# Patient Record
Sex: Male | Born: 1960 | Race: Black or African American | Hispanic: No | Marital: Married | Smoking: Former smoker
Health system: Southern US, Community
[De-identification: ages and names within clinical notes are randomized; demographics above are authoritative.]

## PROBLEM LIST (undated history)

## (undated) DIAGNOSIS — I509 Heart failure, unspecified: Secondary | ICD-10-CM

## (undated) DIAGNOSIS — E785 Hyperlipidemia, unspecified: Secondary | ICD-10-CM

## (undated) DIAGNOSIS — J189 Pneumonia, unspecified organism: Secondary | ICD-10-CM

## (undated) DIAGNOSIS — I10 Essential (primary) hypertension: Secondary | ICD-10-CM

## (undated) DIAGNOSIS — E119 Type 2 diabetes mellitus without complications: Secondary | ICD-10-CM

## (undated) HISTORY — PX: NO PAST SURGERIES: SHX2092

---

## 2002-11-24 ENCOUNTER — Emergency Department (HOSPITAL_COMMUNITY): Admission: EM | Admit: 2002-11-24 | Discharge: 2002-11-24 | Payer: Self-pay | Admitting: Internal Medicine

## 2005-01-26 ENCOUNTER — Emergency Department (HOSPITAL_COMMUNITY): Admission: EM | Admit: 2005-01-26 | Discharge: 2005-01-26 | Payer: Self-pay | Admitting: Emergency Medicine

## 2005-06-11 ENCOUNTER — Emergency Department (HOSPITAL_COMMUNITY): Admission: EM | Admit: 2005-06-11 | Discharge: 2005-06-11 | Payer: Self-pay | Admitting: Emergency Medicine

## 2005-11-03 ENCOUNTER — Emergency Department (HOSPITAL_COMMUNITY): Admission: EM | Admit: 2005-11-03 | Discharge: 2005-11-03 | Payer: Self-pay | Admitting: Emergency Medicine

## 2006-04-14 ENCOUNTER — Encounter (INDEPENDENT_AMBULATORY_CARE_PROVIDER_SITE_OTHER): Payer: Self-pay | Admitting: Family Medicine

## 2006-04-14 LAB — CONVERTED CEMR LAB: Blood Glucose, Fasting: 118 mg/dL

## 2006-04-24 ENCOUNTER — Encounter (INDEPENDENT_AMBULATORY_CARE_PROVIDER_SITE_OTHER): Payer: Self-pay | Admitting: Family Medicine

## 2006-04-24 LAB — CONVERTED CEMR LAB: PSA: 1.22 ng/mL

## 2006-08-04 ENCOUNTER — Ambulatory Visit: Payer: Self-pay | Admitting: Family Medicine

## 2006-09-01 ENCOUNTER — Ambulatory Visit: Payer: Self-pay | Admitting: Family Medicine

## 2006-10-12 ENCOUNTER — Encounter: Payer: Self-pay | Admitting: Family Medicine

## 2006-10-12 DIAGNOSIS — I10 Essential (primary) hypertension: Secondary | ICD-10-CM | POA: Insufficient documentation

## 2006-10-12 DIAGNOSIS — E782 Mixed hyperlipidemia: Secondary | ICD-10-CM | POA: Insufficient documentation

## 2006-10-12 DIAGNOSIS — K449 Diaphragmatic hernia without obstruction or gangrene: Secondary | ICD-10-CM | POA: Insufficient documentation

## 2006-10-12 DIAGNOSIS — E785 Hyperlipidemia, unspecified: Secondary | ICD-10-CM | POA: Insufficient documentation

## 2006-10-12 DIAGNOSIS — K219 Gastro-esophageal reflux disease without esophagitis: Secondary | ICD-10-CM | POA: Insufficient documentation

## 2006-10-12 DIAGNOSIS — E669 Obesity, unspecified: Secondary | ICD-10-CM | POA: Insufficient documentation

## 2006-11-03 ENCOUNTER — Ambulatory Visit: Payer: Self-pay | Admitting: Family Medicine

## 2007-01-15 ENCOUNTER — Encounter (INDEPENDENT_AMBULATORY_CARE_PROVIDER_SITE_OTHER): Payer: Self-pay | Admitting: Family Medicine

## 2007-01-24 ENCOUNTER — Encounter (INDEPENDENT_AMBULATORY_CARE_PROVIDER_SITE_OTHER): Payer: Self-pay | Admitting: Family Medicine

## 2007-03-13 ENCOUNTER — Encounter (INDEPENDENT_AMBULATORY_CARE_PROVIDER_SITE_OTHER): Payer: Self-pay | Admitting: Family Medicine

## 2007-03-16 ENCOUNTER — Ambulatory Visit: Payer: Self-pay | Admitting: Family Medicine

## 2007-03-16 LAB — CONVERTED CEMR LAB
Cholesterol, target level: 200 mg/dL
HDL goal, serum: 40 mg/dL
LDL Goal: 160 mg/dL

## 2007-03-19 LAB — CONVERTED CEMR LAB
ALT: 21 units/L (ref 0–53)
AST: 20 units/L (ref 0–37)
Albumin: 4 g/dL (ref 3.5–5.2)
Alkaline Phosphatase: 70 units/L (ref 39–117)
BUN: 17 mg/dL (ref 6–23)
CO2: 20 meq/L (ref 19–32)
Calcium: 9.8 mg/dL (ref 8.4–10.5)
Chloride: 107 meq/L (ref 96–112)
Creatinine, Ser: 1.19 mg/dL (ref 0.40–1.50)
Glucose, Bld: 104 mg/dL — ABNORMAL HIGH (ref 70–99)
PSA: 1.11 ng/mL (ref 0.10–4.00)
Potassium: 3.8 meq/L (ref 3.5–5.3)
Sodium: 141 meq/L (ref 135–145)
TSH: 2.203 microintl units/mL (ref 0.350–5.50)
Total Bilirubin: 0.3 mg/dL (ref 0.3–1.2)
Total Protein: 7 g/dL (ref 6.0–8.3)

## 2007-04-20 ENCOUNTER — Ambulatory Visit: Payer: Self-pay | Admitting: Family Medicine

## 2007-06-22 ENCOUNTER — Ambulatory Visit: Payer: Self-pay | Admitting: Family Medicine

## 2007-06-28 ENCOUNTER — Encounter (INDEPENDENT_AMBULATORY_CARE_PROVIDER_SITE_OTHER): Payer: Self-pay | Admitting: Family Medicine

## 2007-06-29 ENCOUNTER — Telehealth (INDEPENDENT_AMBULATORY_CARE_PROVIDER_SITE_OTHER): Payer: Self-pay | Admitting: *Deleted

## 2007-06-29 LAB — CONVERTED CEMR LAB
ALT: 17 units/L (ref 0–53)
AST: 16 units/L (ref 0–37)
Albumin: 4 g/dL (ref 3.5–5.2)
Alkaline Phosphatase: 61 units/L (ref 39–117)
BUN: 18 mg/dL (ref 6–23)
CO2: 25 meq/L (ref 19–32)
Calcium: 9.8 mg/dL (ref 8.4–10.5)
Chloride: 105 meq/L (ref 96–112)
Cholesterol: 140 mg/dL (ref 0–200)
Creatinine, Ser: 1.13 mg/dL (ref 0.40–1.50)
Glucose, Bld: 79 mg/dL (ref 70–99)
HDL: 43 mg/dL (ref >39)
LDL Cholesterol: 75 mg/dL (ref 0–99)
Potassium: 4.2 meq/L (ref 3.5–5.3)
Sodium: 143 meq/L (ref 135–145)
Total Bilirubin: 0.5 mg/dL (ref 0.3–1.2)
Total CHOL/HDL Ratio: 3.3
Total Protein: 7 g/dL (ref 6.0–8.3)
Triglycerides: 109 mg/dL (ref <150)
VLDL: 22 mg/dL (ref 0–40)

## 2007-09-08 ENCOUNTER — Emergency Department (HOSPITAL_COMMUNITY): Admission: EM | Admit: 2007-09-08 | Discharge: 2007-09-08 | Payer: Self-pay | Admitting: Emergency Medicine

## 2007-09-21 ENCOUNTER — Ambulatory Visit: Payer: Self-pay | Admitting: Family Medicine

## 2007-09-21 DIAGNOSIS — M109 Gout, unspecified: Secondary | ICD-10-CM | POA: Insufficient documentation

## 2007-09-21 LAB — CONVERTED CEMR LAB: LDL Goal: 130 mg/dL

## 2007-09-22 ENCOUNTER — Encounter (INDEPENDENT_AMBULATORY_CARE_PROVIDER_SITE_OTHER): Payer: Self-pay | Admitting: Family Medicine

## 2007-09-24 ENCOUNTER — Telehealth (INDEPENDENT_AMBULATORY_CARE_PROVIDER_SITE_OTHER): Payer: Self-pay | Admitting: Family Medicine

## 2007-09-24 LAB — CONVERTED CEMR LAB: Uric Acid, Serum: 9.4 mg/dL — ABNORMAL HIGH (ref 2.4–7.0)

## 2007-12-21 ENCOUNTER — Ambulatory Visit: Payer: Self-pay | Admitting: Family Medicine

## 2007-12-22 ENCOUNTER — Encounter (INDEPENDENT_AMBULATORY_CARE_PROVIDER_SITE_OTHER): Payer: Self-pay | Admitting: Family Medicine

## 2007-12-25 ENCOUNTER — Telehealth (INDEPENDENT_AMBULATORY_CARE_PROVIDER_SITE_OTHER): Payer: Self-pay | Admitting: *Deleted

## 2007-12-25 LAB — CONVERTED CEMR LAB
ALT: 18 units/L (ref 0–53)
AST: 19 units/L (ref 0–37)
Albumin: 4.1 g/dL (ref 3.5–5.2)
Alkaline Phosphatase: 64 units/L (ref 39–117)
BUN: 17 mg/dL (ref 6–23)
Basophils Absolute: 0 10*3/uL (ref 0.0–0.1)
Basophils Relative: 0 % (ref 0–1)
CO2: 26 meq/L (ref 19–32)
Calcium: 10.2 mg/dL (ref 8.4–10.5)
Chloride: 103 meq/L (ref 96–112)
Cholesterol: 154 mg/dL (ref 0–200)
Creatinine, Ser: 1.17 mg/dL (ref 0.40–1.50)
Eosinophils Absolute: 0.2 10*3/uL (ref 0.0–0.7)
Eosinophils Relative: 3 % (ref 0–5)
Glucose, Bld: 90 mg/dL (ref 70–99)
HCT: 46.4 % (ref 39.0–52.0)
HDL: 41 mg/dL (ref >39)
Hemoglobin: 14.3 g/dL (ref 13.0–17.0)
LDL Cholesterol: 77 mg/dL (ref 0–99)
Lymphocytes Relative: 40 % (ref 12–46)
Lymphs Abs: 3 10*3/uL (ref 0.7–4.0)
MCHC: 30.8 g/dL (ref 30.0–36.0)
MCV: 96.7 fL (ref 78.0–100.0)
Monocytes Absolute: 0.6 10*3/uL (ref 0.1–1.0)
Monocytes Relative: 8 % (ref 3–12)
Neutro Abs: 3.7 10*3/uL (ref 1.7–7.7)
Neutrophils Relative %: 50 % (ref 43–77)
Platelets: 172 10*3/uL (ref 150–400)
Potassium: 4.1 meq/L (ref 3.5–5.3)
RBC: 4.8 M/uL (ref 4.22–5.81)
RDW: 13.9 % (ref 11.5–15.5)
Sodium: 140 meq/L (ref 135–145)
TSH: 2.134 microintl units/mL (ref 0.350–5.50)
Total Bilirubin: 0.5 mg/dL (ref 0.3–1.2)
Total CHOL/HDL Ratio: 3.8
Total Protein: 7.4 g/dL (ref 6.0–8.3)
Triglycerides: 180 mg/dL — ABNORMAL HIGH (ref <150)
VLDL: 36 mg/dL (ref 0–40)
WBC: 7.5 10*3/uL (ref 4.0–10.5)

## 2008-01-01 ENCOUNTER — Encounter (INDEPENDENT_AMBULATORY_CARE_PROVIDER_SITE_OTHER): Payer: Self-pay | Admitting: Family Medicine

## 2008-01-04 ENCOUNTER — Encounter (INDEPENDENT_AMBULATORY_CARE_PROVIDER_SITE_OTHER): Payer: Self-pay | Admitting: Family Medicine

## 2008-01-05 ENCOUNTER — Emergency Department (HOSPITAL_COMMUNITY): Admission: EM | Admit: 2008-01-05 | Discharge: 2008-01-05 | Payer: Self-pay | Admitting: Emergency Medicine

## 2008-01-26 ENCOUNTER — Ambulatory Visit: Admission: RE | Admit: 2008-01-26 | Discharge: 2008-01-26 | Payer: Self-pay | Admitting: Family Medicine

## 2008-01-31 ENCOUNTER — Telehealth (INDEPENDENT_AMBULATORY_CARE_PROVIDER_SITE_OTHER): Payer: Self-pay | Admitting: Family Medicine

## 2008-02-12 ENCOUNTER — Ambulatory Visit: Payer: Self-pay | Admitting: Pulmonary Disease

## 2008-02-26 ENCOUNTER — Telehealth (INDEPENDENT_AMBULATORY_CARE_PROVIDER_SITE_OTHER): Payer: Self-pay | Admitting: *Deleted

## 2008-03-21 ENCOUNTER — Ambulatory Visit: Payer: Self-pay | Admitting: Family Medicine

## 2008-06-27 ENCOUNTER — Ambulatory Visit: Payer: Self-pay | Admitting: Family Medicine

## 2008-06-27 DIAGNOSIS — H6120 Impacted cerumen, unspecified ear: Secondary | ICD-10-CM | POA: Insufficient documentation

## 2008-06-27 LAB — CONVERTED CEMR LAB
Bilirubin Urine: NEGATIVE
Blood in Urine, dipstick: NEGATIVE
Glucose, Urine, Semiquant: NEGATIVE
Ketones, urine, test strip: NEGATIVE
Nitrite: NEGATIVE
Protein, U semiquant: NEGATIVE
Specific Gravity, Urine: 1.02
Urobilinogen, UA: 2
WBC Urine, dipstick: NEGATIVE
pH: 5

## 2008-06-28 ENCOUNTER — Encounter (INDEPENDENT_AMBULATORY_CARE_PROVIDER_SITE_OTHER): Payer: Self-pay | Admitting: Family Medicine

## 2008-06-30 LAB — CONVERTED CEMR LAB
ALT: 21 units/L (ref 0–53)
AST: 19 units/L (ref 0–37)
Albumin: 4.1 g/dL (ref 3.5–5.2)
Alkaline Phosphatase: 61 units/L (ref 39–117)
BUN: 16 mg/dL (ref 6–23)
Basophils Absolute: 0 10*3/uL (ref 0.0–0.1)
Basophils Relative: 0 % (ref 0–1)
CO2: 26 meq/L (ref 19–32)
Calcium: 9.5 mg/dL (ref 8.4–10.5)
Chloride: 103 meq/L (ref 96–112)
Cholesterol: 161 mg/dL (ref 0–200)
Creatinine, Ser: 1.21 mg/dL (ref 0.40–1.50)
Eosinophils Absolute: 0.2 10*3/uL (ref 0.0–0.7)
Eosinophils Relative: 3 % (ref 0–5)
Glucose, Bld: 94 mg/dL (ref 70–99)
HCT: 46.3 % (ref 39.0–52.0)
HDL: 43 mg/dL (ref >39)
Hemoglobin: 14.9 g/dL (ref 13.0–17.0)
LDL Cholesterol: 92 mg/dL (ref 0–99)
Lymphocytes Relative: 38 % (ref 12–46)
Lymphs Abs: 2.9 10*3/uL (ref 0.7–4.0)
MCHC: 32.2 g/dL (ref 30.0–36.0)
MCV: 96.7 fL (ref 78.0–100.0)
Monocytes Absolute: 0.8 10*3/uL (ref 0.1–1.0)
Monocytes Relative: 11 % (ref 3–12)
Neutro Abs: 3.6 10*3/uL (ref 1.7–7.7)
Neutrophils Relative %: 48 % (ref 43–77)
PSA: 1.15 ng/mL (ref 0.10–4.00)
Platelets: 162 10*3/uL (ref 150–400)
Potassium: 4 meq/L (ref 3.5–5.3)
RBC: 4.79 M/uL (ref 4.22–5.81)
RDW: 14.3 % (ref 11.5–15.5)
Sodium: 142 meq/L (ref 135–145)
Total Bilirubin: 0.6 mg/dL (ref 0.3–1.2)
Total CHOL/HDL Ratio: 3.7
Total Protein: 7.2 g/dL (ref 6.0–8.3)
Triglycerides: 131 mg/dL (ref <150)
VLDL: 26 mg/dL (ref 0–40)
WBC: 7.5 10*3/uL (ref 4.0–10.5)

## 2008-08-29 ENCOUNTER — Ambulatory Visit: Payer: Self-pay | Admitting: Family Medicine

## 2008-08-29 DIAGNOSIS — F528 Other sexual dysfunction not due to a substance or known physiological condition: Secondary | ICD-10-CM | POA: Insufficient documentation

## 2008-11-28 ENCOUNTER — Ambulatory Visit: Payer: Self-pay | Admitting: Family Medicine

## 2008-12-02 LAB — CONVERTED CEMR LAB
ALT: 21 units/L (ref 0–53)
AST: 21 units/L (ref 0–37)
Albumin: 4.4 g/dL (ref 3.5–5.2)
Alkaline Phosphatase: 72 units/L (ref 39–117)
BUN: 17 mg/dL (ref 6–23)
Basophils Absolute: 0 10*3/uL (ref 0.0–0.1)
Basophils Relative: 0 % (ref 0–1)
CO2: 24 meq/L (ref 19–32)
Calcium: 9.9 mg/dL (ref 8.4–10.5)
Chloride: 103 meq/L (ref 96–112)
Creatinine, Ser: 1.04 mg/dL (ref 0.40–1.50)
Eosinophils Absolute: 0.3 10*3/uL (ref 0.0–0.7)
Eosinophils Relative: 3 % (ref 0–5)
Glucose, Bld: 88 mg/dL (ref 70–99)
HCT: 45.1 % (ref 39.0–52.0)
Hemoglobin: 15.2 g/dL (ref 13.0–17.0)
Lymphocytes Relative: 37 % (ref 12–46)
Lymphs Abs: 3.5 10*3/uL (ref 0.7–4.0)
MCHC: 33.7 g/dL (ref 30.0–36.0)
MCV: 94 fL (ref 78.0–100.0)
Monocytes Absolute: 0.8 10*3/uL (ref 0.1–1.0)
Monocytes Relative: 8 % (ref 3–12)
Neutro Abs: 4.8 10*3/uL (ref 1.7–7.7)
Neutrophils Relative %: 51 % (ref 43–77)
Platelets: 174 10*3/uL (ref 150–400)
Potassium: 3.9 meq/L (ref 3.5–5.3)
RBC: 4.8 M/uL (ref 4.22–5.81)
RDW: 13.5 % (ref 11.5–15.5)
Sodium: 141 meq/L (ref 135–145)
Total Bilirubin: 0.4 mg/dL (ref 0.3–1.2)
Total Protein: 7.7 g/dL (ref 6.0–8.3)
Uric Acid, Serum: 7.7 mg/dL (ref 4.0–7.8)
WBC: 9.4 10*3/uL (ref 4.0–10.5)

## 2009-01-24 ENCOUNTER — Emergency Department (HOSPITAL_COMMUNITY): Admission: EM | Admit: 2009-01-24 | Discharge: 2009-01-24 | Payer: Self-pay | Admitting: Emergency Medicine

## 2009-04-10 ENCOUNTER — Ambulatory Visit: Payer: Self-pay | Admitting: Family Medicine

## 2009-04-11 ENCOUNTER — Encounter (INDEPENDENT_AMBULATORY_CARE_PROVIDER_SITE_OTHER): Payer: Self-pay | Admitting: Family Medicine

## 2009-04-13 LAB — CONVERTED CEMR LAB
ALT: 20 units/L (ref 0–53)
AST: 19 units/L (ref 0–37)
Albumin: 4.2 g/dL (ref 3.5–5.2)
Alkaline Phosphatase: 66 units/L (ref 39–117)
BUN: 18 mg/dL (ref 6–23)
Basophils Absolute: 0 10*3/uL (ref 0.0–0.1)
Basophils Relative: 0 % (ref 0–1)
CO2: 20 meq/L (ref 19–32)
Calcium: 9.8 mg/dL (ref 8.4–10.5)
Chloride: 108 meq/L (ref 96–112)
Creatinine, Ser: 1.16 mg/dL (ref 0.40–1.50)
Eosinophils Absolute: 0.2 10*3/uL (ref 0.0–0.7)
Eosinophils Relative: 2 % (ref 0–5)
Glucose, Bld: 102 mg/dL — ABNORMAL HIGH (ref 70–99)
HCT: 43.4 % (ref 39.0–52.0)
Hemoglobin: 14.3 g/dL (ref 13.0–17.0)
Lymphocytes Relative: 35 % (ref 12–46)
Lymphs Abs: 3.4 10*3/uL (ref 0.7–4.0)
MCHC: 32.9 g/dL (ref 30.0–36.0)
MCV: 91.4 fL (ref 78.0–100.0)
Monocytes Absolute: 0.7 10*3/uL (ref 0.1–1.0)
Monocytes Relative: 7 % (ref 3–12)
Neutro Abs: 5.3 10*3/uL (ref 1.7–7.7)
Neutrophils Relative %: 55 % (ref 43–77)
Platelets: 157 10*3/uL (ref 150–400)
Potassium: 3.7 meq/L (ref 3.5–5.3)
RBC: 4.75 M/uL (ref 4.22–5.81)
RDW: 13.6 % (ref 11.5–15.5)
Sodium: 142 meq/L (ref 135–145)
Total Bilirubin: 0.3 mg/dL (ref 0.3–1.2)
Total Protein: 7.5 g/dL (ref 6.0–8.3)
Uric Acid, Serum: 9.6 mg/dL — ABNORMAL HIGH (ref 4.0–7.8)
WBC: 9.6 10*3/uL (ref 4.0–10.5)

## 2009-07-17 ENCOUNTER — Ambulatory Visit: Payer: Self-pay | Admitting: Family Medicine

## 2009-11-07 ENCOUNTER — Emergency Department (HOSPITAL_COMMUNITY): Admission: EM | Admit: 2009-11-07 | Discharge: 2009-11-07 | Payer: Self-pay | Admitting: Emergency Medicine

## 2010-12-07 NOTE — Assessment & Plan Note (Signed)
Summary: follow up 1 month/slj   Vital Signs:  Patient Profile:   50 Years Old Male Height:     68 inches (172.72 cm) Weight:      276 pounds BMI:     42.12 O2 Sat:      99 % Pulse rate:   81 / minute Resp:     12 per minute BP sitting:   131 / 81  Vitals Entered By: Sherilyn Banker (November 28, 2008 3:51 PM)                 PCP:  Franchot Heidelberg, MD  Chief Complaint:  sinus congestion, ear pain, and sore throat.  History of Present Illness: Pt in for recheck.  He notes he is doing well. He states has a cold. He states started Monday. He states he has low grade temp and has not checked it but states felt hot. He denies chills and sweats. He has dull headache and notes all over. Ears are stuffy and achy. His nose is congested with yellow mucous. He denies watery eyes and has sneezed a little bit. He has PND and states tickle in throat. He is coughing and states sma estuff as in nose comes up. Felt aweful yesterday and better today. He denies chest pain, SOB, orthoonea, PND and palpitations. Denies leg swelling. He denies wheezing. He has a good apetite. He has no nausea or vomitting. He denies diarrhea and constipation. No urinary symptoms. No ill contacts. No travel. He has self treated with Alka Selzter cold and Tylnol and states feels better tofay than he has all week.   He now presents.  Hypertension History:      See HPI.        Positive major cardiovascular risk factors include male age 64 years old or older, hyperlipidemia, and hypertension.  Negative major cardiovascular risk factors include no history of diabetes, negative family history for ischemic heart disease, and non-tobacco-user status.        Further assessment for target organ damage reveals no history of ASHD, stroke/TIA, or peripheral vascular disease.    Lipid Management History:      Positive NCEP/ATP III risk factors include male age 14 years old or older and hypertension.  Negative NCEP/ATP III risk factors  include non-diabetic, no family history for ischemic heart disease, non-tobacco-user status, no ASHD (atherosclerotic heart disease), no prior stroke/TIA, no peripheral vascular disease, and no history of aortic aneurysm.        The patient states that he knows about the "Therapeutic Lifestyle Change" diet.  His compliance with the TLC diet is fair.  The patient expresses understanding of adjunctive measures for cholesterol lowering.  Adjunctive measures started by the patient include aerobic exercise and fiber.  He expresses no side effects from his lipid-lowering medication.  The patient denies any symptoms to suggest myopathy or liver disease from his "statin" therapy.  Comments: He is exersizing and walks 30 minutes a week. .      Prior Medications Reviewed Using: Patient Recall  Updated Prior Medication List: LOVASTATIN 20 MG TABS (LOVASTATIN) once daily ZESTORETIC 20-12.5 MG  TABS (LISINOPRIL-HYDROCHLOROTHIAZIDE) One daily INDOMETHACIN 25 MG  CAPS (INDOMETHACIN) two times a day for acute gout only  Current Allergies (reviewed today): No known allergies     Risk Factors: Tobacco use:  never Drug use:  no Alcohol use:  no  Family History Risk Factors:    Family History of MI in females < 19 years old:  no  Family History of MI in males < 73 years old:  no   Review of Systems      See HPI  General      See HPI  CV      See HPI  Resp      See HPI  GI      See HPI  GU      Denies nocturia, urinary frequency, and urinary hesitancy.  MS      Gout doig well.May use Indomethacin every other month for 2 to 3 days. Avoids ETOH. he does eat some red meat. Small amounts though.   Physical Exam  General:     Well-developed,well-nourished,in no acute distress; alert,appropriate and cooperative throughout examination.Obese. Head:     Normocephalic and atraumatic without obvious abnormalities. No apparent alopecia or balding. Eyes:     No corneal or conjunctival  inflammation noted. EOMI. Perrla.  Ears:     Bilateral cerumen impaction with soft sticky brown wax. Unable to see TMs related to significant quanity. Used currette and some wax removed. However, waxdry and very hard and due to impaction had to do lavage.  He requested removal as well as he reports hearing getting worse again. This done TMs mildy injected without effusion and no signs of OM or OE. Nose:     Mod congestion. Clear. Mouth:     Oral mucosa and oropharynx without lesions or exudates.  . Neck:     No deformities, masses, or tenderness noted. Lungs:     Normal respiratory effort, chest expands symmetrically. Lungs are clear to auscultation, no crackles or wheezes. Heart:     Normal rate and regular rhythm. S1 and S2 normal without gallop, murmur, click, rub or other extra sounds. Abdomen:     Bowel sounds positive,abdomen soft and non-tender without masses, organomegaly or hernias noted. Extremities:     No clubbing, cyanosis, edema, or deformity noted with normal full range of motion of all joints.   Cervical Nodes:     No lymphadenopathy noted Psych:     Cognition and judgment appear intact. Alert and cooperative with normal attention span and concentration. No apparent delusions, illusions, hallucinations \   Impression & Recommendations:  Problem # 1:  VIRAL URI (ICD-465.9) Sx Rx only. Trial Lodrane D one daily fo 6 days. May use nasal saline flushes. Update if persist or worsen. Educated why abx not indicated and agrees. His updated medication list for this problem includes:    Indomethacin 25 Mg Caps (Indomethacin) .Marland Kitchen..Marland Kitchen Two times a day for acute gout only   Problem # 2:  CERUMEN IMPACTION, BILATERAL (ICD-380.4) See Exam. Educated on what cerumen does and need to avoid all qtips etc in ear canal. May ally mineral oil to rediuce wax and try OTC Debrox if needed. Agrees. Orders: Cerumen Impaction Removal ([id number])   Problem # 3:  HYPERTENSION (ICD-401.9) BP stable.  Rx as below. Limit salt,exersize and lose weight. Check renal fucntion and lytes. His updated medication list for this problem includes:    Zestoretic 20-12.5 Mg Tabs (Lisinopril-hydrochlorothiazide) ..... One daily  Orders: T-Comprehensive Metabolic Panel [id number]) Venipuncture ([id number])   Problem # 4:  HYPERLIPIDEMIA (ICD-272.4) Statin as is. Again advised obesity improvement with TLC. Check LFTS on high risk med. His updated medication list for this problem includes:    Lovastatin 20 Mg Tabs (Lovastatin) ..... Once daily  Orders: T-Comprehensive Metabolic Panel [id number]) Venipuncture ([id number])   Problem # 5:  GOUT (ICD-274.9) Two monthly 3 day flares. Again  councelled avoidance of precipitators. It has been a while since had a uric acid done and thus we willobtain this. Cont Indomethacin cautiously with flare and councelled GI precautions. Check renal function and HGB on this.  His updated medication list for this problem includes:    Indomethacin 25 Mg Caps (Indomethacin) .Marland Kitchen..Marland Kitchen Two times a day for acute gout only  Orders: T-Uric Acid (Blood) ([id number]) Venipuncture ([id number])   Problem # 6:  Preventive Health Care (ICD-V70.0) Advised H1N1 Vaccine. Advised risk and benefit and declines.  Aware of risk.  Complete Medication List: 1)  Lovastatin 20 Mg Tabs (Lovastatin) .... Once daily 2)  Zestoretic 20-12.5 Mg Tabs (Lisinopril-hydrochlorothiazide) .... One daily 3)  Indomethacin 25 Mg Caps (Indomethacin) .... Two times a day for acute gout only  Other Orders: T-CBC w/Diff ([id number])  Hypertension Assessment/Plan:      The patient's hypertensive risk group is category B: At least one risk factor (excluding diabetes) with no target organ damage.  His calculated 10 year risk of coronary heart disease is 4 %.  Today's blood pressure is 131/81.  His blood pressure goal is < 140/90.  Lipid Assessment/Plan:      Based on NCEP/ATP III, the patient's risk factor category is  "2 or more risk factors and a calculated 10 year CAD risk of < 20%".  From this information, the patient's calculated lipid goals are as follows: Total cholesterol goal is 200; LDL cholesterol goal is 130; HDL cholesterol goal is 40; Triglyceride goal is 150.     Patient Instructions: 1)  Please schedule a follow-up appointment in 3 months. I will call with lab results. See me sooner if any concerns.     Preventive Care Screening  Last Flu Shot:    Date:  11/28/2008    Next Due:  12/2009    Results:  Hx of flu shot

## 2010-12-07 NOTE — Letter (Signed)
Summary: LOVASTATIN REFILL  LOVASTATIN REFILL   Imported By: Magdalene River 01/24/2007 10:49:08  _____________________________________________________________________  External Attachment:    Type:   Image     Comment:   External Document

## 2010-12-07 NOTE — Progress Notes (Signed)
Summary: Sleep study referral  Phone Note Outgoing Call   Call placed by: Sonny Dandy,  January 31, 2008 3:09 PM Summary of Call: Respiratory called to check on sleep study, message left for patient to return call .................................................................Marland KitchenMarland KitchenBritt Bottom Long  January 31, 2008 3:10 PM   Bonita Quin returned call and said patient was done Monday and was sent to [facility] for reading. .................................................................Marland KitchenMarland KitchenSonny Dandy  January 31, 2008 4:51 PM  Initial call taken by: Sonny Dandy,  January 31, 2008 4:51 PM  Follow-up for Phone Call        Await result. Follow-up by: Franchot Heidelberg MD,  February 01, 2008 8:06 AM

## 2010-12-07 NOTE — Assessment & Plan Note (Signed)
Summary: follow up 1 month/slj   Vital Signs:  Patient Profile:   50 Years Old Male Height:     68 inches (172.72 cm) Weight:      277 pounds BMI:     42.27 O2 Sat:      100 % Temp:     97.8 degrees F Pulse rate:   84 / minute Resp:     14 per minute BP sitting:   137 / 81  Vitals Entered By: Sherilyn Banker (August 29, 2008 9:06 AM)                 PCP:  Franchot Heidelberg, MD  Chief Complaint:  follow up visit.  History of Present Illness: Pt in for recheck.  He notes he is doing well. He denies any concerns.   He has not had flu shot and states still contemplating this as he never gets sick.   He needs recheck on his HTN, LIpids and obesity. He had labs after last visit.This is reviewed:  1. CBC - normal 2. CMP - normal 3. Lipids - see below 4. PSA -  normal - denies dysuria and noctura. He denies fam hx of proate cancer.   He now presents.  Hypertension History:      He denies headache, chest pain, palpitations, dyspnea with exertion, orthopnea, PND, peripheral edema, visual symptoms, neurologic problems, syncope, and side effects from treatment.  He notes no problems with any antihypertensive medication side effects.  Further comments include: Taking meds daily. Watching salt. He is not exersizing - states life to busy. States has concern about sex. Has trouble getting erection in am. States wife has not complained but he feels more self conscious. Marland Kitchen        Positive major cardiovascular risk factors include male age 17 years old or older, hyperlipidemia, and hypertension.  Negative major cardiovascular risk factors include no history of diabetes, negative family history for ischemic heart disease, and non-tobacco-user status.        Further assessment for target organ damage reveals no history of ASHD, stroke/TIA, or peripheral vascular disease.    Lipid Management History:      Positive NCEP/ATP III risk factors include male age 65 years old or older and  hypertension.  Negative NCEP/ATP III risk factors include non-diabetic, no family history for ischemic heart disease, non-tobacco-user status, no ASHD (atherosclerotic heart disease), no prior stroke/TIA, no peripheral vascular disease, and no history of aortic aneurysm.        The patient states that he knows about the "Therapeutic Lifestyle Change" diet.  His compliance with the TLC diet is fair.  The patient expresses understanding of adjunctive measures for cholesterol lowering.  Adjunctive measures started by the patient include aerobic exercise, fiber, and omega-3 supplements.  He expresses no side effects from his lipid-lowering medication.  The patient denies any symptoms to suggest myopathy or liver disease from his "statin" therapy.  Comments: Taking RX as directed. Watching fat.      Prior Medications Reviewed Using: Patient Recall  Updated Prior Medication List: LOVASTATIN 20 MG TABS (LOVASTATIN) once daily ZESTORETIC 20-12.5 MG  TABS (LISINOPRIL-HYDROCHLOROTHIAZIDE) One daily INDOMETHACIN 25 MG  CAPS (INDOMETHACIN) two times a day for acute gout only  Current Allergies (reviewed today): No known allergies   Past Medical History:    Reviewed history from 03/21/2008 and no changes required:       Current Problems:        SLEEP APNEA, MILD - PSG  MARTCH 2009 (ICD-780.57)       MALAISE AND FATIGUE (ICD-780.79)       GOUT (ICD-274.9)       VARICOSE VEIN - RIGHT SHIN (ICD-456.8)       HIATAL HERNIA (ICD-553.3)       OBESITY NOS (ICD-278.00)       HYPERTENSION (ICD-401.9)       HYPERLIPIDEMIA (ICD-272.4)       GERD (ICD-530.81)         Past Surgical History:    Reviewed history from 03/16/2007 and no changes required:       Denies surgical history   Family History:    Reviewed history from 03/16/2007 and no changes required:       Ffather:Deceased; CVA 29       Mother: Deceased; DM 81       2 sisters: HTN; DM  age 83 and age 90       3 brothers: OA age 35, 84 and 36        1 child: healthy Girl - 73  Social History:    Reviewed history from 03/16/2007 and no changes required:       Married       Never Smoked       Alcohol use-no       Drug use-no       Occupation: Merchandiser, 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  Resp      Denies cough, shortness of breath, sputum productive, and wheezing.      Lots of dust at brick yard. Wears mask.  GI      Denies abdominal pain, constipation, diarrhea, nausea, and vomiting.  GU      Denies nocturia, urinary frequency, and urinary hesitancy.   Physical Exam  General:     Well-developed,well-nourished,in no acute distress; alert,appropriate and cooperative throughout examination.Obese. Lungs:     Normal respiratory effort, chest expands symmetrically. Lungs are clear to auscultation, no crackles or wheezes. Heart:     Normal rate and regular rhythm. S1 and S2 normal without gallop, murmur, click, rub or other extra sounds. Abdomen:     Bowel sounds positive,abdomen soft and non-tender without masses, organomegaly or hernias noted. Rectal:     No external abnormalities noted. Normal sphincter tone. No rectal masses or tenderness. Prostate:     Prostate gland firm and smooth, no enlargement, nodularity, tenderness, mass, asymmetry or induration. Extremities:     No clubbing, cyanosis, edema, or deformity noted with normal full range of motion of all joints.   Cervical Nodes:     No lymphadenopathy noted Psych:     Cognition and judgment appear intact. Alert and cooperative with normal attention span and concentration. No apparent delusions, illusions, hallucinations    Impression & Recommendations:  Problem # 1:  ERECTILE  DYSFUNCTION (ICD-302.72) Trial Viagra. Councelled risk and benefit. Advised adequate foreplay. Councelled safe sex. Update if good effect and qwill give script. Sample pack given today. If signs do not improve, check testosterone level.  Problem # 2:  OBESITY NOS (ICD-278.00) Councelled portion control,  exersize 3 times a week for . Recheck 3 months. Aware of morbity and mortality risk asscoiated.  Problem # 3:  HYPERTENSION (ICD-401.9) BP to goal. Limit salt. Lose weight. Recheck 3 months. His updated medication list for this problem includes:    Zestoretic 20-12.5 Mg Tabs (Lisinopril-hydrochlorothiazide) ..... One daily   Problem # 4:  HYPERLIPIDEMIA (ICD-272.4) Statin as is. TLC is a must. His updated medication list for this problem includes:    Lovastatin 20 Mg Tabs (Lovastatin) ..... Once daily   Problem # 5:  Preventive Health Care (ICD-V70.0) Advised flu and H1N1. Educated risk and beenfit. Declines. Update if changes mind. Normal PSA and rectal and reassured.  Complete Medication List: 1)  Lovastatin 20 Mg Tabs (Lovastatin) .... Once daily 2)  Zestoretic 20-12.5 Mg Tabs (Lisinopril-hydrochlorothiazide) .... One daily 3)  Indomethacin 25 Mg Caps (Indomethacin) .... Two times a day for acute gout only  Hypertension Assessment/Plan:      The patient's hypertensive risk group is category B: At least one risk factor (excluding diabetes) with no target organ damage.  His calculated 10 year risk of coronary heart disease is 4 %.  Today's blood pressure is 137/81.  His blood pressure goal is < 140/90.  Lipid Assessment/Plan:      Based on NCEP/ATP III, the patient's risk factor category is "2 or more risk factors and a calculated 10 year CAD risk of < 20%".  From this information, the patient's calculated lipid goals are as follows: Total cholesterol goal is 200; LDL cholesterol goal is 130; HDL cholesterol goal is 40; Triglyceride goal is 150.     Patient Instructions: 1)   Please schedule a follow-up appointment in 3 months.   ]

## 2010-12-07 NOTE — Progress Notes (Signed)
Summary: 09/21/2007 lab results  Phone Note Outgoing Call   Call placed by: Sonny Dandy,  September 24, 2007 3:57 PM Summary of Call: Results given to patient, voices understanding. Still c/o some discomfort and patient has no refills on med. Uses [facility] [place] .................................................................Marland KitchenMarland KitchenSonny Dandy  September 24, 2007 3:59 PM  Initial call taken by: Sonny Dandy,  September 24, 2007 3:59 PM  Follow-up for Phone Call        Call in Medrol pack as directed. Follow-up by: Franchot Heidelberg MD,  September 24, 2007 4:09 PM  Additional Follow-up for Phone Call Additional follow up Details #1::        called to [facility] and pt notified Additional Follow-up by: Sonny Dandy,  September 24, 2007 4:17 PM

## 2010-12-07 NOTE — Assessment & Plan Note (Signed)
Summary: 3 MONTH FOLLOW UP/ARC   Vital Signs:  Patient Profile:   50 Years Old Male Height:     68 inches (172.72 cm) Weight:      276 pounds BMI:     42.12 O2 Sat:      100 % Temp:     98.1 degrees F Pulse rate:   86 / minute Resp:     16 per minute BP sitting:   131 / 83  Vitals Entered By: Sherilyn Banker (June 27, 2008 3:51 PM)                 PCP:  Franchot Heidelberg, MD  Chief Complaint:  follow up visit.  History of Present Illness: Pt in for recheck.  He notes he is doing well.  He has mild sleep apnea. He has lost 5 pounds since June and has done well with walking - does daily and does . He eats more fruits and veggies and states has cut back on red meat. Alaso workingon portion size. He denies excessive malaise and fatigue and adds resting well.   His gout is doing great. He states single attack since last visit and took 4 doses of Indomethacoin and did well. States would like to follow.  Limiting red meat and avoids ETOH.  He has hd ringing in his ears and decreased hearing. Not using qtips and states hearing decreased as well. Denies any ear pain,drianage and discharge as well as runny nose and sore throat. He has not had any trouble with fever, chills or sweats. No hx of erumen impaction.  He now presents.  Hypertension History:      He denies headache, chest pain, palpitations, dyspnea with exertion, orthopnea, PND, peripheral edema, visual symptoms, neurologic problems, syncope, and side effects from treatment.  He notes no problems with any antihypertensive medication side effects.  Further comments include: Tolerating medications.        Positive major cardiovascular risk factors include male age 63 years old or older, hyperlipidemia, and hypertension.  Negative major cardiovascular risk factors include no history of diabetes, negative family history for ischemic heart disease, and non-tobacco-user status.        Further assessment for target organ  damage reveals no history of ASHD, stroke/TIA, or peripheral vascular disease.    Lipid Management History:      Positive NCEP/ATP III risk factors include male age 27 years old or older and hypertension.  Negative NCEP/ATP III risk factors include non-diabetic, no family history for ischemic heart disease, non-tobacco-user status, no ASHD (atherosclerotic heart disease), no prior stroke/TIA, no peripheral vascular disease, and no history of aortic aneurysm.        The patient states that he knows about the "Therapeutic Lifestyle Change" diet.  His compliance with the TLC diet is fair.  Adjunctive measures started by the patient include aerobic exercise, fiber, ASA, and omega-3 supplements.  He expresses no side effects from his lipid-lowering medication.  The patient denies any symptoms to suggest myopathy or liver disease from his "statin" therapy.  Comments: Agree with lipid rechek for trig.      Prior Medications Reviewed Using: Patient Recall  Updated Prior Medication List: LOVASTATIN 20 MG TABS (LOVASTATIN) once daily ZESTORETIC 20-12.5 MG  TABS (LISINOPRIL-HYDROCHLOROTHIAZIDE) One daily INDOMETHACIN 25 MG  CAPS (INDOMETHACIN) two times a day for acute gout only  Current Allergies (reviewed today): No known allergies   Past Medical History:    Reviewed history from 03/21/2008 and no changes required:  Current Problems:        SLEEP APNEA, MILD - PSG MARTCH 2009 (ICD-780.57)       MALAISE AND FATIGUE (ICD-780.79)       GOUT (ICD-274.9)       VARICOSE VEIN - RIGHT SHIN (ICD-456.8)       HIATAL HERNIA (ICD-553.3)       OBESITY NOS (ICD-278.00)       HYPERTENSION (ICD-401.9)       HYPERLIPIDEMIA (ICD-272.4)       GERD (ICD-530.81)         Past Surgical History:    Reviewed history from 03/16/2007 and no changes required:       Denies surgical history   Family History:    Reviewed history from 03/16/2007 and no changes required:       Ffather:Deceased; CVA 14        Mother: Deceased; DM 76       2 sisters: HTN; DM        3 brothers: OA       1 child: healthy Girl  Social History:    Reviewed history from 03/16/2007 and no changes required:       Married       Never Smoked       Alcohol use-no       Drug use-no       Occupation: Merchandiser, 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 Ears:     Bilateral cerumen impaction with hard brown wax. Unable to see TMs related to significant quanity. Used currette and some wax removed. However, waxdry and very hard and due to impaction had to do lavage. responded well and once wax removed tinnitus cleared. Lungs:     Normal respiratory effort, chest expands symmetrically. Lungs are clear to auscultation, no crackles or wheezes. Heart:     Normal rate and regular rhythm. S1 and S2 normal without gallop, murmur, click, rub or other extra sounds. Abdomen:     Bowel sounds positive,abdomen soft and non-tender without masses, organomegaly or hernias noted. Extremities:     No clubbing, cyanosis, edema, or deformity noted with normal full range of motion of all joints.   Cervical Nodes:     No lymphadenopathy noted Psych:     Cognition and judgment appear intact. Alert and cooperative with normal attention span and concentration. No apparent delusions, illusions, hallucinations    Impression & Recommendations:  Problem # 1:  SLEEP APNEA, MILD - PSG MARTCH 2009 (ICD-780.57) See below. Cont TLC only for now and recheck in one month to assure weight loss cont. Aim for 4 pounds in 4 weeks.  Problem # 2:  OBESITY NOS (ICD-278.00) Happy with weight  loss and encouraged to stay course.  Problem # 3:  HYPERTENSION (ICD-401.9) Stable. No med chnages. Cont TLC and Rx as is. Labs as below. His updated medication list for this problem includes:    Zestoretic 20-12.5 Mg Tabs (Lisinopril-hydrochlorothiazide) ..... One daily  Orders: Urinalysis-dipstick only (Medicare patient) [phone]) T-Comprehensive Metabolic Panel [phone])   Problem #  4:  HYPERLIPIDEMIA (ICD-272.4) repeat and optomizeTrig. His updated medication list for this problem includes:    Lovastatin 20 Mg Tabs (Lovastatin) ..... Once daily  Orders: T-Comprehensive Metabolic Panel [phone]) T-Lipid Profile ([id number])   Problem # 5:  GOUT (ICD-274.9) Stable. Rx asis. Check rnal function and CBC to assure no side-effects on longterm Rx of high risk meds. His updated medication list for this problem includes:    Indomethacin 25 Mg Caps (Indomethacin) .Marland Kitchen..Marland Kitchen Two times a day for acute gout only   Problem # 6:  CERUMEN IMPACTION, BILATERAL (ICD-380.4) See above. Councelled improtance of ear wax and avoidance of self removal. Orders: Cerumen Impaction Removal ([id number])   Problem # 7:  SPECIAL SCREENING MALIGNANT NEOPLASM OF PROSTATE (ICD-V76.44) PSA to be drawn and rectal on return.Advised risk and benefit and agreeable. Orders: T-PSA  ([id number])   Complete Medication List: 1)  Lovastatin 20 Mg Tabs (Lovastatin) .... Once daily 2)  Zestoretic 20-12.5 Mg Tabs (Lisinopril-hydrochlorothiazide) .... One daily 3)  Indomethacin 25 Mg Caps (Indomethacin) .... Two times a day for acute gout only  Other Orders: T-CBC w/Diff ([id number])  Hypertension Assessment/Plan:      The patient's hypertensive risk group is category B: At least one risk factor (excluding diabetes) with no target organ damage.  His calculated 10 year risk of coronary heart disease is 4 %.  Today's blood pressure is 131/83.  His blood pressure goal is < 140/90.  Lipid Assessment/Plan:       Based on NCEP/ATP III, the patient's risk factor category is "2 or more risk factors and a calculated 10 year CAD risk of < 20%".  From this information, the patient's calculated lipid goals are as follows: Total cholesterol goal is 200; LDL cholesterol goal is 130; HDL cholesterol goal is 40; Triglyceride goal is 150.     Patient Instructions: 1)  Please schedule a follow-up appointment in 1 month.   ]   Laboratory Results   Urine Tests    Routine Urinalysis   Color: yellow Appearance: Clear Glucose: negative   (Normal Range: Negative) Bilirubin: negative   (Normal Range: Negative) Ketone: negative   (Normal Range: Negative) Spec. Gravity: 1.020   (Normal Range: 1.003-1.035) Blood: negative   (Normal Range: Negative) pH: 5.0   (Normal Range: 5.0-8.0) Protein: negative   (Normal Range: Negative) Urobilinogen: 2.0   (Normal Range: 0-1) Nitrite: negative   (Normal Range: Negative) Leukocyte Esterace: negative   (Normal Range: Negative)

## 2010-12-07 NOTE — Letter (Signed)
Summary: Historic RMA chart  Historic RMA chart   Imported By: Curtis Sites 08/27/2009 09:59:44  _____________________________________________________________________  External Attachment:    Type:   Image     Comment:   External Document

## 2010-12-07 NOTE — Progress Notes (Signed)
Summary: 01/26/08 sleep study result  Phone Note Outgoing Call   Call placed by: Sonny Dandy,  February 26, 2008 1:43 PM Summary of Call: .results given to patient and he will use his 03/21/08 appointment for the follow up Initial call taken by: Sonny Dandy,  February 26, 2008 1:44 PM

## 2010-12-07 NOTE — Assessment & Plan Note (Signed)
Summary: FOLLOW UP 3 MONTH/SLJ   Vital Signs:  Patient profile:   50 year old male Height:      68 inches Weight:      278 pounds BMI:     42.42 O2 Sat:      98 % Temp:     98.1 degrees F Pulse rate:   78 / minute Resp:     14 per minute BP sitting:   131 / 88  Vitals Entered By: Sherilyn Banker LPN (April 10, 9810 3:53 PM)  Nutrition Counseling: Patient's BMI is greater than 25 and therefore counseled on weight management options.  Primary Provider:  Franchot Heidelberg, MD  CC:  Recheck.  History of Present Illness: Pt in for recheck.  He states he has left ear fullness and discomfort. He states hearing fiar but not as good as he would like. He has hx of cerumen imapction and avoids qtips. He denies sneezing, runny nose and sore throat. He denies fever, chills and sweats.   He states his gout is doing well. He had single gout attck a month ago and was given medicine. He cannot recall. See Debera Lat for details and worked well. He ate more port than usual and blames this as cause. Has one to two attacks a year. He states Indomethacin just did jot cut it. He had uric last January and this was 7.7. Agreeable with  lab draw.  He needs recheck on HTN and Hyperlipidemia.  He now presents.  Hypertension History:      He denies headache, chest pain, palpitations, dyspnea with exertion, orthopnea, PND, peripheral edema, visual symptoms, neurologic problems, syncope, and side effects from treatment.        Positive major cardiovascular risk factors include male age 73 years old or older, hyperlipidemia, and hypertension.  Negative major cardiovascular risk factors include no history of diabetes, negative family history for ischemic heart disease, and non-tobacco-user status.        Further assessment for target organ damage reveals no history of ASHD, stroke/TIA, or peripheral vascular disease.    Lipid Management History:      Positive NCEP/ATP III risk factors include male age 34 years old or  older and hypertension.  Negative NCEP/ATP III risk factors include non-diabetic, no family history for ischemic heart disease, non-tobacco-user status, no ASHD (atherosclerotic heart disease), no prior stroke/TIA, no peripheral vascular disease, and no history of aortic aneurysm.        The patient states that he does not know about the "Therapeutic Lifestyle Change" diet.  His compliance with the TLC diet is fair.  The patient expresses understanding of adjunctive measures for cholesterol lowering.  Comments: He tries to watch diet. He walks for exersizeand does 15 minutes two times a week. Agreeable to increase. .    Current Problems (verified): 1)  Erectile Dysfunction  (ICD-302.72) 2)  Encounter For Long-term Use of Other Medications  (ICD-V58.69) 3)  Cerumen Impaction, Bilateral  (ICD-380.4) 4)  Sleep Apnea, Mild - Psg March 2009  (ICD-780.57) 5)  Gout  (ICD-274.9) 6)  Varicose Vein - Right Shin  (ICD-456.8) 7)  Hiatal Hernia  (ICD-553.3) 8)  Obesity Nos  (ICD-278.00) 9)  Hypertension  (ICD-401.9) 10)  Hyperlipidemia  (ICD-272.4) 11)  Gerd  (ICD-530.81)  Current Medications (verified): 1)  Lovastatin 20 Mg Tabs (Lovastatin) .... Once Daily 2)  Zestoretic 20-12.5 Mg  Tabs (Lisinopril-Hydrochlorothiazide) .... One Daily 3)  Indomethacin 25 Mg  Caps (Indomethacin) .... Two Times A Day For  Acute Gout Only  Allergies (verified): 1)  ! 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) HIATAL HERNIA (ICD-553.3) OBESITY NOS (ICD-278.00) HYPERTENSION (ICD-401.9) HYPERLIPIDEMIA (ICD-272.4) GERD (ICD-530.81)  Past Surgical History: Last updated: 03/16/2007 Denies surgical history  Family History: Last updated: 05/07/09 Father:Deceased; CVA 73 Mother: Deceased; DM 28 2 sisters:  HTN; DM  age 72 and age 68 3 brothers: OA, Gout, B12 def, ETOH abuse age 30, 59  end stae renal diseaise on dialysis and 77  1 child: healthy Girl - 66  Social History: Last updated: 05-07-2009 Married Never Smoked Alcohol use-no Drug use-no Occupation: Merchandiser, 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  Review of Systems      See HPI General:  Denies chills, fever, and sweats. Resp:  Denies cough, shortness of breath, sputum productive, and wheezing. GI:  Denies abdominal pain, constipation, diarrhea, nausea, and vomiting. GU:  Denies nocturia, urinary frequency, and urinary hesitancy. Psych:  Denies anxiety and depression.  Physical Exam  General:  Well-developed,well-nourished,in no acute distress; alert,appropriate and cooperative throughout examination.Obese. Ears:  Bilateral cerumen impaction. Unable to get out with curette. Lavage done with good result.Hearng improved and TMs normal Lungs:  Normal respiratory effort, chest expands symmetrically. Lungs are clear to auscultation, no crackles or wheezes. Heart:  Normal rate and regular rhythm. S1 and S2 normal without gallop, murmur, click, rub or other extra sounds. Abdomen:  Bowel sounds positive,abdomen soft and non-tender without masses, organomegaly or hernias noted. Extremities:  No clubbing, cyanosis, edema, or deformity noted with normal full range of motion of all joints.   Psych:  Cognition and judgment appear intact. Alert and cooperative with normal attention span and  concentration. No apparent delusions, illusions, hallucinations   Impression & Recommendations:  Problem # 1:  CERUMEN IMPACTION, BILATERAL (ICD-380.4) Lavage done. Councelled avoidance of qtips and educated why wax is important. Update if concerns.  Problem # 2:  GOUT (ICD-274.9) Rx as below. May need to DC HCTZ but with single to two flares a year not sure he wants this as BP well controlled. Check renal fucntion, uric acid level and CBC on high risk Indomethacin. Consider trial allopurinol. His updated medication list for this problem includes:    Indomethacin 25 Mg Caps (Indomethacin) .Marland Kitchen..Marland Kitchen Two times a day for acute gout only  Orders: T-Uric Acid (Blood) ([id number]) Venipuncture ([id number])  Problem # 3:  HYPERTENSION (ICD-401.9) Stable. See above. Limit salt, lose weight.Advised eye and dental care. His updated medication list for this problem includes:    Zestoretic 20-12.5 Mg Tabs (Lisinopril-hydrochlorothiazide) ..... One daily  Orders: T-Comprehensive Metabolic Panel [phone]) Venipuncture ([id number])  Problem # 4:  HYPERLIPIDEMIA (ICD-272.4) Statin as below. Check LFTs and limit fatty foods. See below. His updated medication list for this problem includes:    Lovastatin 20 Mg Tabs (Lovastatin) ..... Once daily  Orders: T-Comprehensive Metabolic Panel [phone]) Venipuncture ([id number])  Problem # 5:  OBESITY NOS (ICD-278.00) Discussed. Poor exersize regimen. Urged to get walking 30 minutes 5 days a week. Councelled portion control and educated plate method. Recheck 3 months. Aware of health risk.  Problem # 6:  Preventive Health Care (ICD-V70.0) Well male exam in August with repeat lipids, PSA.  Complete Medication List: 1)  Lovastatin 20 Mg Tabs (Lovastatin) .... Once daily 2)  Zestoretic 20-12.5 Mg Tabs (Lisinopril-hydrochlorothiazide) .... One daily 3)  Indomethacin 25 Mg Caps (Indomethacin) .... Two times a day for acute gout only  Other Orders: T-CBC w/Diff  ([id number])  Hypertension Assessment/Plan:      The patient's hypertensive risk group is category B: At least one risk factor (excluding diabetes) with no target organ damage.  His calculated 10 year risk of coronary heart disease is 4 %.  Today's blood pressure is 131/88.  His blood pressure goal is < 140/90.  Lipid Assessment/Plan:      Based on NCEP/ATP III, the patient's risk factor category is "2 or more risk factors and a calculated 10 year CAD risk of < 20%".  The patient's lipid goals are as follows: Total cholesterol goal is 200; LDL cholesterol goal is 130; HDL cholesterol goal is 40; Triglyceride goal is 150.    Patient Instructions: 1)  Please schedule a follow-up appointment in 3 months.

## 2010-12-07 NOTE — Assessment & Plan Note (Signed)
Summary: reschedule from no show...cjl   Vital Signs:  Patient Profile:   50 Years Old Male Height:     68 inches (172.72 cm) Weight:      283 pounds BMI:     43.19 O2 Sat:      99 % Temp:     97.6 degrees F Pulse rate:   94 / minute Resp:     14 per minute BP sitting:   136 / 88  Vitals Entered By: Sherilyn Banker (Mar 16, 2007 3:51 PM)               PCP:  Franchot Heidelberg, MD  Chief Complaint:  follow up visit.  History of Present Illness: Pt in for recheck.  He is doing well. He is worried about his weight. He has been walking. He does this daily. He tries to limit intake but loves soda - regular. He drinks 4 to 5 a day. He would  not be opposed to see a dietician. He states he does not exersize. He now presents.  Hypertension History:      He denies headache, chest pain, palpitations, dyspnea with exertion, orthopnea, PND, peripheral edema, visual symptoms, neurologic problems, syncope, and side effects from treatment.  He notes no problems with any antihypertensive medication side effects.  Further comments include: He takes meds daily. He is not exersizing. He does not limit salt intake. He has not had a yearly eye exam.        Positive major cardiovascular risk factors include hyperlipidemia and hypertension.  Negative major cardiovascular risk factors include male age less than 66 years old, no history of diabetes, negative family history for ischemic heart disease, and non-tobacco-user status.        Further assessment for target organ damage reveals no history of ASHD, stroke/TIA, or peripheral vascular disease.    Lipid Management History:      Positive NCEP/ATP III risk factors include hypertension.  Negative NCEP/ATP III risk factors include male age less than 21 years old, non-diabetic, no family history for ischemic heart disease, non-tobacco-user status, no ASHD (atherosclerotic heart disease), no prior stroke/TIA, no peripheral vascular disease, and no history of  aortic aneurysm.        The patient states that he knows about the "Therapeutic Lifestyle Change" diet.  His compliance with the TLC diet is poor.  The patient expresses understanding of adjunctive measures for cholesterol lowering.  Adjunctive measures started by the patient include aerobic exercise, fiber, and omega-3 supplements.  He expresses no side effects from his lipid-lowering medication.  The patient denies any symptoms to suggest myopathy or liver disease from his "statin" therapy.  Comments: Lipids stable - see paper chart.      Prior Medications :  LOVASTATIN 20 MG TABS (LOVASTATIN) once daily LISINOPRIL 2.5 MG TABS (LISINOPRIL) once daily METOCLOPRAMIDE HCL 10 MG TABS (METOCLOPRAMIDE HCL) at bedtime LEVOXYL 100 MCG TABS (LEVOTHYROXINE SODIUM) One daily    Current Allergies (reviewed today): No known allergies   Past Medical History:    Reviewed history from 10/12/2006 and no changes required:       GERD       Hyperlipidemia       Hypertension  Past Surgical History:    Denies surgical history   Family History:    Reviewed history and no changes required:       Ffather:Deceased; CVA 58       Mother: Deceased; DM 84  2 sisters: HTN; DM        3 brothers: OA       1 child: healthy Girl  Social History:    Reviewed history and no changes required:       Married       Never Smoked       Alcohol use-no       Drug use-no       Occupation: Merchandiser, retail at [employer]   Risk Factors:  Tobacco use:  never Drug use:  no Alcohol use:  no  Family History Risk Factors:    Family History of MI in females < 91 years old:  no    Family History of MI in males < 31 years old:  no   Review of Systems  General      Denies fatigue and sweats.  CV      Denies chest pain or discomfort, swelling of feet, swelling of hands, and weight gain.  Resp      Denies chest pain with inspiration, shortness of breath, sputum productive, and wheezing.  GI      Denies  abdominal pain, constipation, diarrhea, nausea, and vomiting.  GU      Denies decreased libido, nocturia, urinary frequency, and urinary hesitancy.   Physical Exam  General:     Well-developed,well-nourished,in no acute distress; alert,appropriate and cooperative throughout examination. Obese. Lungs:     Normal respiratory effort, chest expands symmetrically. Lungs are clear to auscultation, no crackles or wheezes. Heart:     Normal rate and regular rhythm. S1 and S2 normal without gallop, murmur, click, rub or other extra sounds. Abdomen:     Bowel sounds positive,abdomen soft and non-tender without masses, organomegaly or hernias noted. Extremities:     No clubbing, cyanosis, edema, or deformity noted with normal full range of motion of all joints.   Cervical Nodes:     No lymphadenopathy noted Psych:     Cognition and judgment appear intact. Alert and cooperative with normal attention span and concentration. No apparent delusions, illusions, hallucinations    Impression & Recommendations:  Problem # 1:  OBESITY NOS (ICD-278.00) Discussed. Advised TLC including Dcing regaulr soda. Switch to diet and limit to less than 2 a day. Start exersize by walking 3 to 4 times weekly - 30 minutes at a time. Gave pedometer and encouraged 10000 steps daily. Refer dietary for education and meal plan. Recheck 4 weeks and optomize. Consider gastric bypass etc.  Problem # 2:  HYPOTHYROIDISM (ICD-244.9) Repeat labs to assure not secondary cause for weight gain. Med as is and optomize once result reviewed. His updated medication list for this problem includes:    Levoxyl 100 Mcg Tabs (Levothyroxine sodium) ..... One daily  Orders: T-TSH ([id number])   Problem # 3:  HYPERLIPIDEMIA (ICD-272.4) Repeat CMP to assure renal and hepatic stability. His updated medication list for this problem includes:    Lovastatin 20 Mg Tabs (Lovastatin) ..... Once daily  Orders: T-Comprehensive Metabolic Panel  ([id number]) T-PSA Total [phone])   Problem # 4:  HYPERTENSION (ICD-401.9) Diet, exersize, weight loss and low salt intake a must. Councelled need for yearly eye exam. Review paperchart to assure EKG and UA has been done. If not optomize next visit. His updated medication list for this problem includes:    Lisinopril 2.5 Mg Tabs (Lisinopril) ..... Once daily  Orders: T-Comprehensive Metabolic Panel ([id number])   Problem # 5:  Preventive Health Care (ICD-V70.0) PSA lab - rectal next visit.  Medications  Added to Medication List This Visit: 1)  Prilosec 20 Mg Cpdr (Omeprazole) .... One daily  Hypertension Assessment/Plan:      The patient's hypertensive risk group is category B: At least one risk factor (excluding diabetes) with no target organ damage.  Today's blood pressure is 136/88.  His blood pressure goal is < 140/90.  Lipid Assessment/Plan:      Based on NCEP/ATP III, the patient's risk factor category is "0-1 risk factors".  From this information, the patient's calculated lipid goals are as follows: Total cholesterol goal is 200; LDL cholesterol goal is 160; HDL cholesterol goal is 40; Triglyceride goal is 150.    PSA Screening:    PSA: 1.22  (04/24/2006)   Patient Instructions: 1)  Please schedule a follow-up appointment in 1 month.   Appended Document: reschedule from no show...cjl Referral order faxed to dietary  Appended Document: reschedule from no show...cjl Patient notified and voices understanding

## 2010-12-07 NOTE — Progress Notes (Signed)
Summary: 06/28/2007 lab results  Phone Note Outgoing Call   Call placed by: Sonny Dandy,  June 29, 2007 11:42 AM Summary of Call: Results given to patient, voices understanding  Initial call taken by: Sonny Dandy,  June 29, 2007 11:42 AM

## 2010-12-07 NOTE — Letter (Signed)
Summary: LISINOPRIL REFILL  LISINOPRIL REFILL   Imported By: Magdalene River 01/15/2007 10:19:34  _____________________________________________________________________  External Attachment:    Type:   Image     Comment:   External Document

## 2010-12-07 NOTE — Assessment & Plan Note (Signed)
Summary: 3 month follow up/arc   Vital Signs:  Patient Profile:   50 Years Old Male Height:     68 inches (172.72 cm) Weight:      281 pounds BMI:     42.88 O2 Sat:      97 % Temp:     97.8 degrees F Pulse rate:   90 / minute Resp:     16 per minute BP sitting:   137 / 83  Vitals Entered By: Sherilyn Banker (September 21, 2007 4:02 PM)                 PCP:  Franchot Heidelberg, MD  Chief Complaint:  follow up visit.  History of Present Illness: Pt in for recheck.  He had labs after last visit and this is reviewed:  1. CMP - normal 2. Lipids -  see eblow.  He states he had an ED visit a few weeks ago. He had a gout flare. He has never had this before. Saw Tammy Tripplett and was started on Indomethacin. Helped a lot. States he had pain in right big toe. Pain was sharp andf he could hardly walk. He denies red wine use and he loves red meat. His brother has same and was addicted to ETOH. He denies GI upset with the medication. Toe  now good. He has not used medciation since Sep 12, 2007.  He has not had a flu-shot yet.  Requesting this.  He has a creek in his nck. Slept and now neck hurts STates sharp. Worse with head turning. Rates as 8/10. States sx for 24 hoiurs. Woke up.  He now presents.   Hypertension History:      He denies headache, chest pain, palpitations, dyspnea with exertion, orthopnea, PND, peripheral edema, visual symptoms, neurologic problems, syncope, and side effects from treatment.  He notes no problems with any antihypertensive medication side effects.  Further comments include: Taking meds. Watching salt. He is not really exersizing.        Positive major cardiovascular risk factors include male age 49 years old or older, hyperlipidemia, and hypertension.  Negative major cardiovascular risk factors include no history of diabetes, negative family history for ischemic heart disease, and non-tobacco-user status.        Further assessment for target organ damage  reveals no history of ASHD, stroke/TIA, or peripheral vascular disease.    Lipid Management History:      Positive NCEP/ATP III risk factors include male age 49 years old or older and hypertension.  Negative NCEP/ATP III risk factors include non-diabetic, no family history for ischemic heart disease, non-tobacco-user status, no ASHD (atherosclerotic heart disease), no prior stroke/TIA, no peripheral vascular disease, and no history of aortic aneurysm.        The patient states that he knows about the "Therapeutic Lifestyle Change" diet.  His compliance with the TLC diet is fair.  The patient expresses understanding of adjunctive measures for cholesterol lowering.  Adjunctive measures started by the patient include aerobic exercise, fiber, and omega-3 supplements.  He expresses no side effects from his lipid-lowering medication.  The patient denies any symptoms to suggest myopathy or liver disease from his "statin" therapy.      Current Allergies: No known allergies       Physical Exam  General:     Well-developed,well-nourished,in no acute distress; alert,appropriate and cooperative throughout examination Neck:     No deformities, masses.Tender left lower side and sx exacerbated with neck ROM. Lungs:  Normal respiratory effort, chest expands symmetrically. Lungs are clear to auscultation, no crackles or wheezes. Heart:     Normal rate and regular rhythm. S1 and S2 normal without gallop, murmur, click, rub or other extra sounds. Abdomen:     Bowel sounds positive,abdomen soft and non-tender without masses, organomegaly or hernias noted. Extremities:     No clubbing, cyanosis, edema, or deformity noted with normal full range of motion of all joints.   Psych:     Cognition and judgment appear intact. Alert and cooperative with normal attention span and concentration. No apparent delusions, illusions, hallucinations    Impression & Recommendations:  Problem # 1:  GOUT (ICD-274.9)  Assessment: New New dx. Advised uric acid level. Councelled avoidance of precipitating foods including excess red meat, red wine etc. Handout given. Will await lab, track bouts and optomize. if any recurrence, update immediately. Agrees. Orders: T-Uric Acid ([id number]) Venipuncture ([id number])   Problem # 2:  NECK PAIN, LEFT (ICD-723.1) Suspect muscle strain with hx of waking up with it and clinical findings. Start muscle relaxer, heating pad and neck ROM eexersize. Update if worse or persist. Agrees. His updated medication list for this problem includes:    Flexeril 10 Mg Tabs (Cyclobenzaprine hcl) ..... One three times a day as needed for neck pain   Problem # 3:  HYPERLIPIDEMIA (ICD-272.4) Exellent control. Statin as is with TLC encouraged. Advised weight loss, portion control and exersize. His updated medication list for this problem includes:    Lovastatin 20 Mg Tabs (Lovastatin) ..... Once daily   Problem # 4:  HYPERTENSION (ICD-401.9) Stable. Meds as is. Advgised eye care, dental care and TLC as above. His updated medication list for this problem includes:    Zestoretic 20-12.5 Mg Tabs (Lisinopril-hydrochlorothiazide) ..... One daily   Problem # 5:  Preventive Health Care (ICD-V70.0) Flu-shot advised. Aware of risk and benefit. Agreeable. Follow-up if any concern.  Complete Medication List: 1)  Lovastatin 20 Mg Tabs (Lovastatin) .... Once daily 2)  Zestoretic 20-12.5 Mg Tabs (Lisinopril-hydrochlorothiazide) .... One daily 3)  Lamisil At Crea (Terbinafine hcl crea) .... Apply to feet twice daily for 4 weeks 4)  Flexeril 10 Mg Tabs (Cyclobenzaprine hcl) .... One three times a day as needed for neck pain  Hypertension Assessment/Plan:      The patient's hypertensive risk group is category B: At least one risk factor (excluding diabetes) with no target organ damage.  His calculated 10 year risk of coronary heart disease is 4 %.  Today's blood pressure is 137/83.  His blood  pressure goal is < 140/90.  Lipid Assessment/Plan:      Based on NCEP/ATP III, the patient's risk factor category is "2 or more risk factors and a calculated 10 year CAD risk of < 20%".  From this information, the patient's calculated lipid goals are as follows: Total cholesterol goal is 200; LDL cholesterol goal is 130; HDL cholesterol goal is 40; Triglyceride goal is 150.     Patient Instructions: 1)  Please schedule a follow-up appointment in 3 months.    Prescriptions: FLEXERIL 10 MG  TABS (CYCLOBENZAPRINE HCL) One three times a day as needed for neck pain  #15 x 0   Entered and Authorized by:   Franchot Heidelberg MD   Signed by:   Franchot Heidelberg MD on 09/21/2007   Method used:   Print then Give to Patient   RxID:   [id number]  ]  Preventive Care Screening  Last Flu Shot:  Date:  09/21/2007    Next Due:  09/2008    Results:  given  Appended Document: Orders Update    Clinical Lists Changes  Orders: Added new Service order of Influenza Vaccine NON MCR ([id number]) - Signed Observations: Added new observation of FLU VAX#1VIS: 05/06/05 version given September 21, 2007. (09/21/2007 16:23) Added new observation of FLU VAX EXP: 03/07/2008 (09/21/2007 16:23) Added new observation of FLU VAXBY: Sherilyn Banker (09/21/2007 16:23) Added new observation of FLU VAXRTE: IM (09/21/2007 16:23) Added new observation of FLU VAX DSE: 0.5 ml (09/21/2007 16:23) Added new observation of FLU VAX SITE: right deltoid (09/21/2007 16:23) Added new observation of FLU VAX: Fluvax Non-MCR (09/21/2007 16:23)        Influenza Vaccine    Vaccine Type: Fluvax Non-MCR    Site: right deltoid    Dose: 0.5 ml    Route: IM    Given by: Sherilyn Banker    Exp. Date: 03/07/2008    VIS given: 05/06/05 version given September 21, 2007.  Flu Vaccine Consent Questions    Do you have a history of severe allergic reactions to this vaccine? no    Any prior history of allergic reactions to egg and/or  gelatin? no    Do you have a sensitivity to the preservative Thimersol? no    Do you have a past history of Guillan-Barre Syndrome? no    Do you currently have an acute febrile illness? no    Have you ever had a severe reaction to latex? no    Vaccine information given and explained to patient? yes

## 2010-12-07 NOTE — Assessment & Plan Note (Signed)
Summary: 3 month follow up/arc   Vital Signs:  Patient Profile:   50 Years Old Male Height:     68 inches (172.72 cm) Weight:      285 pounds Pulse rate:   71 / minute Resp:     16 per minute BP sitting:   123 / 78  (left arm)  Pt. in pain?   no              Comments OK since last visit, c/o being tired in the mornings     PCP:  Franchot Heidelberg, MD  Chief Complaint:  3 month follow up.  History of Present Illness: Pt in for recheck.  He notes he is doing well. He notes just feeling tired. He thinks his weight is attributing. He sleeps good. He hs to force self to get up and going in the am. Not as tired as when he goes to bed at night. Not a snores. Does gasp at night. States has had few times. He wears a 3x shirt. Notes feels he can just go back to sleep during daytime. He denies family hx of sleep apnea and he has never been tested. He denies ETOH/drug use. He denies smoking and has no hx of COPD.He goes to bed at 21h30. gets up at 04h00. He sleeps 3 to 4 hours and then just wakes up. Not sure why. Occasioanlly will urinate but mostly just wakes up. States gasping sometimes does this. Agreeable with Apnea Link - not to eager to have sleep study.  Has gout. Curious if he can have something in case this flares. Has had one spell sine last visit. Notes limiting red meats and avoiud ETOH.  He now presents.  Hypertension History:      He denies headache, chest pain, palpitations, dyspnea with exertion, orthopnea, PND, peripheral edema, visual symptoms, neurologic problems, syncope, and side effects from treatment.  He notes no problems with any antihypertensive medication side effects.  Further comments include: Wathcing salt. Has seen  eye MD. Has seen dentist.        Positive major cardiovascular risk factors include male age 4 years old or older, hyperlipidemia, and hypertension.  Negative major cardiovascular risk factors include no history of diabetes, negative family  history for ischemic heart disease, and non-tobacco-user status.        Further assessment for target organ damage reveals no history of ASHD, stroke/TIA, or peripheral vascular disease.    Lipid Management History:      Positive NCEP/ATP III risk factors include male age 6 years old or older and hypertension.  Negative NCEP/ATP III risk factors include non-diabetic, no family history for ischemic heart disease, non-tobacco-user status, no ASHD (atherosclerotic heart disease), no prior stroke/TIA, no peripheral vascular disease, and no history of aortic aneurysm.        The patient states that he knows about the "Therapeutic Lifestyle Change" diet.  His compliance with the TLC diet is fair.  The patient expresses understanding of adjunctive measures for cholesterol lowering.  Adjunctive measures started by the patient include aerobic exercise, fiber, ASA, and omega-3 supplements.  He expresses no side effects from his lipid-lowering medication.  The patient denies any symptoms to suggest myopathy or liver disease from his "statin" therapy.  Comments: imited exersize. Notes needs to eat more fresh food. Marland Kitchen      Updated Prior Medication List: LOVASTATIN 20 MG TABS (LOVASTATIN) once daily ZESTORETIC 20-12.5 MG  TABS (LISINOPRIL-HYDROCHLOROTHIAZIDE) One daily INDOMETHACIN 25 MG  CAPS (INDOMETHACIN) two times a day for acute gout only  Current Allergies (reviewed today): No known allergies   Past Medical History:    Reviewed history from 10/12/2006 and no changes required:       GERD       Hyperlipidemia       Hypertension  Past Surgical History:    Reviewed history from 03/16/2007 and no changes required:       Denies surgical history   Family History:    Reviewed history from 03/16/2007 and no changes required:       Ffather:Deceased; CVA 48       Mother: Deceased; DM 13       2 sisters: HTN; DM        3 brothers: OA       1 child: healthy Girl  Social History:    Reviewed history  from 03/16/2007 and no changes required:       Married       Never Smoked       Alcohol use-no       Drug use-no       Occupation: Merchandiser, retail at [employer]   Risk Factors: Tobacco use:  never Drug use:  no Alcohol use:  no  Family History Risk Factors:    Family History of MI in females < 39 years old:  no    Family History of MI in males < 18 years old:  no   Review of Systems      See HPI  General      Denies fatigue and malaise.  Resp      Denies cough, shortness of breath, sputum productive, and wheezing.  GI      Denies abdominal pain, constipation, diarrhea, nausea, and vomiting.  GU      Denies nocturia, urinary frequency, and urinary hesitancy.   Physical Exam  General:     Well-developed,well-nourished,in no acute distress; alert,appropriate and cooperative throughout examination.Obese. Lungs:     Normal respiratory effort, chest expands symmetrically. Lungs are clear to auscultation, no crackles or wheezes. Heart:     Normal rate and regular rhythm. S1 and S2 normal without gallop, murmur, click, rub or other extra sounds. Abdomen:     Bowel sounds positive,abdomen soft and non-tender without masses, organomegaly or hernias noted. Extremities:     No clubbing, cyanosis, edema, or deformity noted with normal full range of motion of all joints.   Psych:     Cognition and judgment appear intact. Alert and cooperative with normal attention span and concentration. No apparent delusions, illusions, hallucinations    Impression & Recommendations:  Problem # 1:  MALAISE AND FATIGUE (ICD-780.79) Discussed. Suspect sx relatd to sleep apnea. Check CBC and TSH and refer for apnea link to eval for sleep apnea. Advised diet, exersize and weight loss. Orders: T-CBC w/Diff ([id number]) T-TSH [phone])   Problem # 2:  GOUT (ICD-274.9) Trial Indomethacin for acute flare only. Councelled risk and benefit inlcuding GI upset and bleed. Must take with food and  advised OTC omperazole when h does take this. We will track flares if if he has these frequently will start Allopurinol. His updated medication list for this problem includes:    Indomethacin 25 Mg Caps (Indomethacin) .Marland Kitchen..Marland Kitchen Two times a day for acute gout only   Problem # 3:  RISK OF SLEEP APNEA (ICD-V12.59) Sx suggestive sleep apnea. Refer [facility] for overnight oxymetry. Agrees. May need formal sleep study based on this.  Problem #  4:  OBESITY NOS (ICD-278.00) TLC a must. Councelled portion control, healthy diet.   Problem # 5:  HYPERLIPIDEMIA (ICD-272.4) Statin s is. Check LFTs. His updated medication list for this problem includes:    Lovastatin 20 Mg Tabs (Lovastatin) ..... Once daily  Orders: T-Comprehensive Metabolic Panel [id number] [id number]) T-Lipid Profile [phone])   Complete Medication List: 1)  Lovastatin 20 Mg Tabs (Lovastatin) .... Once daily 2)  Zestoretic 20-12.5 Mg Tabs (Lisinopril-hydrochlorothiazide) .... One daily 3)  Indomethacin 25 Mg Caps (Indomethacin) .... Two times a day for acute gout only 4)  Apnea Link  .... Sx of sleep apnea please screen  Hypertension Assessment/Plan:      The patient's hypertensive risk group is category B: At least one risk factor (excluding diabetes) with no target organ damage.  His calculated 10 year risk of coronary heart disease is 4 %.  Today's blood pressure is 123/78.  His blood pressure goal is < 140/90.  Lipid Assessment/Plan:      Based on NCEP/ATP III, the patient's risk factor category is "2 or more risk factors and a calculated 10 year CAD risk of < 20%".  From this information, the patient's calculated lipid goals are as follows: Total cholesterol goal is 200; LDL cholesterol goal is 130; HDL cholesterol goal is 40; Triglyceride goal is 150.     Patient Instructions: 1)  Please schedule a follow-up appointment in 1 month.    Prescriptions: APNEA LINK Sx of sleep apnea Please screen  #1 x 0   Entered and Authorized by:    Franchot Heidelberg MD   Signed by:   Franchot Heidelberg MD on 12/21/2007   Method used:   Print then Give to Patient   RxID:   [id number] INDOMETHACIN 25 MG  CAPS (INDOMETHACIN) two times a day for acute gout only  #60 x 0   Entered and Authorized by:   Franchot Heidelberg MD   Signed by:   Franchot Heidelberg MD on 12/21/2007   Method used:   Print then Give to Patient   RxID:   [id number]  ]

## 2010-12-07 NOTE — Letter (Signed)
Summary: External Other  External Other   Imported By: Curtis Sites 01/08/2008 13:31:50  _____________________________________________________________________  External Attachment:    Type:   Image     Comment:   External Document

## 2010-12-07 NOTE — Assessment & Plan Note (Signed)
Summary: four week f/u...cjl   Vital Signs:  Patient Profile:   50 Years Old Male Height:     68 inches (172.72 cm) Weight:      276 pounds Pulse rate:   82 / minute Resp:     16 per minute BP sitting:   124 / 76  (left arm)  Pt. in pain?   no  Vitals Entered BySonny Dandy (April 20, 2007 4:26 PM)                PCP:  Franchot Heidelberg, MD  Chief Complaint:  4 week follow up.  History of Present Illness: Pt comes in for recheck.  There seems to be confusion over his meds. I updated EMR on hypothyroidism recently and added synthroid. He states he thought he had the disease but when asked about meds again today he states he is not on Synthroid. He seems confused about meds and hence his regular pharmacy is called. Note use of meds as under medications but not on synthroid.   He denies malaise and fatigue and has no dry skin or constipation.  He is worried about his feet. They tingle all the time. He has ithcing and whitye spots between the toes and is curious if I would look at this for him.  Labs done last visit are revieed today.  1. CMP: Random glucose 104 2. PSA - normal 3. TSH - normal  Needs rectal today. Denies significant urinary sx and goes to bathroom once a night occasionally. No personal or family hx of prostate cancer.  Now presents.  Hypertension History:      He denies headache, chest pain, palpitations, dyspnea with exertion, orthopnea, PND, peripheral edema, visual symptoms, neurologic problems, syncope, and side effects from treatment.  He notes no problems with any antihypertensive medication side effects.  Further comments include: Working on diet. Down several pounds since last visit by eliminating soda.        Positive major cardiovascular risk factors include hyperlipidemia and hypertension.  Negative major cardiovascular risk factors include male age less than 25 years old, no history of diabetes, negative family history for ischemic heart disease,  and non-tobacco-user status.        Further assessment for target organ damage reveals no history of ASHD, stroke/TIA, or peripheral vascular disease.    Lipid Management History:      Positive NCEP/ATP III risk factors include hypertension.  Negative NCEP/ATP III risk factors include male age less than 56 years old, non-diabetic, no family history for ischemic heart disease, non-tobacco-user status, no ASHD (atherosclerotic heart disease), no prior stroke/TIA, no peripheral vascular disease, and no history of aortic aneurysm.        The patient states that he knows about the "Therapeutic Lifestyle Change" diet.  His compliance with the TLC diet is poor.  The patient expresses understanding of adjunctive measures for cholesterol lowering.  Adjunctive measures started by the patient include aerobic exercise, fiber, and omega-3 supplements.  He expresses no side effects from his lipid-lowering medication.  The patient denies any symptoms to suggest myopathy or liver disease from his "statin" therapy.  Comments: Due for lipids late July.  TC 182, Trig 278, HDL 32 and LDL 83 last year.    Current Allergies (reviewed today): No known allergies   Past Medical History:    Reviewed history from 10/12/2006 and no changes required:       GERD       Hyperlipidemia  Hypertension  Past Surgical History:    Reviewed history from 03/16/2007 and no changes required:       Denies surgical history   Family History:    Reviewed history from 03/16/2007 and no changes required:       Ffather:Deceased; CVA 31       Mother: Deceased; DM 35       2 sisters: HTN; DM        3 brothers: OA       1 child: healthy Girl  Social History:    Reviewed history from 03/16/2007 and no changes required:       Married       Never Smoked       Alcohol use-no       Drug use-no       Occupation: Merchandiser, 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      Off Prilosec. Stopped it and no sx of GERD  GU      Goes to bathroom once a night.   Physical Exam  General:     Well-developed,well-nourished,in no acute distress; alert,appropriate and cooperative throughout examination. Obese. Lungs:     Normal respiratory effort, chest expands symmetrically. Lungs are clear to auscultation, no crackles or wheezes. Heart:     Normal rate and regular rhythm. S1 and S2 normal without gallop, murmur, click, rub or other extra sounds. Abdomen:     Bowel sounds positive,abdomen soft and non-tender without masses, organomegaly or hernias noted. Rectal:     No external abnormalities noted. Normal sphincter tone. No rectal masses or tenderness. Prostate:     Prostate gland firm and smooth, no enlargement, nodularity, tenderness, mass, asymmetry or induration. Extremities:     No clubbing, cyanosis, edema, or deformity noted with normal full range of motion of all joints.   Skin:     Tinea pedis    Impression & Recommendations:  Problem # 1:  TINEA PEDIS (ICD-110.4) Discussed. Start Lamisil AT and recheck if not better. Wash socks daily, use selsun blue as body wash for few weeks. Treat shoes with goldbond powder. His updated medication list for this problem includes:    Lamisil At Crea (Terbinafine hcl crea) .Marland Kitchen... Apply to feet twice daily for 4 weeks   Problem # 2:  HYPERTENSION (ICD-401.9) Stable. Meds as is with diet, exersize and weight loss a must. Also advised yearly eye exam. His updated medication list for this problem includes:    Zestoretic 20-12.5 Mg Tabs (Lisinopril-hydrochlorothiazide) ..... One daily   Problem # 3:  HYPERLIPIDEMIA (ICD-272.4) Repeat lipids 6 to 8 weeks. LFTS stable. His updated medication list for this problem includes:    Lovastatin 20 Mg Tabs (Lovastatin) ..... Once daily   Problem # 4:  OBESITY NOS (ICD-278.00) See above. Cont conservative  measures. Councelled diet, exersize and congratulated on weight loss since last visit.  Problem # 5:  SCREENING FOR MALIGNANT NEOPLASM, PROSTATE (ICD-V76.44) Complete. Recheck one year.  Problem # 6:  Hypothyroidism Discusssed. Believe transitional error with EMR. Remove diagnosis and med.  Medications Added to Medication List This Visit: 1)  Zestoretic 20-12.5 Mg Tabs (Lisinopril-hydrochlorothiazide) .... One daily 2)  Lamisil At Crea (Terbinafine hcl crea) .... Apply to feet twice daily for 4 weeks  Hypertension Assessment/Plan:      The patient's hypertensive risk group  is category B: At least one risk factor (excluding diabetes) with no target organ damage.  Today's blood pressure is 124/76.  His blood pressure goal is < 140/90.  Lipid Assessment/Plan:      Based on NCEP/ATP III, the patient's risk factor category is "0-1 risk factors".  From this information, the patient's calculated lipid goals are as follows: Total cholesterol goal is 200; LDL cholesterol goal is 160; HDL cholesterol goal is 40; Triglyceride goal is 150.     Patient Instructions: 1)  Please schedule a follow-up appointment in 2 months - sooner if needed.

## 2011-03-22 NOTE — Procedures (Signed)
NAME:  Roberto Guerra, Roberto Guerra NO.:  [id number]   MEDICAL RECORD NO.:  [id number]          PATIENT TYPE:  OUT   LOCATION:  SLEEP LAB                     FACILITY:  [facility]   PHYSICIAN:  Barbaraann Share, MD,FCCPDATE OF BIRTH:  December 02[birth year] 1961   DATE OF STUDY:  01/26/2008                            NOCTURNAL POLYSOMNOGRAM   REFERRING PHYSICIAN:  Franchot Heidelberg, M.D.   LOCATION:  Sleep lab.   REFERRING PHYSICIAN:  Franchot Heidelberg, M.D.   INDICATION FOR STUDY:  Hypersomnia with sleep apnea.   EPWORTH SLEEPINESS SCORE:  7   SLEEP ARCHITECTURE:  Patient had a total sleep time of 282 minutes, with  very little slow-wave sleep and REM.  Sleep onset latency was normal,  and REM onset was slightly prolonged at 103 minutes.  Sleep efficiency  was very decreased at 61%.   RESPIRATORY DATA:  The patient was found to have one apnea and 51  obstructive hypopneas for an apnea-hypopnea index of 11 events per hour.  The events occurred primarily in the supine position, and there was very  loud snoring noted throughout.  The patient did not meet split-night  criteria, secondary to the small numbers of event in the first half of  the night.   OXYGEN DATA:  There was O2 desaturation and slow as 86% with the  patient's obstructive events.   CARDIAC DATA:  Rare PVCs noted but no clinically significant arrhythmia.   MOVEMENT-PARASOMNIA:  The patient was found to have 497 leg jerks, with  one per hour resulting in arousal or awakening.   IMPRESSIONS-RECOMMENDATIONS:  1. Mild obstructive sleep apnea/hypopnea syndrome, with an      apnea/hypopnea index of 11 events per hour and O2 desaturation as      low as 86%.  The patient did not meet split-night criteria,      secondary to the small numbers of events in the first half of the      night.  Treatment for this degree of sleep apnea can include weight      loss alone, if applicable, upper airway surgery, oral appliance,      and also  CPAP.  Clinical correlation is suggested.  2. Rare PVC, but no clinically significant arrhythmia.  3. Very large numbers of leg jerks with very little sleep disruption.      However, if the patient does not have      significant improvement in his symptoms with treatment of his sleep      apnea, then I would consider whether or not he has a primary      movement disorder of sleep.      Barbaraann Share, MD,FCCP  Diplomate, American Board of Sleep  Medicine  Electronically Signed     [facility]  D:  02/12/2008 16:35:12  T:  02/12/2008 21:07:53  Job:  [id number]

## 2011-08-21 ENCOUNTER — Encounter: Payer: Self-pay | Admitting: *Deleted

## 2011-08-21 ENCOUNTER — Emergency Department (HOSPITAL_COMMUNITY)
Admission: EM | Admit: 2011-08-21 | Discharge: 2011-08-21 | Disposition: A | Discharge status: Home / Self Care | Payer: BC Managed Care – PPO | Attending: Emergency Medicine | Admitting: Emergency Medicine

## 2011-08-21 DIAGNOSIS — M79609 Pain in unspecified limb: Secondary | ICD-10-CM | POA: Insufficient documentation

## 2011-08-21 DIAGNOSIS — M549 Dorsalgia, unspecified: Secondary | ICD-10-CM

## 2011-08-21 DIAGNOSIS — M5430 Sciatica, unspecified side: Secondary | ICD-10-CM

## 2011-08-21 DIAGNOSIS — E785 Hyperlipidemia, unspecified: Secondary | ICD-10-CM | POA: Insufficient documentation

## 2011-08-21 DIAGNOSIS — M545 Low back pain, unspecified: Secondary | ICD-10-CM | POA: Insufficient documentation

## 2011-08-21 DIAGNOSIS — I10 Essential (primary) hypertension: Secondary | ICD-10-CM | POA: Insufficient documentation

## 2011-08-21 HISTORY — DX: Hyperlipidemia, unspecified: E78.5

## 2011-08-21 HISTORY — DX: Essential (primary) hypertension: I10

## 2011-08-21 MED ORDER — HYDROCODONE-ACETAMINOPHEN 5-325 MG PO TABS
1.0000 | ORAL_TABLET | Freq: Once | ORAL | Status: AC
  Administered 2011-08-21: 1 via ORAL
  Filled 2011-08-21: qty 1

## 2011-08-21 MED ORDER — HYDROCODONE-ACETAMINOPHEN 5-325 MG PO TABS: 1.0000 | ORAL_TABLET | ORAL | Status: AC | PRN

## 2011-08-21 NOTE — ED Provider Notes (Signed)
History     CSN: [id number] Arrival date & time: 08/21/2011  2:21 AM  Chief Complaint  Patient presents with  . Back Pain    (Consider location/radiation/quality/duration/timing/severity/associated sxs/prior treatment) HPI Comments: Seen 0301.  Patient is a 50 y.o. male presenting with back pain. The history is provided by the patient.  Back Pain  This is a new (Patient  has a history of low back pain however, recently for several days he has had increased pain with pain radiatiing down both legs.) problem. The current episode started more than 2 days ago. The problem occurs constantly. The problem has been gradually worsening. The pain is associated with no known injury. The pain is present in the lumbar spine. The quality of the pain is described as stabbing and shooting. The pain radiates to the left thigh and right thigh. The pain is at a severity of 8/10. The pain is moderate. Exacerbated by: walking or standing. The pain is the same all the time. Associated symptoms include leg pain. Pertinent negatives include no dysuria, no paresthesias, no paresis, no tingling and no weakness. Treatments tried: relafen. The treatment provided no relief.    Past Medical History  Diagnosis Date  . Hypertension   . Hyperlipidemia   . Gout     History reviewed. No pertinent past surgical history.  No family history on file.  History  Substance Use Topics  . Smoking status: Never Smoker   . Smokeless tobacco: Not on file  . Alcohol Use: No      Review of Systems  Genitourinary: Negative for dysuria.  Musculoskeletal: Positive for back pain.       Leg pain bilaterally  Neurological: Negative for tingling, weakness and paresthesias.  All other systems reviewed and are negative.    Allergies  Review of patient's allergies indicates no known allergies.  Home Medications   Current Outpatient Rx  Name Route Sig Dispense Refill  . ALLOPURINOL 300 MG PO TABS Oral Take 300 mg by  mouth daily.      Marland Kitchen LISINOPRIL-HYDROCHLOROTHIAZIDE 20-12.5 MG PO TABS Oral Take 1 tablet by mouth daily.      Marland Kitchen LOVASTATIN 20 MG PO TABS Oral Take 20 mg by mouth at bedtime.      Marland Kitchen NABUMETONE 750 MG PO TABS Oral Take 750 mg by mouth 2 (two) times daily as needed.        BP 135/93  Pulse 90  Temp(Src) 98.4 F (36.9 C) (Oral)  Resp 20  Ht 5\' 7"  (1.702 m)  Wt 270 lb (122.471 kg)  BMI 42.29 kg/m2  SpO2 98%  Physical Exam  Nursing note and vitals reviewed. Constitutional: He is oriented to person, place, and time. He appears well-developed and well-nourished. He appears distressed.  HENT:  Head: Normocephalic and atraumatic.  Mouth/Throat: Oropharynx is clear and moist.  Eyes: EOM are normal.  Neck: Normal range of motion. Neck supple.  Cardiovascular: Normal rate, normal heart sounds and intact distal pulses.   Pulmonary/Chest: Effort normal and breath sounds normal.  Abdominal: Soft.  Musculoskeletal: Normal range of motion.       No spinal tenderness to percussion. Able to bend over toward the floor. With any lateral movement has shooting pains down his legs. Negative straight leg raise bilaterally.Pulses 2+  Neurological: He is alert and oriented to person, place, and time. He has normal reflexes.  Skin: Skin is warm and dry.    ED Course  Procedures (including critical care time)    MDM  Patient with low back pain chronically now with exacerbation associated with bilateral sciatic symptoms. No focal neurological findings. Analgesics resulted in improvement. Pt stable in ED with no significant deterioration in condition.Patient to follow up with his doctor on Monday. MDM Reviewed: nursing note and vitals           Nicoletta Dress. Colon Branch, MD 08/21/11 [phone]

## 2011-08-21 NOTE — ED Notes (Signed)
Pt reports lower back pain radiating down both legs for the past week, c/o increased pain tonight

## 2012-06-24 ENCOUNTER — Emergency Department (HOSPITAL_COMMUNITY)
Admission: EM | Admit: 2012-06-24 | Discharge: 2012-06-24 | Disposition: A | Discharge status: Home / Self Care | Payer: BC Managed Care – PPO | Attending: Emergency Medicine | Admitting: Emergency Medicine

## 2012-06-24 ENCOUNTER — Encounter (HOSPITAL_COMMUNITY): Payer: Self-pay | Admitting: *Deleted

## 2012-06-24 DIAGNOSIS — M549 Dorsalgia, unspecified: Secondary | ICD-10-CM

## 2012-06-24 DIAGNOSIS — M25559 Pain in unspecified hip: Secondary | ICD-10-CM | POA: Insufficient documentation

## 2012-06-24 DIAGNOSIS — M79609 Pain in unspecified limb: Secondary | ICD-10-CM | POA: Insufficient documentation

## 2012-06-24 DIAGNOSIS — M109 Gout, unspecified: Secondary | ICD-10-CM | POA: Insufficient documentation

## 2012-06-24 DIAGNOSIS — E785 Hyperlipidemia, unspecified: Secondary | ICD-10-CM | POA: Insufficient documentation

## 2012-06-24 DIAGNOSIS — I10 Essential (primary) hypertension: Secondary | ICD-10-CM | POA: Insufficient documentation

## 2012-06-24 MED ORDER — IBUPROFEN 800 MG PO TABS: 800.0000 mg | ORAL_TABLET | Freq: Three times a day (TID) | ORAL | Status: AC

## 2012-06-24 MED ORDER — HYDROCODONE-ACETAMINOPHEN 5-500 MG PO TABS: 1.0000 | ORAL_TABLET | Freq: Every evening | ORAL | Status: AC | PRN

## 2012-06-24 MED ORDER — CYCLOBENZAPRINE HCL 10 MG PO TABS: 10.0000 mg | ORAL_TABLET | Freq: Two times a day (BID) | ORAL | Status: AC | PRN

## 2012-06-24 NOTE — ED Notes (Addendum)
Pt c/o right lower side pain radiating down right leg x 1wk. Denies any uti symptoms. Pain is worse in his leg when he walks.

## 2012-06-24 NOTE — ED Provider Notes (Signed)
History     CSN: [id number]  Arrival date & time 06/24/12  0551   First MD Initiated Contact with Patient 06/24/12 [phone]      Chief Complaint  Patient presents with  . Leg Pain  . Hip Pain    (Consider location/radiation/quality/duration/timing/severity/associated sxs/prior treatment) HPI Hx per PT. R sided LBP for the last week, evaluated by PCP and prescribed steroids but is very reluctant to try them and has not filled prescription.  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  No h/o previous back pain or issues.  Pain sometimes radiates to RLE - not today. No weakness or numbness. Worse with standing and movement. No F/C, is not diabetic. No rash. No incont. No saddle paraesthesia.  Past Medical History  Diagnosis Date  . Hypertension   . Hyperlipidemia   . Gout     History reviewed. No pertinent past surgical history.  History reviewed. No pertinent family history.  History  Substance Use Topics  . Smoking status: Never Smoker   . Smokeless tobacco: Not on file  . Alcohol Use: No      Review of Systems  Constitutional: Negative for fever and chills.  HENT: Negative for neck pain and neck stiffness.   Eyes: Negative for pain.  Respiratory: Negative for shortness of breath.   Cardiovascular: Negative for chest pain.  Gastrointestinal: Negative for abdominal pain.  Genitourinary: Negative for dysuria.  Musculoskeletal: Positive for back pain.  Skin: Negative for rash.  Neurological: Negative for headaches.  All other systems reviewed and are negative.    Allergies  Review of patient's allergies indicates no known allergies.  Home Medications   Current Outpatient Rx  Name Route Sig Dispense Refill  . ALLOPURINOL 300 MG PO TABS Oral Take 300 mg by mouth daily.      Marland Kitchen LISINOPRIL-HYDROCHLOROTHIAZIDE 20-12.5 MG PO TABS Oral Take 1 tablet by mouth daily.      Marland Kitchen NAPROXEN 500 MG PO TABS Oral Take 500 mg by mouth 2 (two)  times daily with a meal.    . PRAVASTATIN SODIUM 40 MG PO TABS Oral Take 40 mg by mouth daily.    . CYCLOBENZAPRINE HCL 10 MG PO TABS Oral Take 1 tablet (10 mg total) by mouth 2 (two) times daily as needed for muscle spasms. 20 tablet 0  . HYDROCODONE-ACETAMINOPHEN 5-500 MG PO TABS Oral Take 1 tablet by mouth at bedtime as needed for pain (no driving while medicated). 10 tablet 0  . IBUPROFEN 800 MG PO TABS Oral Take 1 tablet (800 mg total) by mouth 3 (three) times daily. 21 tablet 0    BP 133/85  Pulse 62  Temp 98.6 F (37 C) (Oral)  Resp 20  Ht 5\' 9"  (1.753 m)  Wt 280 lb (127.007 kg)  BMI 41.35 kg/m2  SpO2 99%  Physical Exam  Constitutional: He is oriented to person, place, and time. He appears well-developed and well-nourished.  HENT:  Head: Normocephalic and atraumatic.  Eyes: Conjunctivae and EOM are normal. Pupils are equal, round, and reactive to light.  Neck: Full passive range of motion without pain. Neck supple. No thyromegaly present.       No cervical spine tenderness  Cardiovascular: Normal rate, regular rhythm, S1 normal, S2 normal and intact distal pulses.   Pulmonary/Chest: Effort normal and breath sounds normal.  Abdominal: Soft. Bowel sounds are normal. There is no tenderness. There is no CVA tenderness.  Musculoskeletal: Normal range of  motion.       Tender R lower paralumbar without midline tenderness or deformity. No LE deficits with equal strengths, sensorium to light touch and DTRs. Gait intact and normal  Neurological: He is alert and oriented to person, place, and time. He has normal strength and normal reflexes. No cranial nerve deficit or sensory deficit. He displays a negative Romberg sign. GCS eye subscore is 4. GCS verbal subscore is 5. GCS motor subscore is 6.       Normal Gait  Skin: Skin is warm and dry. No rash noted. No cyanosis. Nails show no clubbing.  Psychiatric: He has a normal mood and affect. His speech is normal and behavior is normal.     ED Course  Procedures (including critical care time)    1. Back pain    PT driving, requesting RX and no pain meds now - feels like is able to work with his symptoms. Rx provided with back pain precautions verbalized as understood. Written precautions provided. No red flags or indication for emergent imaging of L spine.  Reliable historian agrees to all d/c and f/u instructiosn, has close PCP follow up.    MDM   Nursing notes and VS reviewed. RX provided.  Plan PCP f/u        Sunnie Nielsen, MD 06/24/12 [phone]

## 2012-07-25 ENCOUNTER — Other Ambulatory Visit (HOSPITAL_COMMUNITY): Payer: Self-pay | Admitting: Internal Medicine

## 2012-07-25 ENCOUNTER — Ambulatory Visit (HOSPITAL_COMMUNITY)
Admission: RE | Admit: 2012-07-25 | Discharge: 2012-07-25 | Disposition: A | Discharge status: Home / Self Care | Payer: BC Managed Care – PPO | Source: Ambulatory Visit | Attending: Internal Medicine | Admitting: Internal Medicine

## 2012-07-25 DIAGNOSIS — R52 Pain, unspecified: Secondary | ICD-10-CM

## 2012-07-25 DIAGNOSIS — M25569 Pain in unspecified knee: Secondary | ICD-10-CM | POA: Insufficient documentation

## 2012-08-14 ENCOUNTER — Ambulatory Visit (INDEPENDENT_AMBULATORY_CARE_PROVIDER_SITE_OTHER): Payer: BC Managed Care – PPO | Admitting: Orthopedic Surgery

## 2012-08-14 ENCOUNTER — Encounter: Payer: Self-pay | Admitting: Orthopedic Surgery

## 2012-08-14 VITALS — BP 120/70 | Ht 69.0 in | Wt 283.0 lb

## 2012-08-14 DIAGNOSIS — G5790 Unspecified mononeuropathy of unspecified lower limb: Secondary | ICD-10-CM

## 2012-08-14 DIAGNOSIS — M792 Neuralgia and neuritis, unspecified: Secondary | ICD-10-CM

## 2012-08-14 MED ORDER — GABAPENTIN 100 MG PO CAPS: 100.0000 mg | ORAL_CAPSULE | Freq: Every day | ORAL | Status: DC

## 2012-08-14 NOTE — Patient Instructions (Addendum)
Start new med at bedtime. Med has been sent to your pharmacy.  Neuralgia   Peripheral Neuropathy Peripheral neuropathy is a common disorder of your nerves resulting from damage. CAUSES   This disorder may be caused by a disease of the nerves or illness. Many neuropathies have well known causes such as:  Diabetes. This is one of the most common causes.     Uremia.    AIDS.    Nutritional deficiencies.     Other causes include mechanical pressures. These may be from:     Compression.    Injury.    Contusions or bruises.     Fracture or dislocated bones.     Pressure involving the nerves close to the surface. Nerves such as the ulnar, or radial can be injured by prolonged use of crutches.  Other injuries may come from:  Tumor.     Hemorrhage or bleeding into a nerve.     Exposure to cold or radiation.     Certain medicines or toxic substances (rare).     Vascular or collagen disorders such as:     Atherosclerosis.    Systemic lupus erythematosus.     Scleroderma.    Sarcoidosis.    Rheumatoid arthritis.     Polyarteritis nodosa.     A large number of cases are of unknown cause.  SYMPTOMS   Common problems include:  Weakness.     Numbness.    Abnormal sensations (paresthesia) such as:     Burning.    Tickling.    Pricking.    Tingling.    Pain in the arms, hands, legs and/or feet.  TREATMENT   Therapy for this disorder differs depending on the cause. It may vary from medical treatment with medications or physical therapy among others.    For example, therapy for this disorder caused by diabetes involves control of the diabetes.     In cases where a tumor or ruptured disc is the cause, therapy may involve surgery. This would be to remove the tumor or to repair the ruptured disc.     In entrapment or compression neuropathy, treatment may consist of splinting or surgical decompression of the ulnar or median nerves. A common example of entrapment  neuropathy is carpal tunnel syndrome. This has become more common because of the increasing use of computers.     Peroneal and radial compression neuropathies may require avoidance of pressure.     Physical therapy and/or splints may be useful in preventing contractures. This is a condition in which shortened muscles around joints cause abnormal and sometimes painful positioning of the joints.  Document Released: 10/14/2002 Document Revised: 07/06/2011 Document Reviewed: 10/24/2005 [facility] Patient Information 2012 [place], [place].Peripheral Neuropathy Peripheral neuropathy is a common disorder of your nerves resulting from damage. CAUSES   This disorder may be caused by a disease of the nerves or illness. Many neuropathies have well known causes such as:  Diabetes. This is one of the most common causes.     Uremia.    AIDS.    Nutritional deficiencies.     Other causes include mechanical pressures. These may be from:     Compression.    Injury.    Contusions or bruises.     Fracture or dislocated bones.     Pressure involving the nerves close to the surface. Nerves such as the ulnar, or radial can be injured by prolonged use of crutches.  Other injuries may come from:  Tumor.  Hemorrhage or bleeding into a nerve.     Exposure to cold or radiation.     Certain medicines or toxic substances (rare).     Vascular or collagen disorders such as:     Atherosclerosis.    Systemic lupus erythematosus.     Scleroderma.    Sarcoidosis.    Rheumatoid arthritis.     Polyarteritis nodosa.     A large number of cases are of unknown cause.  SYMPTOMS   Common problems include:  Weakness.     Numbness.    Abnormal sensations (paresthesia) such as:     Burning.    Tickling.    Pricking.    Tingling.    Pain in the arms, hands, legs and/or feet.  TREATMENT   Therapy for this disorder differs depending on the cause. It may vary from medical treatment with  medications or physical therapy among others.    For example, therapy for this disorder caused by diabetes involves control of the diabetes.     In cases where a tumor or ruptured disc is the cause, therapy may involve surgery. This would be to remove the tumor or to repair the ruptured disc.     In entrapment or compression neuropathy, treatment may consist of splinting or surgical decompression of the ulnar or median nerves. A common example of entrapment neuropathy is carpal tunnel syndrome. This has become more common because of the increasing use of computers.     Peroneal and radial compression neuropathies may require avoidance of pressure.     Physical therapy and/or splints may be useful in preventing contractures. This is a condition in which shortened muscles around joints cause abnormal and sometimes painful positioning of the joints.  Document Released: 10/14/2002 Document Revised: 07/06/2011 Document Reviewed: 10/24/2005 [facility] Patient Information 2012 [place], [place].

## 2012-08-15 ENCOUNTER — Encounter: Payer: Self-pay | Admitting: Orthopedic Surgery

## 2012-08-15 DIAGNOSIS — M792 Neuralgia and neuritis, unspecified: Secondary | ICD-10-CM | POA: Insufficient documentation

## 2012-08-15 NOTE — Progress Notes (Signed)
Patient ID: Roberto Guerra, male   DOB: 1961-10-21, 51 y.o.   MRN: [id number] Chief Complaint  Patient presents with  . Knee Pain    right knee pain that radiates down leg, sudden onset August 2013    Referral by Dr. 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 There may be some tingling no locking no swelling of the knee  Review of systems recorded and reviewed see the scanned documents  Of importance urinary and GI tracts are normal. Fever night sweats fatigue denied.  Vital signs recorded. BP 120/70  Ht 5\' 9"  (1.753 m)  Wt 283 lb (128.368 kg)  BMI 41.79 kg/m2 The patient does have some obesity with a high BMI of 41.7. He denies back pain however his appearance is otherwise normal. He is oriented x3 his mood and affect are normal he walks with a normal gait  His upper extremities are normal his left lower extremity is normal  His right knee has no effusion no tenderness full range of motion knee stable strength normal skin intact  His right lower leg harvest tender in the lateral compartment starting at the fibular head and progressing down approximately 2/3 of the length of the lateral compartment sensory exam remains intact no lymph nodes are palpable no reflexes or pathologic deep tendon reflexes are normal and equal right to left and balance is normal  Straight leg raise is negative  Knee x-ray is notable for no acute disease mild degenerative changes medially but he is hurting laterally outside of the joint and below the knee joint  Impression neuritis/neuralgia most likely peroneal branch of the sciatic nerve. 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 If no improvement, recommend neurology followup and or lumbar spine films plus or minus steroid Dosepak  At this point his knee looks fine except for some mild degenerative changes and clinically this is not the area or source of his pain and I do not think surgery is needed at this time

## 2012-12-13 ENCOUNTER — Other Ambulatory Visit (HOSPITAL_COMMUNITY): Payer: Self-pay | Admitting: Internal Medicine

## 2012-12-13 DIAGNOSIS — IMO0002 Reserved for concepts with insufficient information to code with codable children: Secondary | ICD-10-CM

## 2012-12-13 DIAGNOSIS — R209 Unspecified disturbances of skin sensation: Secondary | ICD-10-CM

## 2012-12-13 DIAGNOSIS — M5430 Sciatica, unspecified side: Secondary | ICD-10-CM

## 2012-12-14 ENCOUNTER — Other Ambulatory Visit (HOSPITAL_COMMUNITY): Payer: BC Managed Care – PPO

## 2012-12-18 ENCOUNTER — Ambulatory Visit (HOSPITAL_COMMUNITY)
Admission: RE | Admit: 2012-12-18 | Discharge: 2012-12-18 | Disposition: A | Discharge status: Home / Self Care | Payer: Managed Care, Other (non HMO) | Source: Ambulatory Visit | Attending: Internal Medicine | Admitting: Internal Medicine

## 2012-12-18 DIAGNOSIS — M5137 Other intervertebral disc degeneration, lumbosacral region: Secondary | ICD-10-CM | POA: Insufficient documentation

## 2012-12-18 DIAGNOSIS — M545 Low back pain, unspecified: Secondary | ICD-10-CM | POA: Insufficient documentation

## 2012-12-18 DIAGNOSIS — M51379 Other intervertebral disc degeneration, lumbosacral region without mention of lumbar back pain or lower extremity pain: Secondary | ICD-10-CM | POA: Insufficient documentation

## 2012-12-18 DIAGNOSIS — M48061 Spinal stenosis, lumbar region without neurogenic claudication: Secondary | ICD-10-CM | POA: Insufficient documentation

## 2012-12-18 DIAGNOSIS — IMO0002 Reserved for concepts with insufficient information to code with codable children: Secondary | ICD-10-CM

## 2012-12-18 DIAGNOSIS — R209 Unspecified disturbances of skin sensation: Secondary | ICD-10-CM

## 2012-12-18 DIAGNOSIS — M5430 Sciatica, unspecified side: Secondary | ICD-10-CM

## 2013-12-19 ENCOUNTER — Encounter (INDEPENDENT_AMBULATORY_CARE_PROVIDER_SITE_OTHER): Payer: Self-pay | Admitting: *Deleted

## 2014-01-01 ENCOUNTER — Other Ambulatory Visit (INDEPENDENT_AMBULATORY_CARE_PROVIDER_SITE_OTHER): Payer: Self-pay | Admitting: *Deleted

## 2014-01-01 ENCOUNTER — Telehealth (INDEPENDENT_AMBULATORY_CARE_PROVIDER_SITE_OTHER): Payer: Self-pay | Admitting: *Deleted

## 2014-01-01 ENCOUNTER — Encounter (INDEPENDENT_AMBULATORY_CARE_PROVIDER_SITE_OTHER): Payer: Self-pay | Admitting: *Deleted

## 2014-01-01 DIAGNOSIS — Z1211 Encounter for screening for malignant neoplasm of colon: Secondary | ICD-10-CM

## 2014-01-01 MED ORDER — PEG-KCL-NACL-NASULF-NA ASC-C 100 G PO SOLR: 1.0000 | Freq: Once | ORAL | Status: DC

## 2014-01-01 NOTE — Telephone Encounter (Signed)
Patient needs movi prep 

## 2014-01-30 ENCOUNTER — Telehealth (INDEPENDENT_AMBULATORY_CARE_PROVIDER_SITE_OTHER): Payer: Self-pay | Admitting: *Deleted

## 2014-01-30 NOTE — Telephone Encounter (Signed)
  Procedure: tcs  Reason/Indication:  screening  Has patient had this procedure before?  no  If so, when, by whom and where?    Is there a family history of colon cancer?  no  Who?  What age when diagnosed?    Is patient diabetic?   no      Does patient have prosthetic heart valve?  no  Do you have a pacemaker?  no  Has patient ever had endocarditis? no  Has patient had joint replacement within last 12 months?  no  Does patient tend to be constipated or take laxatives? no  Is patient on Coumadin, Plavix and/or Aspirin? no  Medications: pravachol 40 mg daily, naproxen 500 mg bid, lisinopril/hctz 20/12.5 mg daily, hydrocodone/acetamin 5/325 mg prn for pain  Allergies: nkda  Medication Adjustment:   Procedure date & time: 02/27/14 at 830

## 2014-01-30 NOTE — Telephone Encounter (Signed)
agree

## 2014-02-12 ENCOUNTER — Encounter (HOSPITAL_COMMUNITY): Payer: Self-pay | Admitting: Pharmacy Technician

## 2014-02-14 ENCOUNTER — Encounter (HOSPITAL_COMMUNITY): Payer: Self-pay | Admitting: Pharmacy Technician

## 2014-02-27 ENCOUNTER — Encounter (HOSPITAL_COMMUNITY): Payer: Self-pay | Admitting: *Deleted

## 2014-02-27 ENCOUNTER — Encounter (HOSPITAL_COMMUNITY)
Admission: RE | Disposition: A | Discharge status: Home / Self Care | Payer: Self-pay | Source: Ambulatory Visit | Attending: Internal Medicine

## 2014-02-27 ENCOUNTER — Ambulatory Visit (HOSPITAL_COMMUNITY)
Admission: RE | Admit: 2014-02-27 | Discharge: 2014-02-27 | Disposition: A | Discharge status: Home / Self Care | Payer: Managed Care, Other (non HMO) | Source: Ambulatory Visit | Attending: Internal Medicine | Admitting: Internal Medicine

## 2014-02-27 DIAGNOSIS — Z1211 Encounter for screening for malignant neoplasm of colon: Secondary | ICD-10-CM | POA: Insufficient documentation

## 2014-02-27 DIAGNOSIS — D126 Benign neoplasm of colon, unspecified: Secondary | ICD-10-CM | POA: Insufficient documentation

## 2014-02-27 DIAGNOSIS — K644 Residual hemorrhoidal skin tags: Secondary | ICD-10-CM | POA: Insufficient documentation

## 2014-02-27 DIAGNOSIS — K5730 Diverticulosis of large intestine without perforation or abscess without bleeding: Secondary | ICD-10-CM | POA: Insufficient documentation

## 2014-02-27 DIAGNOSIS — Q2733 Arteriovenous malformation of digestive system vessel: Secondary | ICD-10-CM | POA: Insufficient documentation

## 2014-02-27 HISTORY — PX: COLONOSCOPY: SHX5424

## 2014-02-27 SURGERY — COLONOSCOPY
Anesthesia: Moderate Sedation

## 2014-02-27 MED ORDER — MEPERIDINE HCL 50 MG/ML IJ SOLN
INTRAMUSCULAR | Status: DC | PRN
  Administered 2014-02-27 (×2): 25 mg via INTRAVENOUS

## 2014-02-27 MED ORDER — MEPERIDINE HCL 50 MG/ML IJ SOLN
INTRAMUSCULAR | Status: AC
  Filled 2014-02-27: qty 1

## 2014-02-27 MED ORDER — STERILE WATER FOR IRRIGATION IR SOLN
Status: DC | PRN
  Administered 2014-02-27: 08:00:00

## 2014-02-27 MED ORDER — MIDAZOLAM HCL 5 MG/5ML IJ SOLN
INTRAMUSCULAR | Status: AC
  Filled 2014-02-27: qty 10

## 2014-02-27 MED ORDER — MIDAZOLAM HCL 5 MG/5ML IJ SOLN
INTRAMUSCULAR | Status: DC | PRN
  Administered 2014-02-27: 3 mg via INTRAVENOUS
  Administered 2014-02-27 (×3): 2 mg via INTRAVENOUS

## 2014-02-27 MED ORDER — SODIUM CHLORIDE 0.9 % IV SOLN
INTRAVENOUS | Status: DC
  Administered 2014-02-27: 08:00:00 via INTRAVENOUS

## 2014-02-27 NOTE — Op Note (Signed)
COLONOSCOPY PROCEDURE REPORT  PATIENT:  Roberto Guerra  MR#:  [id number] Birthdate:  1961/10/16, 53 y.o., male Endoscopist:  Dr. Rogene Houston, MD Referred By:  Dr. Wende Neighbors, MD Procedure Date: 02/27/2014  Procedure:   Colonoscopy  Indications:  Patient is 53 year old African male who  is undergoing average risk screening colonoscopy.  Informed Consent:  The procedure and risks were reviewed with the patient and informed consent was obtained.  Medications:  Demerol 50 mg IV Versed 9 mg IV  Description of procedure:  After a digital rectal exam was performed, that colonoscope was advanced from the anus through the rectum and colon to the area of the cecum, ileocecal valve and appendiceal orifice. The cecum was deeply intubated. These structures were well-seen and photographed for the record. From the level of the cecum and ileocecal valve, the scope was slowly and cautiously withdrawn. The mucosal surfaces were carefully surveyed utilizing scope tip to flexion to facilitate fold flattening as needed. The scope was pulled down into the rectum where a thorough exam including retroflexion was performed.  Findings:   Prep excellent. Small ulcerated polyp at cecum. It was ablated via cold biopsy. Continuous oozing from biopsy site eventually controlled with application of single resolution clip. 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 Mild sigmoid colon diverticulosis. Normal rectal mucosa. Small hemorrhoids below the dentate line.   Therapeutic/Diagnostic Maneuvers Performed:  See above  Complications:  See above  Cecal Withdrawal Time:  14  minutes  Impression:  Examination performed to cecum. Small ulcerated polyp ablated via cold biopsy from cecum. Single resolution clip applied to biopsy site to control bleeding. 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 Mild sigmoid colon diverticulosis. Small external hemorrhoids.  Recommendations:  Standard instructions  given. Patient advised not to undergo MRI until clip has passed I will contact patient with biopsy results and further recommendations.  Rogene Houston  02/27/2014 9:02 AM  CC: Dr. Delphina Cahill, MD & Dr. Rayne Du ref. provider found

## 2014-02-27 NOTE — Discharge Instructions (Signed)
Resume usual medications but to not take naproxen for 2 days. High fiber diet. No driving for 24 hours. Physician will call with biopsy results. Remember you cannot have an MRI until clip has passed     Colonoscopy, Care After  Refer to this sheet in the next few weeks. These instructions provide you with information on caring for yourself after your procedure. Your health care provider may also give you more specific instructions. Your treatment has been planned according to current medical practices, but problems sometimes occur. Call your health care provider if you have any problems or questions after your procedure. WHAT TO EXPECT AFTER THE PROCEDURE  After your procedure, it is typical to have the following:  A small amount of blood in your stool.  Moderate amounts of gas and mild abdominal cramping or bloating. HOME CARE INSTRUCTIONS  Do not drive, operate machinery, or sign important documents for 24 hours.  You may shower and resume your regular physical activities, but move at a slower pace for the first 24 hours.  Take frequent rest periods for the first 24 hours.  Drink enough fluids to keep your urine clear or pale yellow.  You may resume your normal diet as instructed by your health care provider. Avoid heavy or fried foods that are hard to digest.  Avoid drinking alcohol for 24 hours or as instructed by your health care provider.  Only take over-the-counter or prescription medicines as directed by your health care provider.  If a tissue sample (biopsy) was taken during your procedure:  Do not take aspirin or blood thinners for 7 days, or as instructed by your health care provider.  Do not drink alcohol for 7 days, or as instructed by your health care provider.  Eat soft foods for the first 24 hours. SEEK MEDICAL CARE IF  You have persistent spotting of blood in your stool 2 3 days after the procedure. SEEK IMMEDIATE MEDICAL CARE IF:   You have more than a  small spotting of blood in your stool.  You pass large blood clots in your stool.  Your abdomen is swollen (distended).  You have nausea or vomiting.  You have a fever.  You have increasing abdominal pain that is not relieved with medicine.   Colon Polyps  Polyps are lumps of extra tissue growing inside the body. Polyps can grow in the large intestine (colon). Most colon polyps are noncancerous (benign). However, some colon polyps can become cancerous over time. Polyps that are larger than a pea may be harmful. To be safe, caregivers remove and test all polyps.  CAUSES   Polyps form when mutations in the genes cause your cells to grow and divide even though no more tissue is needed.  RISK FACTORS  There are a number of risk factors that can increase your chances of getting colon polyps. They include:  Being older than 50 years.  Family history of colon polyps or colon cancer.  Long-term colon diseases, such as colitis or Crohn disease.  Being overweight.  Smoking.  Being inactive.  Drinking too much alcohol.           SYMPTOMS   Most small polyps do not cause symptoms. If symptoms are present, they may include:  Blood in the stool. The stool may look dark red or black.  Constipation or diarrhea that lasts longer than 1 week.           DIAGNOSIS People often do not know they have polyps until their  caregiver finds them during a regular checkup. Your caregiver can use 4 tests to check for polyps:  Digital rectal exam. The caregiver wears gloves and feels inside the rectum. This test would find polyps only in the rectum.  Barium enema. The caregiver puts a liquid called barium into your rectum before taking X-rays of your colon. Barium makes your colon look white. Polyps are dark, so they are easy to see in the X-ray pictures.  Sigmoidoscopy. A thin, flexible tube (sigmoidoscope) is placed into your rectum. The sigmoidoscope has a light and tiny camera in it. The  caregiver uses the sigmoidoscope to look at the last third of your colon.  Colonoscopy. This test is like sigmoidoscopy, but the caregiver looks at the entire colon. This is the most common method for finding and removing polyps.   TREATMENT   Any polyps will be removed during a sigmoidoscopy or colonoscopy. The polyps are then tested for cancer.  PREVENTION   To help lower your risk of getting more colon polyps:  Eat plenty of fruits and vegetables. Avoid eating fatty foods.  Do not smoke.  Avoid drinking alcohol.  Exercise every day.  Lose weight if recommended by your caregiver.  Eat plenty of calcium and folate. Foods that are rich in calcium include milk, cheese, and broccoli. Foods that are rich in folate include chickpeas, kidney beans, and spinach.   HOME CARE INSTRUCTIONS  Keep all follow-up appointments as directed by your caregiver. You may need periodic exams to check for polyps.  SEEK MEDICAL CARE IF:  You notice bleeding during a bowel movement.

## 2014-02-27 NOTE — H&P (Signed)
Roberto Guerra is an 53 y.o. male.   Chief Complaint: Patient is  here for colonoscopy. HPI: Patient is 53 year old African American male who is here for screening colonoscopy. He denies abdominal pain changes polyps or rectal bleeding.  Family history is negative for CRC to  Past Medical History  Diagnosis Date  . Hypertension   . Hyperlipidemia   . Gout     Past Surgical History  Procedure Laterality Date  . No past surgeries      Family History  Problem Relation Age of Onset  . Diabetes    . Colon cancer Neg Hx    Social History:  reports that he has quit smoking. His smoking use included Cigarettes. He has a .25 pack-year smoking history. He does not have any smokeless tobacco history on file. He reports that he does not drink alcohol or use illicit drugs.  Allergies: No Known Allergies  Medications Prior to Admission  Medication Sig Dispense Refill  . allopurinol (ZYLOPRIM) 300 MG tablet Take 300 mg by mouth daily.        Marland Kitchen gabapentin (NEURONTIN) 300 MG capsule Take 300 mg by mouth daily.      Marland Kitchen HYDROcodone-acetaminophen (NORCO/VICODIN) 5-325 MG per tablet Take 1 tablet by mouth every 6 (six) hours as needed for moderate pain.      Marland Kitchen lisinopril-hydrochlorothiazide (PRINZIDE,ZESTORETIC) 20-12.5 MG per tablet Take 1 tablet by mouth daily.        . naproxen (NAPROSYN) 500 MG tablet Take 500 mg by mouth 2 (two) times daily with a meal.      . peg 3350 powder (MOVIPREP) 100 G SOLR Take 1 kit (200 g total) by mouth once.  1 kit  0  . pravastatin (PRAVACHOL) 40 MG tablet Take 40 mg by mouth at bedtime.         No results found for this or any previous visit (from the past 48 hour(s)). No results found.  ROS  Blood pressure 132/85, pulse 81, temperature 98.1 F (36.7 C), temperature source Oral, resp. rate 18, height _0  (1.676 m), weight 291 lb (131.997 kg), SpO2 97.00%. Physical Exam  Constitutional: He appears well-developed and well-nourished.  HENT:  Mouth/Throat:  Oropharynx is clear and moist.  Eyes: Conjunctivae are normal. No scleral icterus.  Neck: No thyromegaly present.  Cardiovascular: Normal rate, regular rhythm and normal heart sounds.   No murmur heard. Respiratory: Effort normal and breath sounds normal.  GI: Soft. He exhibits no distension and no mass. There is no tenderness.  Musculoskeletal: He exhibits no edema.  Lymphadenopathy:    He has no cervical adenopathy.  Neurological: He is alert.  Skin: Skin is warm and dry.     Assessment/Plan Average risk screening colonoscopy.  Barbaraann Avans U Diania Co 02/27/2014, 8:22 AM

## 2014-03-03 ENCOUNTER — Encounter (HOSPITAL_COMMUNITY): Payer: Self-pay | Admitting: Internal Medicine

## 2014-03-04 ENCOUNTER — Encounter (INDEPENDENT_AMBULATORY_CARE_PROVIDER_SITE_OTHER): Payer: Self-pay | Admitting: *Deleted

## 2016-07-10 ENCOUNTER — Encounter (HOSPITAL_COMMUNITY): Payer: Self-pay | Admitting: Emergency Medicine

## 2016-07-10 ENCOUNTER — Emergency Department (HOSPITAL_COMMUNITY)
Admission: EM | Admit: 2016-07-10 | Discharge: 2016-07-10 | Disposition: A | Discharge status: Home / Self Care | Payer: 59 | Attending: Emergency Medicine | Admitting: Emergency Medicine

## 2016-07-10 DIAGNOSIS — Z79899 Other long term (current) drug therapy: Secondary | ICD-10-CM | POA: Insufficient documentation

## 2016-07-10 DIAGNOSIS — Z87891 Personal history of nicotine dependence: Secondary | ICD-10-CM | POA: Insufficient documentation

## 2016-07-10 DIAGNOSIS — M25561 Pain in right knee: Secondary | ICD-10-CM | POA: Insufficient documentation

## 2016-07-10 DIAGNOSIS — I10 Essential (primary) hypertension: Secondary | ICD-10-CM | POA: Diagnosis not present

## 2016-07-10 MED ORDER — IBUPROFEN 800 MG PO TABS: 800.0000 mg | ORAL_TABLET | Freq: Three times a day (TID) | ORAL | 0 refills | Status: DC

## 2016-07-10 MED ORDER — IBUPROFEN 800 MG PO TABS
800.0000 mg | ORAL_TABLET | Freq: Once | ORAL | Status: AC
  Administered 2016-07-10: 800 mg via ORAL
  Filled 2016-07-10: qty 1

## 2016-07-10 NOTE — ED Provider Notes (Signed)
[place] DEPT Provider Note   CSN: [id number] Arrival date & time: 07/10/16  0606     History   Chief Complaint Chief Complaint  Patient presents with  . Leg Pain    R leg giving out x 1 week    HPI Roberto Guerra is a 55 y.o. male.   Knee Pain   This is a new problem. The current episode started more than 2 days ago. The problem occurs constantly. The problem has not changed since onset.The pain is present in the right knee. The quality of the pain is described as aching and dull. The pain is at a severity of 3/10. The pain is mild. Associated symptoms include stiffness. Pertinent negatives include no numbness, full range of motion and no tingling. He has tried nothing for the symptoms.    Past Medical History:  Diagnosis Date  . Gout   . Hyperlipidemia   . Hypertension     Patient Active Problem List   Diagnosis Date Noted  . Neuralgia of lower extremity 08/15/2012  . ERECTILE DYSFUNCTION 08/29/2008  . CERUMEN IMPACTION, BILATERAL 06/27/2008  . GOUT 09/21/2007  . HYPERLIPIDEMIA 10/12/2006  . OBESITY NOS 10/12/2006  . HYPERTENSION 10/12/2006  . GERD 10/12/2006  . HIATAL HERNIA 10/12/2006    Past Surgical History:  Procedure Laterality Date  . COLONOSCOPY N/A 02/27/2014   Procedure: COLONOSCOPY;  Surgeon: Rogene Houston, MD;  Location: [facility];  Service: Endoscopy;  Laterality: N/A;  830  . NO PAST SURGERIES         Home Medications    Prior to Admission medications   Medication Sig Start Date End Date Taking? Authorizing Provider  allopurinol (ZYLOPRIM) 300 MG tablet Take 300 mg by mouth daily.      Historical Provider, MD  gabapentin (NEURONTIN) 300 MG capsule Take 300 mg by mouth daily.    Historical Provider, MD  HYDROcodone-acetaminophen (NORCO/VICODIN) 5-325 MG per tablet Take 1 tablet by mouth every 6 (six) hours as needed for moderate pain.    Historical Provider, MD  ibuprofen (ADVIL,MOTRIN) 800 MG tablet Take 1 tablet (800 mg total)  by mouth 3 (three) times daily. 07/10/16   Merrily Pew, MD  lisinopril-hydrochlorothiazide (PRINZIDE,ZESTORETIC) 20-12.5 MG per tablet Take 1 tablet by mouth daily.      Historical Provider, MD  pravastatin (PRAVACHOL) 40 MG tablet Take 40 mg by mouth at bedtime.     Historical Provider, MD    Family History Family History  Problem Relation Age of Onset  . Diabetes    . Colon cancer Neg Hx     Social History Social History  Substance Use Topics  . Smoking status: Former Smoker    Packs/day: 0.25    Years: 1.00    Types: Cigarettes  . Smokeless tobacco: Never Used  . Alcohol use No     Allergies   Review of patient's allergies indicates no known allergies.   Review of Systems Review of Systems  Constitutional: Negative for chills, fever and unexpected weight change.  Musculoskeletal: Positive for stiffness.  Neurological: Negative for tingling and numbness.  All other systems reviewed and are negative.    Physical Exam Updated Vital Signs BP 141/84 (BP Location: Right Arm)   Pulse 66   Temp 98.3 F (36.8 C) (Oral)   Ht 5\' 6"  (1.676 m)   Wt (!) 304 lb (137.9 kg)   SpO2 96%   BMI 49.07 kg/m   Physical Exam  Constitutional: He appears well-developed and  well-nourished.  HENT:  Head: Normocephalic and atraumatic.  Eyes: Conjunctivae are normal.  Neck: Neck supple.  Cardiovascular: Normal rate and regular rhythm.   No murmur heard. Pulmonary/Chest: Effort normal and breath sounds normal. No respiratory distress.  Abdominal: Soft. There is no tenderness.  Musculoskeletal: Normal range of motion. He exhibits tenderness. He exhibits no edema or deformity.  Pain on interior aspect of right knee with internal rotation of foot.  Negative anterior/posterior drawer.  No ttp with weight loading.   Neurological: He is alert.  Skin: Skin is warm and dry. No rash noted.  Psychiatric: He has a normal mood and affect.  Nursing note and vitals reviewed.    ED  Treatments / Results  Labs (all labs ordered are listed, but only abnormal results are displayed) Labs Reviewed - No data to display  EKG  EKG Interpretation None       Radiology No results found.  Procedures Procedures (including critical care time)  Medications Ordered in ED Medications  ibuprofen (ADVIL,MOTRIN) tablet 800 mg (800 mg Oral Given 07/10/16 [id number])     Initial Impression / Assessment and Plan / ED Course  I have reviewed the triage vital signs and the nursing notes.  Pertinent labs & imaging results that were available during my care of the patient were reviewed by me and considered in my medical decision making (see chart for details).  Clinical Course    Right knee pain. No s/s of septic arthritis. No s/s fracture. No s/s cellulitis. No swelling or risk factors for DVT. OA vs msk strain most likely will dc with supportive care and NSAIDs, fu pcp in one week if not improved.   Final Clinical Impressions(s) / ED Diagnoses   Final diagnoses:  Right knee pain    New Prescriptions Discharge Medication List as of 07/10/2016  6:31 AM    START taking these medications   Details  ibuprofen (ADVIL,MOTRIN) 800 MG tablet Take 1 tablet (800 mg total) by mouth 3 (three) times daily., Starting Sun 07/10/2016, Print         Merrily Pew, MD 07/10/16 [phone]

## 2016-07-10 NOTE — ED Triage Notes (Signed)
Pt of Dr Nevada Crane, 1 week hx of R leg pain with leg giving out. He walks heels to toe without stagger drift or change of rythmn of gait- His movements are easy

## 2017-02-19 ENCOUNTER — Emergency Department (HOSPITAL_COMMUNITY)
Admission: EM | Admit: 2017-02-19 | Discharge: 2017-02-19 | Disposition: A | Discharge status: Home / Self Care | Payer: 59 | Attending: Emergency Medicine | Admitting: Emergency Medicine

## 2017-02-19 ENCOUNTER — Emergency Department (HOSPITAL_COMMUNITY): Payer: 59

## 2017-02-19 ENCOUNTER — Encounter (HOSPITAL_COMMUNITY): Payer: Self-pay | Admitting: *Deleted

## 2017-02-19 DIAGNOSIS — Z79899 Other long term (current) drug therapy: Secondary | ICD-10-CM | POA: Insufficient documentation

## 2017-02-19 DIAGNOSIS — J189 Pneumonia, unspecified organism: Secondary | ICD-10-CM

## 2017-02-19 DIAGNOSIS — J181 Lobar pneumonia, unspecified organism: Secondary | ICD-10-CM | POA: Diagnosis not present

## 2017-02-19 DIAGNOSIS — Z87891 Personal history of nicotine dependence: Secondary | ICD-10-CM | POA: Insufficient documentation

## 2017-02-19 DIAGNOSIS — I10 Essential (primary) hypertension: Secondary | ICD-10-CM | POA: Insufficient documentation

## 2017-02-19 DIAGNOSIS — R0602 Shortness of breath: Secondary | ICD-10-CM | POA: Diagnosis present

## 2017-02-19 LAB — BASIC METABOLIC PANEL
Anion gap: 8 (ref 5–15)
BUN: 15 mg/dL (ref 6–20)
CO2: 27 mmol/L (ref 22–32)
Calcium: 9.8 mg/dL (ref 8.9–10.3)
Chloride: 109 mmol/L (ref 101–111)
Creatinine, Ser: 1.03 mg/dL (ref 0.61–1.24)
GFR calc Af Amer: >60 mL/min (ref >60)
GFR calc non Af Amer: >60 mL/min (ref >60)
Glucose, Bld: 137 mg/dL — ABNORMAL HIGH (ref 65–99)
Potassium: 3.6 mmol/L (ref 3.5–5.1)
Sodium: 144 mmol/L (ref 135–145)

## 2017-02-19 LAB — CBC WITH DIFFERENTIAL/PLATELET
Basophils Absolute: 0 10*3/uL (ref 0.0–0.1)
Basophils Relative: 0 %
Eosinophils Absolute: 0.4 10*3/uL (ref 0.0–0.7)
Eosinophils Relative: 4 %
HCT: 41 % (ref 39.0–52.0)
Hemoglobin: 13.3 g/dL (ref 13.0–17.0)
Lymphocytes Relative: 35 %
Lymphs Abs: 3 10*3/uL (ref 0.7–4.0)
MCH: 30 pg (ref 26.0–34.0)
MCHC: 32.4 g/dL (ref 30.0–36.0)
MCV: 92.6 fL (ref 78.0–100.0)
Monocytes Absolute: 0.7 10*3/uL (ref 0.1–1.0)
Monocytes Relative: 8 %
Neutro Abs: 4.5 10*3/uL (ref 1.7–7.7)
Neutrophils Relative %: 53 %
Platelets: 183 10*3/uL (ref 150–400)
RBC: 4.43 MIL/uL (ref 4.22–5.81)
RDW: 14 % (ref 11.5–15.5)
WBC: 8.6 10*3/uL (ref 4.0–10.5)

## 2017-02-19 MED ORDER — ALBUTEROL SULFATE HFA 108 (90 BASE) MCG/ACT IN AERS: 2.0000 | INHALATION_SPRAY | Freq: Four times a day (QID) | RESPIRATORY_TRACT | 0 refills | Status: DC | PRN

## 2017-02-19 MED ORDER — DOXYCYCLINE HYCLATE 100 MG PO TABS
100.0000 mg | ORAL_TABLET | Freq: Once | ORAL | Status: AC
  Administered 2017-02-19: 100 mg via ORAL
  Filled 2017-02-19: qty 1

## 2017-02-19 MED ORDER — DOXYCYCLINE HYCLATE 100 MG PO CAPS: 100.0000 mg | ORAL_CAPSULE | Freq: Two times a day (BID) | ORAL | 0 refills | Status: DC

## 2017-02-19 NOTE — ED Notes (Signed)
Pt ambulated in the hallway. Prior to leaving the room, the pt's o2 was 97% RA, dropped to 92% RA while in the hallway and when back in the room, the pt's o2 went back up to 97% RA.

## 2017-02-19 NOTE — ED Triage Notes (Signed)
Pt c/o cough that is productive with green sputum for the past few days along with sob,

## 2017-02-19 NOTE — ED Notes (Signed)
Pt alert and oriented x 4. Stable gait. Pt given discharge papers/prescriptions. Pt told to stop by registration to complete any additional paperwork. Pt left the department with no further questions. 

## 2017-02-19 NOTE — ED Notes (Signed)
Pt reports being a "little" short of breath.

## 2017-02-19 NOTE — Discharge Instructions (Signed)
Take the antibiotics as prescribed. Follow up with Dr. Nevada Crane. Return to the ED if you develop chest pain, shortness of breath, or any other concerns.

## 2017-02-19 NOTE — ED Provider Notes (Signed)
[place] DEPT Provider Note   CSN: [id number] Arrival date & time: 02/19/17  0230     History   Chief Complaint Chief Complaint  Patient presents with  . Shortness of Breath    HPI Roberto Guerra is a 56 y.o. male.  Patient reports cough for the past 2 weeks that has become productive over the past 3 days. Associated with shortness of breath. Cough productive of green sputum with rhinorrhea. Denies fever. Denies chest pain. Denies nausea, vomiting, diarrhea. Denies any history of asthma or COPD. Smoker in the remote past. Denies any cardiac history. Eating and drinking well. Denies sick contacts. He is not taking anything at home. He came in tonight because he felt more short of breath.   The history is provided by the patient.    Past Medical History:  Diagnosis Date  . Gout   . Hyperlipidemia   . Hypertension     Patient Active Problem List   Diagnosis Date Noted  . Neuralgia of lower extremity 08/15/2012  . ERECTILE DYSFUNCTION 08/29/2008  . CERUMEN IMPACTION, BILATERAL 06/27/2008  . GOUT 09/21/2007  . HYPERLIPIDEMIA 10/12/2006  . OBESITY NOS 10/12/2006  . HYPERTENSION 10/12/2006  . GERD 10/12/2006  . HIATAL HERNIA 10/12/2006    Past Surgical History:  Procedure Laterality Date  . COLONOSCOPY N/A 02/27/2014   Procedure: COLONOSCOPY;  Surgeon: Rogene Houston, MD;  Location: [facility];  Service: Endoscopy;  Laterality: N/A;  830  . NO PAST SURGERIES         Home Medications    Prior to Admission medications   Medication Sig Start Date End Date Taking? Authorizing Provider  allopurinol (ZYLOPRIM) 300 MG tablet Take 300 mg by mouth daily.     Yes Historical Provider, MD  gabapentin (NEURONTIN) 300 MG capsule Take 300 mg by mouth daily.   Yes Historical Provider, MD  HYDROcodone-acetaminophen (NORCO/VICODIN) 5-325 MG per tablet Take 1 tablet by mouth every 6 (six) hours as needed for moderate pain.   Yes Historical Provider, MD    lisinopril-hydrochlorothiazide (PRINZIDE,ZESTORETIC) 20-12.5 MG per tablet Take 1 tablet by mouth daily.     Yes Historical Provider, MD  pravastatin (PRAVACHOL) 40 MG tablet Take 40 mg by mouth at bedtime.    Yes Historical Provider, MD  ibuprofen (ADVIL,MOTRIN) 800 MG tablet Take 1 tablet (800 mg total) by mouth 3 (three) times daily. 07/10/16   Merrily Pew, MD    Family History Family History  Problem Relation Age of Onset  . Diabetes    . Colon cancer Neg Hx     Social History Social History  Substance Use Topics  . Smoking status: Former Smoker    Packs/day: 0.25    Years: 1.00    Types: Cigarettes  . Smokeless tobacco: Never Used  . Alcohol use No     Allergies   Patient has no known allergies.   Review of Systems Review of Systems  Constitutional: Negative for activity change, appetite change, fatigue and fever.  HENT: Positive for congestion and rhinorrhea.   Respiratory: Positive for cough and shortness of breath. Negative for chest tightness.   Cardiovascular: Negative for chest pain.  Gastrointestinal: Negative for abdominal pain, nausea and vomiting.  Genitourinary: Negative for dysuria and testicular pain.  Musculoskeletal: Positive for arthralgias and myalgias.  Neurological: Negative for dizziness, weakness and headaches.   A complete 10 system review of systems was obtained and all systems are negative except as noted in the HPI and PMH.  Physical Exam Updated Vital Signs BP (!) 165/94 (BP Location: Left Arm)   Pulse 96   Temp 98.9 F (37.2 C) (Oral)   Resp 20   Ht 5\' 5"  (1.651 m)   Wt 295 lb (133.8 kg)   SpO2 96%   BMI 49.09 kg/m   Physical Exam  Constitutional: He is oriented to person, place, and time. He appears well-developed and well-nourished. No distress.  Nontoxic, no increased work of breathing  HENT:  Head: Normocephalic and atraumatic.  Mouth/Throat: Oropharynx is clear and moist. No oropharyngeal exudate.  Eyes: Conjunctivae  and EOM are normal. Pupils are equal, round, and reactive to light.  Neck: Normal range of motion. Neck supple.  No meningismus.  Cardiovascular: Normal rate, regular rhythm, normal heart sounds and intact distal pulses.   No murmur heard. Pulmonary/Chest: Effort normal and breath sounds normal. No respiratory distress. He has no wheezes. He exhibits no tenderness.  Abdominal: Soft. There is no tenderness. There is no rebound and no guarding.  Musculoskeletal: Normal range of motion. He exhibits no edema or tenderness.  Neurological: He is alert and oriented to person, place, and time. No cranial nerve deficit. He exhibits normal muscle tone. Coordination normal.  No ataxia on finger to nose bilaterally. No pronator drift. 5/5 strength throughout. CN 2-12 intact.Equal grip strength. Sensation intact.   Skin: Skin is warm.  Psychiatric: He has a normal mood and affect. His behavior is normal.  Nursing note and vitals reviewed.    ED Treatments / Results  Labs (all labs ordered are listed, but only abnormal results are displayed) Labs Reviewed  BASIC METABOLIC PANEL - Abnormal; Notable for the following:       Result Value   Glucose, Bld 137 (*)    All other components within normal limits  CBC WITH DIFFERENTIAL/PLATELET    EKG  EKG Interpretation  Date/Time:  Sunday February 19 2017 03:34:25 EDT Ventricular Rate:  86 PR Interval:    QRS Duration: 93 QT Interval:  386 QTC Calculation: 462 R Axis:   43 Text Interpretation:  Sinus rhythm No previous ECGs available Confirmed by Wyvonnia Dusky  MD, Deidra Spease [phone]) on 02/19/2017 3:40:08 AM       Radiology Dg Chest 2 View  Result Date: 02/19/2017 CLINICAL DATA:  Acute onset of productive cough and shortness of breath. Initial encounter. EXAM: CHEST  2 VIEW COMPARISON:  None. FINDINGS: The lungs are well-aerated. Mild left basilar airspace opacity raises concern for pneumonia. There is no evidence of pleural effusion or pneumothorax. The  heart is normal in size; the mediastinal contour is within normal limits. No acute osseous abnormalities are seen. IMPRESSION: Mild left basilar airspace opacity raises concern for pneumonia. Electronically Signed   By: Garald Balding M.D.   On: 02/19/2017 02:56    Procedures Procedures (including critical care time)  Medications Ordered in ED Medications - No data to display   Initial Impression / Assessment and Plan / ED Course  I have reviewed the triage vital signs and the nursing notes.  Pertinent labs & imaging results that were available during my care of the patient were reviewed by me and considered in my medical decision making (see chart for details).     Cough productive of green mucus with some shortness of breath. No chest pain. EKG normal sinus rhythm.  No wheezing on exam.  X-ray concerning for possible left basilar pneumonia.  Labs reassuring. Patient able to ambulate without desaturation. We'll treat for 3 acquired pneumonia. Curb  65 score is negative. Patient is well and nontoxic appearing.  Follow-up with PCP. Return precautions discussed.  Final Clinical Impressions(s) / ED Diagnoses   Final diagnoses:  Community acquired pneumonia of left lower lobe of lung [facility])    New Prescriptions New Prescriptions   No medications on file     Ezequiel Essex, MD 02/19/17 [phone]

## 2017-07-01 ENCOUNTER — Encounter (HOSPITAL_COMMUNITY): Payer: Self-pay | Admitting: Emergency Medicine

## 2017-07-01 ENCOUNTER — Emergency Department (HOSPITAL_COMMUNITY)
Admission: EM | Admit: 2017-07-01 | Discharge: 2017-07-01 | Disposition: A | Discharge status: Home / Self Care | Payer: 59 | Attending: Emergency Medicine | Admitting: Emergency Medicine

## 2017-07-01 DIAGNOSIS — Z79899 Other long term (current) drug therapy: Secondary | ICD-10-CM | POA: Diagnosis not present

## 2017-07-01 DIAGNOSIS — G5622 Lesion of ulnar nerve, left upper limb: Secondary | ICD-10-CM

## 2017-07-01 DIAGNOSIS — I10 Essential (primary) hypertension: Secondary | ICD-10-CM | POA: Diagnosis not present

## 2017-07-01 DIAGNOSIS — R202 Paresthesia of skin: Secondary | ICD-10-CM | POA: Diagnosis present

## 2017-07-01 DIAGNOSIS — Z87891 Personal history of nicotine dependence: Secondary | ICD-10-CM | POA: Insufficient documentation

## 2017-07-01 DIAGNOSIS — G5621 Lesion of ulnar nerve, right upper limb: Secondary | ICD-10-CM | POA: Insufficient documentation

## 2017-07-01 LAB — CBC WITH DIFFERENTIAL/PLATELET
Basophils Absolute: 0 10*3/uL (ref 0.0–0.1)
Basophils Relative: 0 %
Eosinophils Absolute: 0.3 10*3/uL (ref 0.0–0.7)
Eosinophils Relative: 3 %
HCT: 38.7 % — ABNORMAL LOW (ref 39.0–52.0)
Hemoglobin: 12.6 g/dL — ABNORMAL LOW (ref 13.0–17.0)
Lymphocytes Relative: 31 %
Lymphs Abs: 3.1 10*3/uL (ref 0.7–4.0)
MCH: 30.7 pg (ref 26.0–34.0)
MCHC: 32.6 g/dL (ref 30.0–36.0)
MCV: 94.2 fL (ref 78.0–100.0)
Monocytes Absolute: 0.6 10*3/uL (ref 0.1–1.0)
Monocytes Relative: 6 %
Neutro Abs: 6.1 10*3/uL (ref 1.7–7.7)
Neutrophils Relative %: 60 %
Platelets: 151 10*3/uL (ref 150–400)
RBC: 4.11 MIL/uL — ABNORMAL LOW (ref 4.22–5.81)
RDW: 13.9 % (ref 11.5–15.5)
WBC: 10.1 10*3/uL (ref 4.0–10.5)

## 2017-07-01 LAB — BASIC METABOLIC PANEL
Anion gap: 7 (ref 5–15)
BUN: 15 mg/dL (ref 6–20)
CO2: 29 mmol/L (ref 22–32)
Calcium: 10 mg/dL (ref 8.9–10.3)
Chloride: 107 mmol/L (ref 101–111)
Creatinine, Ser: 0.99 mg/dL (ref 0.61–1.24)
GFR calc Af Amer: >60 mL/min (ref >60)
GFR calc non Af Amer: >60 mL/min (ref >60)
Glucose, Bld: 147 mg/dL — ABNORMAL HIGH (ref 65–99)
Potassium: 3.4 mmol/L — ABNORMAL LOW (ref 3.5–5.1)
Sodium: 143 mmol/L (ref 135–145)

## 2017-07-01 LAB — MAGNESIUM: Magnesium: 1.9 mg/dL (ref 1.7–2.4)

## 2017-07-01 MED ORDER — IBUPROFEN 600 MG PO TABS: 600.0000 mg | ORAL_TABLET | Freq: Three times a day (TID) | ORAL | 0 refills | Status: DC

## 2017-07-01 NOTE — ED Notes (Signed)
Pt works as Librarian, academic at a [employer]  He reports numbness to his R hand for one week

## 2017-07-01 NOTE — ED Triage Notes (Signed)
Pt reports "stinging" in left hand for a week with decreased grip.  States it feels like it is asleep.

## 2017-07-01 NOTE — ED Provider Notes (Signed)
[place] DEPT Provider Note   CSN: [id number] Arrival date & time: 07/01/17  1734     History   Chief Complaint Chief Complaint  Patient presents with  . Numbness    x 1 week    HPI LYNELL KUSSMAN is a 56 y.o. male.  HPI Patient reports paresthesias to his left hand radiating up into his left forearm for the past week. No known trauma. States he woke with his hand feeling this way. Denies any visual, speech changes. No facial asymmetry. Denies neck pain. No fever or chills. States he's had some decreased grip strength in the left hand but no other weakness noted.  Past Medical History:  Diagnosis Date  . Gout   . Hyperlipidemia   . Hypertension     Patient Active Problem List   Diagnosis Date Noted  . Neuralgia of lower extremity 08/15/2012  . ERECTILE DYSFUNCTION 08/29/2008  . CERUMEN IMPACTION, BILATERAL 06/27/2008  . GOUT 09/21/2007  . HYPERLIPIDEMIA 10/12/2006  . OBESITY NOS 10/12/2006  . HYPERTENSION 10/12/2006  . GERD 10/12/2006  . HIATAL HERNIA 10/12/2006    Past Surgical History:  Procedure Laterality Date  . COLONOSCOPY N/A 02/27/2014   Procedure: COLONOSCOPY;  Surgeon: Rogene Houston, MD;  Location: [facility];  Service: Endoscopy;  Laterality: N/A;  830  . NO PAST SURGERIES         Home Medications    Prior to Admission medications   Medication Sig Start Date End Date Taking? Authorizing Provider  allopurinol (ZYLOPRIM) 300 MG tablet Take 300 mg by mouth daily.     Yes [provider]  gabapentin (NEURONTIN) 300 MG capsule Take 300 mg by mouth daily.   Yes [provider]  lisinopril-hydrochlorothiazide (PRINZIDE,ZESTORETIC) 20-12.5 MG per tablet Take 1 tablet by mouth daily.     Yes [provider]  pravastatin (PRAVACHOL) 40 MG tablet Take 40 mg by mouth daily.    Yes [provider]  ibuprofen (ADVIL,MOTRIN) 600 MG tablet Take 1 tablet (600 mg total) by mouth 3 (three) times daily after meals.  07/01/17   Julianne Rice, MD    Family History Family History  Problem Relation Age of Onset  . Diabetes Unknown   . Colon cancer Neg Hx     Social History Social History  Substance Use Topics  . Smoking status: Former Smoker    Packs/day: 0.25    Years: 1.00    Types: Cigarettes  . Smokeless tobacco: Never Used  . Alcohol use No     Allergies   Patient has no known allergies.   Review of Systems Review of Systems  Constitutional: Negative for chills, fatigue and fever.  HENT: Negative for facial swelling and trouble swallowing.   Eyes: Negative for visual disturbance.  Respiratory: Negative for cough, shortness of breath and wheezing.   Cardiovascular: Negative for chest pain, palpitations and leg swelling.  Gastrointestinal: Negative for abdominal pain, diarrhea, nausea and vomiting.  Musculoskeletal: Negative for back pain, myalgias, neck pain and neck stiffness.  Skin: Negative for rash and wound.  Neurological: Positive for weakness and numbness. Negative for dizziness, syncope, speech difficulty, light-headedness and headaches.  All other systems reviewed and are negative.    Physical Exam Updated Vital Signs BP 136/89 (BP Location: Right Arm)   Pulse (!) 107   Temp 99.1 F (37.3 C) (Oral)   Resp 16   Ht 5\' 6"  (1.676 m)   Wt 135.6 kg (299 lb)   SpO2 96%  BMI 48.26 kg/m   Physical Exam  Constitutional: He is oriented to person, place, and time. He appears well-developed and well-nourished. No distress.  HENT:  Head: Normocephalic and atraumatic.  Mouth/Throat: Oropharynx is clear and moist. No oropharyngeal exudate.  Cranial nerves II through XII grossly intact.  Eyes: Pupils are equal, round, and reactive to light. EOM are normal.  Neck: Normal range of motion. Neck supple.  No posterior midline cervical tenderness to palpation.  Cardiovascular: Normal rate and regular rhythm.  Exam reveals no gallop and no friction rub.   No murmur  heard. Pulmonary/Chest: Effort normal and breath sounds normal. No respiratory distress. He has no wheezes. He has no rales. He exhibits no tenderness.  Abdominal: Soft. Bowel sounds are normal. There is no tenderness. There is no rebound and no guarding.  Musculoskeletal: Normal range of motion. He exhibits no edema or tenderness.  Positive Tinel sign with percussion over the ulnar nerve at the wrist and at the elbow. 2+ distal pulses in all extremities. Patient with some pain with range of motion of the left wrist but no erythema, warmth or effusion. No midline thoracic or lumbar tenderness.  Lymphadenopathy:    He has no cervical adenopathy.  Neurological: He is alert and oriented to person, place, and time.  Patient with pins and needle sensation to the volar surface of the left hand. Good intrinsic muscle strength.Good bilateral grip strength. Proximal muscles are 5/5.  Skin: Skin is warm and dry. Capillary refill takes less than 2 seconds. No rash noted. He is not diaphoretic. No erythema.  Psychiatric: He has a normal mood and affect. His behavior is normal.  Nursing note and vitals reviewed.    ED Treatments / Results  Labs (all labs ordered are listed, but only abnormal results are displayed) Labs Reviewed  CBC WITH DIFFERENTIAL/PLATELET - Abnormal; Notable for the following:       Result Value   RBC 4.11 (*)    Hemoglobin 12.6 (*)    HCT 38.7 (*)    All other components within normal limits  BASIC METABOLIC PANEL - Abnormal; Notable for the following:    Potassium 3.4 (*)    Glucose, Bld 147 (*)    All other components within normal limits  MAGNESIUM    EKG  EKG Interpretation None       Radiology No results found.  Procedures Procedures (including critical care time)  Medications Ordered in ED Medications - No data to display   Initial Impression / Assessment and Plan / ED Course  I have reviewed the triage vital signs and the nursing notes.  Pertinent  labs & imaging results that were available during my care of the patient were reviewed by me and considered in my medical decision making (see chart for details).     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 Will check basic electrolytes. Low suspicion for CVA. Placed wrist splint and will start on NSAIDs. Advised to follow-up with hand surgery Return precautions given. Final Clinical Impressions(s) / ED Diagnoses   Final diagnoses:  Ulnar neuropathy at wrist, left    New Prescriptions New Prescriptions   IBUPROFEN (ADVIL,MOTRIN) 600 MG TABLET    Take 1 tablet (600 mg total) by mouth 3 (three) times daily after meals.     Julianne Rice, MD 07/01/17 Despina Pole

## 2017-07-01 NOTE — ED Notes (Signed)
Dr Darreld Mclean in to assess  As well as pharm

## 2017-07-31 ENCOUNTER — Emergency Department (HOSPITAL_COMMUNITY)
Admission: EM | Admit: 2017-07-31 | Discharge: 2017-07-31 | Disposition: A | Discharge status: Home / Self Care | Payer: 59 | Attending: Emergency Medicine | Admitting: Emergency Medicine

## 2017-07-31 ENCOUNTER — Encounter (HOSPITAL_COMMUNITY): Payer: Self-pay | Admitting: Emergency Medicine

## 2017-07-31 DIAGNOSIS — R202 Paresthesia of skin: Secondary | ICD-10-CM | POA: Diagnosis not present

## 2017-07-31 DIAGNOSIS — R200 Anesthesia of skin: Secondary | ICD-10-CM | POA: Diagnosis present

## 2017-07-31 DIAGNOSIS — G5602 Carpal tunnel syndrome, left upper limb: Secondary | ICD-10-CM | POA: Insufficient documentation

## 2017-07-31 DIAGNOSIS — Z87891 Personal history of nicotine dependence: Secondary | ICD-10-CM | POA: Insufficient documentation

## 2017-07-31 DIAGNOSIS — I10 Essential (primary) hypertension: Secondary | ICD-10-CM | POA: Insufficient documentation

## 2017-07-31 MED ORDER — IBUPROFEN 800 MG PO TABS: 800.0000 mg | ORAL_TABLET | Freq: Three times a day (TID) | ORAL | 0 refills | Status: DC

## 2017-07-31 NOTE — ED Notes (Signed)
Patient reports of decreased grip, numbness and tingling in left hand. States tingling radiates into left elbow. No other deficits or pain noted.

## 2017-07-31 NOTE — ED Triage Notes (Signed)
Pt c/o left arm numbness and tingling upon waking x 1 week. Pt denies injury.

## 2017-07-31 NOTE — ED Provider Notes (Signed)
Emergency Department Provider Note   I have reviewed the triage vital signs and the nursing notes.   HISTORY  Chief Complaint Numbness   HPI Roberto Guerra is a 56 y.o. male past medical history of gout, hyperlipidemia and hypertension presents the emergency department with intermittent left hand paresthesias. 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 He states it as a sharp pins and needles tingling feeling from his thumb, pointer finger and middle finger that radiates up his arm.states it's better with flexion and worse with over extension. There is no numbness or weakness anywhere else. This is been going on intermittently for a while but over the last month has been more prevalent.he has no chest pain, short of breath, fever, recent illnesses or other associated or modifying symptoms.   Past Medical History:  Diagnosis Date  . Gout   . Hyperlipidemia   . Hypertension     Patient Active Problem List   Diagnosis Date Noted  . Neuralgia of lower extremity 08/15/2012  . ERECTILE DYSFUNCTION 08/29/2008  . CERUMEN IMPACTION, BILATERAL 06/27/2008  . GOUT 09/21/2007  . HYPERLIPIDEMIA 10/12/2006  . OBESITY NOS 10/12/2006  . HYPERTENSION 10/12/2006  . GERD 10/12/2006  . HIATAL HERNIA 10/12/2006    Past Surgical History:  Procedure Laterality Date  . COLONOSCOPY N/A 02/27/2014   Procedure: COLONOSCOPY;  Surgeon: Rogene Houston, MD;  Location: [facility];  Service: Endoscopy;  Laterality: N/A;  830  . NO PAST SURGERIES      Current Outpatient Rx  . Order #: [id number] Class: Historical Med  . Order #: [id number] Class: Historical Med  . Order #: [id number] Class: Print  . Order #: [id number] Class: Historical Med  . Order #: [id number] Class: Historical Med    Allergies Patient has no known allergies.  Family History  Problem Relation Age of Onset  . Diabetes Unknown   . Colon cancer Neg Hx      Social History Social History  Substance Use Topics  . Smoking status: Former Smoker    Packs/day: 0.25    Years: 1.00    Types: Cigarettes  . Smokeless tobacco: Never Used  . Alcohol use No    Review of Systems  All other systems negative except as documented in the HPI. All pertinent positives and negatives as reviewed in the HPI. ____________________________________________   PHYSICAL EXAM:  VITAL SIGNS: ED Triage Vitals  Enc Vitals Group     BP 07/31/17 0717 (!) 150/77     Pulse Rate 07/31/17 0717 87     Resp 07/31/17 0717 18     Temp 07/31/17 0717 98.3 F (36.8 C)     Temp Source 07/31/17 0717 Oral     SpO2 07/31/17 0717 98 %     Weight 07/31/17 0713 294 lb (133.4 kg)     Height 07/31/17 0713 5\' 6"  (1.676 m)     Head Circumference --      Peak Flow --      Pain Score --      Pain Loc --      Pain Edu? --      Excl. in [place]? --     Constitutional: Alert and oriented. Well appearing and in no acute distress. Eyes: Conjunctivae are normal. PERRL. EOMI. Head: Atraumatic. Nose: No congestion/rhinnorhea. Mouth/Throat: Mucous membranes are moist.  Oropharynx non-erythematous. Neck: No stridor.  No meningeal signs.   Cardiovascular: Normal rate, regular  rhythm. Good peripheral circulation. Grossly normal heart sounds.   Respiratory: Normal respiratory effort.  No retractions. Lungs CTAB. Gastrointestinal: Soft and nontender. No distention.  Musculoskeletal: No lower extremity tenderness nor edema. No gross deformities of extremities. Neurologic:  Normal speech and language. No gross focal neurologic deficits are appreciated. Symptoms recreated with palpation over flexor surface wrist crease on left hand. Improved without pressure. Also worse with over extension and better with flexion Skin:  Skin is warm, dry and intact. No rash noted.   ____________________________________________   PROCEDURES  Procedure(s) performed:    Procedures   ____________________________________________   INITIAL IMPRESSION / ASSESSMENT AND PLAN / ED COURSE  Pertinent labs & imaging results that were available during my care of the patient were reviewed by me and considered in my medical decision making (see chart for details).  Low suspicion for systemic etiology seems like a peripheral nerve palsy. More consistent with carpal tunnel. We'll give a splint and follow-up with neurology and hand surgery.  ____________________________________________  FINAL CLINICAL IMPRESSION(S) / ED DIAGNOSES  Final diagnoses:  Carpal tunnel syndrome of left wrist  Paresthesia     MEDICATIONS GIVEN DURING THIS VISIT:  Medications - No data to display   NEW OUTPATIENT MEDICATIONS STARTED DURING THIS VISIT:  Discharge Medication List as of 07/31/2017  8:02 AM      Note:  This document was prepared using Dragon voice recognition software and may include unintentional dictation errors.   Alexea Blase, Corene Cornea, MD 08/01/17 [phone]

## 2018-10-22 NOTE — Patient Instructions (Signed)
Your procedure is scheduled on: 10/29/2018  Report to [facility] at  645   AM.  Call this number if you have problems the morning of surgery: [phone]   Do not eat food or drink liquids :After Midnight.      Take these medicines the morning of surgery with A SIP OF WATER: allopurinol, gabapentin, lisinopril.   Do not wear jewelry, make-up or nail polish.  Do not wear lotions, powders, or perfumes. You may wear deodorant.  Do not shave 48 hours prior to surgery.  Do not bring valuables to the hospital.  Contacts, dentures or bridgework may not be worn into surgery.  Leave suitcase in the car. After surgery it may be brought to your room.  For patients admitted to the hospital, checkout time is 11:00 AM the day of discharge.   Patients discharged the day of surgery will not be allowed to drive home.  :     Please read over the following fact sheets that you were given: Coughing and Deep Breathing, Surgical Site Infection Prevention, Anesthesia Post-op Instructions and Care and Recovery After Surgery    Cataract A cataract is a clouding of the lens of the eye. When a lens becomes cloudy, vision is reduced based on the degree and nature of the clouding. Many cataracts reduce vision to some degree. Some cataracts make people more near-sighted as they develop. Other cataracts increase glare. Cataracts that are ignored and become worse can sometimes look white. The white color can be seen through the pupil. CAUSES   Aging. However, cataracts may occur at any age, even in newborns.   Certain drugs.   Trauma to the eye.   Certain diseases such as diabetes.   Specific eye diseases such as chronic inflammation inside the eye or a sudden attack of a rare form of glaucoma.   Inherited or acquired medical problems.  SYMPTOMS   Gradual, progressive drop in vision in the affected eye.   Severe, rapid visual loss. This most often happens when trauma is the cause.  DIAGNOSIS  To detect a  cataract, an eye doctor examines the lens. Cataracts are best diagnosed with an exam of the eyes with the pupils enlarged (dilated) by drops.  TREATMENT  For an early cataract, vision may improve by using different eyeglasses or stronger lighting. If that does not help your vision, surgery is the only effective treatment. A cataract needs to be surgically removed when vision loss interferes with your everyday activities, such as driving, reading, or watching TV. A cataract may also have to be removed if it prevents examination or treatment of another eye problem. Surgery removes the cloudy lens and usually replaces it with a substitute lens (intraocular lens, IOL).  At a time when both you and your doctor agree, the cataract will be surgically removed. If you have cataracts in both eyes, only one is usually removed at a time. This allows the operated eye to heal and be out of danger from any possible problems after surgery (such as infection or poor wound healing). In rare cases, a cataract may be doing damage to your eye. In these cases, your caregiver may advise surgical removal right away. The vast majority of people who have cataract surgery have better vision afterward. HOME CARE INSTRUCTIONS  If you are not planning surgery, you may be asked to do the following:  Use different eyeglasses.   Use stronger or brighter lighting.   Ask your eye doctor about reducing your  medicine dose or changing medicines if it is thought that a medicine caused your cataract. Changing medicines does not make the cataract go away on its own.   Become familiar with your surroundings. Poor vision can lead to injury. Avoid bumping into things on the affected side. You are at a higher risk for tripping or falling.   Exercise extreme care when driving or operating machinery.   Wear sunglasses if you are sensitive to bright light or experiencing problems with glare.  SEEK IMMEDIATE MEDICAL CARE IF:   You have a  worsening or sudden vision loss.   You notice redness, swelling, or increasing pain in the eye.   You have a fever.  Document Released: 10/24/2005 Document Revised: 10/13/2011 Document Reviewed: 06/17/2011 [facility] Patient Information 2012 [place].PATIENT INSTRUCTIONS POST-ANESTHESIA  IMMEDIATELY FOLLOWING SURGERY:  Do not drive or operate machinery for the first twenty four hours after surgery.  Do not make any important decisions for twenty four hours after surgery or while taking narcotic pain medications or sedatives.  If you develop intractable nausea and vomiting or a severe headache please notify your doctor immediately.  FOLLOW-UP:  Please make an appointment with your surgeon as instructed. You do not need to follow up with anesthesia unless specifically instructed to do so.  WOUND CARE INSTRUCTIONS (if applicable):  Keep a dry clean dressing on the anesthesia/puncture wound site if there is drainage.  Once the wound has quit draining you may leave it open to air.  Generally you should leave the bandage intact for twenty four hours unless there is drainage.  If the epidural site drains for more than 36-48 hours please call the anesthesia department.  QUESTIONS?:  Please feel free to call your physician or the hospital operator if you have any questions, and they will be happy to assist you.

## 2018-10-25 ENCOUNTER — Encounter (HOSPITAL_COMMUNITY)
Admission: RE | Admit: 2018-10-25 | Discharge: 2018-10-25 | Disposition: A | Discharge status: Home / Self Care | Payer: Managed Care, Other (non HMO) | Source: Ambulatory Visit | Attending: Ophthalmology | Admitting: Ophthalmology

## 2018-10-25 ENCOUNTER — Encounter (HOSPITAL_COMMUNITY): Payer: Self-pay

## 2018-10-25 ENCOUNTER — Other Ambulatory Visit: Payer: Self-pay

## 2018-10-25 DIAGNOSIS — Z01818 Encounter for other preprocedural examination: Secondary | ICD-10-CM | POA: Insufficient documentation

## 2018-10-25 LAB — BASIC METABOLIC PANEL
Anion gap: 9 (ref 5–15)
BUN: 15 mg/dL (ref 6–20)
CO2: 26 mmol/L (ref 22–32)
Calcium: 9.3 mg/dL (ref 8.9–10.3)
Chloride: 103 mmol/L (ref 98–111)
Creatinine, Ser: 1.02 mg/dL (ref 0.61–1.24)
GFR calc Af Amer: >60 mL/min (ref >60)
GFR calc non Af Amer: >60 mL/min (ref >60)
Glucose, Bld: 121 mg/dL — ABNORMAL HIGH (ref 70–99)
Potassium: 3.5 mmol/L (ref 3.5–5.1)
Sodium: 138 mmol/L (ref 135–145)

## 2018-10-25 LAB — GLUCOSE, CAPILLARY: Glucose-Capillary: 116 mg/dL — ABNORMAL HIGH (ref 70–99)

## 2018-10-25 LAB — HEMOGLOBIN A1C
Hgb A1c MFr Bld: 6.6 % — ABNORMAL HIGH (ref 4.8–5.6)
Mean Plasma Glucose: 142.72 mg/dL

## 2018-10-26 NOTE — Pre-Procedure Instructions (Signed)
HgBA1C routed to PCP.

## 2018-10-29 ENCOUNTER — Ambulatory Visit (HOSPITAL_COMMUNITY): Payer: Managed Care, Other (non HMO) | Admitting: Anesthesiology

## 2018-10-29 ENCOUNTER — Encounter (HOSPITAL_COMMUNITY): Payer: Self-pay | Admitting: *Deleted

## 2018-10-29 ENCOUNTER — Ambulatory Visit (HOSPITAL_COMMUNITY)
Admission: RE | Admit: 2018-10-29 | Discharge: 2018-10-29 | Disposition: A | Discharge status: Home / Self Care | Payer: Managed Care, Other (non HMO) | Source: Ambulatory Visit | Attending: Ophthalmology | Admitting: Ophthalmology

## 2018-10-29 ENCOUNTER — Encounter (HOSPITAL_COMMUNITY)
Admission: RE | Disposition: A | Discharge status: Home / Self Care | Payer: Self-pay | Source: Ambulatory Visit | Attending: Ophthalmology

## 2018-10-29 DIAGNOSIS — I10 Essential (primary) hypertension: Secondary | ICD-10-CM | POA: Insufficient documentation

## 2018-10-29 DIAGNOSIS — H2522 Age-related cataract, morgagnian type, left eye: Secondary | ICD-10-CM | POA: Diagnosis present

## 2018-10-29 DIAGNOSIS — Z79899 Other long term (current) drug therapy: Secondary | ICD-10-CM | POA: Diagnosis not present

## 2018-10-29 DIAGNOSIS — Z87891 Personal history of nicotine dependence: Secondary | ICD-10-CM | POA: Diagnosis not present

## 2018-10-29 HISTORY — PX: CATARACT EXTRACTION W/PHACO: SHX586

## 2018-10-29 LAB — GLUCOSE, CAPILLARY: Glucose-Capillary: 109 mg/dL — ABNORMAL HIGH (ref 70–99)

## 2018-10-29 SURGERY — PHACOEMULSIFICATION, CATARACT, WITH IOL INSERTION
Anesthesia: Monitor Anesthesia Care | Site: Eye | Laterality: Left

## 2018-10-29 MED ORDER — LIDOCAINE HCL 3.5 % OP GEL
1.0000 "application " | Freq: Once | OPHTHALMIC | Status: AC
  Administered 2018-10-29: 1 via OPHTHALMIC

## 2018-10-29 MED ORDER — MIDAZOLAM HCL 5 MG/5ML IJ SOLN
INTRAMUSCULAR | Status: DC | PRN
  Administered 2018-10-29: 2 mg via INTRAVENOUS

## 2018-10-29 MED ORDER — HYDROMORPHONE HCL 1 MG/ML IJ SOLN: 0.2500 mg | INTRAMUSCULAR | Status: DC | PRN

## 2018-10-29 MED ORDER — HYDROCODONE-ACETAMINOPHEN 7.5-325 MG PO TABS: 1.0000 | ORAL_TABLET | Freq: Once | ORAL | Status: DC | PRN

## 2018-10-29 MED ORDER — LIDOCAINE HCL (PF) 1 % IJ SOLN
INTRAOCULAR | Status: DC | PRN
  Administered 2018-10-29: 1 mL via OPHTHALMIC

## 2018-10-29 MED ORDER — PHENYLEPHRINE HCL 2.5 % OP SOLN
1.0000 [drp] | OPHTHALMIC | Status: AC
  Administered 2018-10-29 (×3): 1 [drp] via OPHTHALMIC

## 2018-10-29 MED ORDER — CYCLOPENTOLATE-PHENYLEPHRINE 0.2-1 % OP SOLN
1.0000 [drp] | OPHTHALMIC | Status: AC
  Administered 2018-10-29 (×3): 1 [drp] via OPHTHALMIC

## 2018-10-29 MED ORDER — EPINEPHRINE PF 1 MG/ML IJ SOLN
INTRAOCULAR | Status: DC | PRN
  Administered 2018-10-29: 500 mL

## 2018-10-29 MED ORDER — NA HYALUR & NA CHOND-NA HYALUR 0.55-0.5 ML IO KIT
PACK | INTRAOCULAR | Status: DC | PRN
  Administered 2018-10-29: 1 via OPHTHALMIC

## 2018-10-29 MED ORDER — MIDAZOLAM HCL 2 MG/2ML IJ SOLN
INTRAMUSCULAR | Status: AC
  Filled 2018-10-29: qty 2

## 2018-10-29 MED ORDER — TRYPAN BLUE 0.06 % OP SOLN
OPHTHALMIC | Status: DC | PRN
  Administered 2018-10-29: 0.5 mL via INTRAOCULAR

## 2018-10-29 MED ORDER — PROMETHAZINE HCL 25 MG/ML IJ SOLN: 6.2500 mg | INTRAMUSCULAR | Status: DC | PRN

## 2018-10-29 MED ORDER — TRYPAN BLUE 0.06 % OP SOLN
OPHTHALMIC | Status: AC
  Filled 2018-10-29: qty 0.5

## 2018-10-29 MED ORDER — BSS IO SOLN
INTRAOCULAR | Status: DC | PRN
  Administered 2018-10-29 (×2): 15 mL via INTRAOCULAR

## 2018-10-29 MED ORDER — LACTATED RINGERS IV SOLN
INTRAVENOUS | Status: DC | PRN
  Administered 2018-10-29: 07:00:00 via INTRAVENOUS

## 2018-10-29 MED ORDER — TETRACAINE HCL 0.5 % OP SOLN
1.0000 [drp] | OPHTHALMIC | Status: AC
  Administered 2018-10-29 (×3): 1 [drp] via OPHTHALMIC

## 2018-10-29 MED ORDER — POVIDONE-IODINE 5 % OP SOLN
OPHTHALMIC | Status: DC | PRN
  Administered 2018-10-29: 1 via OPHTHALMIC

## 2018-10-29 MED ORDER — MIDAZOLAM HCL 2 MG/2ML IJ SOLN: 0.5000 mg | Freq: Once | INTRAMUSCULAR | Status: DC | PRN

## 2018-10-29 MED ORDER — NEOMYCIN-POLYMYXIN-DEXAMETH 3.5-10000-0.1 OP SUSP
OPHTHALMIC | Status: DC | PRN
  Administered 2018-10-29: 2 [drp] via OPHTHALMIC

## 2018-10-29 SURGICAL SUPPLY — 13 items
CLOTH BEACON ORANGE TIMEOUT ST (SAFETY) ×2 IMPLANT
EYE SHIELD UNIVERSAL CLEAR (GAUZE/BANDAGES/DRESSINGS) ×2 IMPLANT
GLOVE BIOGEL PI IND STRL 7.0 (GLOVE) IMPLANT
GLOVE BIOGEL PI INDICATOR 7.0 (GLOVE) ×4
LENS ALC ACRYL/TECN (Ophthalmic Related) ×2 IMPLANT
NDL HYPO 18GX1.5 BLUNT FILL (NEEDLE) IMPLANT
NEEDLE HYPO 18GX1.5 BLUNT FILL (NEEDLE) ×3 IMPLANT
PAD ARMBOARD 7.5X6 YLW CONV (MISCELLANEOUS) ×2 IMPLANT
SYRINGE LUER LOK 1CC (MISCELLANEOUS) ×2 IMPLANT
TAPE SURG TRANSPORE 1 IN (GAUZE/BANDAGES/DRESSINGS) IMPLANT
TAPE SURGICAL TRANSPORE 1 IN (GAUZE/BANDAGES/DRESSINGS) ×2
VISCOELASTIC ADDITIONAL (OPHTHALMIC RELATED) ×2 IMPLANT
WATER STERILE IRR 250ML POUR (IV SOLUTION) ×2 IMPLANT

## 2018-10-29 NOTE — Op Note (Signed)
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  Assistants: None  Anesthesia: Topical with MAC  Indications: Painless, progressive loss of vision with compromise of daily activities.  Surgery: Cataract Extraction with Intraocular lens Implant Left Eye  Discription: The patient had dilating drops and viscous lidocaine placed into the Left eye in the pre-op holding area. After transfer to the operating room, a time out was performed. The patient was then prepped and draped. Beginning with a 61 degree blade a paracentesis port was made at the surgeon's 2 o'clock position. The anterior chamber was then filled with 1% non-preserved lidocaine. The anterior chamber was then filled with Vision Blue to stain the anterior capsule. The Vision Blue was displaced from the anterior chamber with BSS. This was followed by filling the anterior chamber with Viscoat. A 2.33m keratome blade was used to make a clear corneal incision at the temporal limbus. A bent cystatome needle was used to create a continuous tear capsulotomy. Hydrodissection was performed with balanced salt solution on a Fine canula. The lens nucleus was then removed using the phacoemulsification handpiece. The red reflex was dull likely due to some of the vision blue seeping into the vitreous. Residual cortex was removed with the I&A handpiece. The anterior chamber and capsular bag were refilled with Provisc. A posterior chamber intraocular lens was placed into the capsular bag with it's injector. The implant was positioned with the Kuglan hook. The Provisc was then removed from the anterior chamber and capsular bag with the I&A handpiece. Stromal hydration of the main incision and paracentesis port was performed with BSS on a Fine canula. The wounds were tested for leak which was negative. The patient tolerated the  procedure well. There were no operative complications. The patient was then transferred to the recovery room in stable condition.  Complications: None  Specimen: None  EBL: None  Prosthetic device: J[employer]J TechnisOne, PCB00, power 22.0D, SN [id number]

## 2018-10-29 NOTE — Anesthesia Postprocedure Evaluation (Signed)
Anesthesia Post Note  Patient: Roberto Guerra  Procedure(s) Performed: CATARACT EXTRACTION PHACO AND INTRAOCULAR LENS PLACEMENT (IOC) (Left Eye)  Patient location during evaluation: PACU Anesthesia Type: MAC Level of consciousness: awake and alert and oriented Pain management: pain level controlled Vital Signs Assessment: post-procedure vital signs reviewed and stable Respiratory status: spontaneous breathing Cardiovascular status: stable and blood pressure returned to baseline Postop Assessment: no apparent nausea or vomiting Anesthetic complications: no     Last Vitals:  Vitals:   10/29/18 0720  BP: 129/85  Pulse: 88  Resp: 18  Temp: 36.7 C  SpO2: 94%    Last Pain:  Vitals:   10/29/18 0720  TempSrc: Oral  PainSc: 3                  [name]

## 2018-10-29 NOTE — Discharge Instructions (Signed)
Monitored Anesthesia Care, Care After  These instructions provide you with information about caring for yourself after your procedure. Your health care provider may also give you more specific instructions. Your treatment has been planned according to current medical practices, but problems sometimes occur. Call your health care provider if you have any problems or questions after your procedure.  What can I expect after the procedure?  After your procedure, you may:  Feel sleepy for several hours.  Feel clumsy and have poor balance for several hours.  Feel forgetful about what happened after the procedure.  Have poor judgment for several hours.  Feel nauseous or vomit.  Have a sore throat if you had a breathing tube during the procedure.  Follow these instructions at home:  For at least 24 hours after the procedure:         Have a responsible adult stay with you. It is important to have someone help care for you until you are awake and alert.  Rest as needed.  Do not:  Participate in activities in which you could fall or become injured.  Drive.  Use heavy machinery.  Drink alcohol.  Take sleeping pills or medicines that cause drowsiness.  Make important decisions or sign legal documents.  Take care of children on your own.  Eating and drinking  Follow the diet that is recommended by your health care provider.  If you vomit, drink water, juice, or soup when you can drink without vomiting.  Make sure you have little or no nausea before eating solid foods.  General instructions  Take over-the-counter and prescription medicines only as told by your health care provider.  If you have sleep apnea, surgery and certain medicines can increase your risk for breathing problems. Follow instructions from your health care provider about wearing your sleep device:  Anytime you are sleeping, including during daytime naps.  While taking prescription pain medicines, sleeping medicines, or medicines that make you drowsy.  If you  smoke, do not smoke without supervision.  Keep all follow-up visits as told by your health care provider. This is important.  Contact a health care provider if:  You keep feeling nauseous or you keep vomiting.  You feel light-headed.  You develop a rash.  You have a fever.  Get help right away if:  You have trouble breathing.  Summary  For several hours after your procedure, you may feel sleepy and have poor judgment.  Have a responsible adult stay with you for at least 24 hours or until you are awake and alert.  This information is not intended to replace advice given to you by your health care provider. Make sure you discuss any questions you have with your health care provider.  Document Released: 02/14/2016 Document Revised: 06/09/2017 Document Reviewed: 02/14/2016  Elsevier Interactive Patient Education  2019 [employer]

## 2018-10-29 NOTE — Anesthesia Preprocedure Evaluation (Signed)
Anesthesia Evaluation  Patient identified by MRN, date of birth, ID band Patient awake    Reviewed: Allergy & Precautions, NPO status , Patient's Chart, lab work & pertinent test results  Airway Mallampati: II  TM Distance: >3 FB Neck ROM: Full    Dental no notable dental hx. (+) Teeth Intact   Pulmonary neg pulmonary ROS, former smoker,    Pulmonary exam normal breath sounds clear to auscultation       Cardiovascular Exercise Tolerance: Good hypertension, negative cardio ROS Normal cardiovascular examI Rhythm:Regular Rate:Normal     Neuro/Psych  Neuromuscular disease negative psych ROS   GI/Hepatic Neg liver ROS, neg GERD  ,Denied GERD when questioned    Endo/Other  Morbid obesity  Renal/GU negative Renal ROS  negative genitourinary   Musculoskeletal negative musculoskeletal ROS (+)   Abdominal   Peds negative pediatric ROS (+)  Hematology negative hematology ROS (+)   Anesthesia Other Findings   Reproductive/Obstetrics negative OB ROS                             Anesthesia Physical Anesthesia Plan  ASA: III  Anesthesia Plan: MAC   Post-op Pain Management:    Induction: Intravenous  PONV Risk Score and Plan:   Airway Management Planned: Nasal Cannula and Simple Face Mask  Additional Equipment:   Intra-op Plan:   Post-operative Plan:   Informed Consent: I have reviewed the patients History and Physical, chart, labs and discussed the procedure including the risks, benefits and alternatives for the proposed anesthesia with the patient or authorized representative who has indicated his/her understanding and acceptance.   Dental advisory given  Plan Discussed with: CRNA  Anesthesia Plan Comments:         Anesthesia Quick Evaluation

## 2018-10-29 NOTE — Transfer of Care (Signed)
Immediate Anesthesia Transfer of Care Note  Patient: Roberto Guerra  Procedure(s) Performed: CATARACT EXTRACTION PHACO AND INTRAOCULAR LENS PLACEMENT (IOC) (Left Eye)  Patient Location: PACU  Anesthesia Type:MAC  Level of Consciousness: awake  Airway & Oxygen Therapy: Patient Spontanous Breathing  Post-op Assessment: Report given to RN  Post vital signs: Reviewed and stable  Last Vitals:  Vitals Value Taken Time  BP    Temp    Pulse    Resp    SpO2      Last Pain:  Vitals:   10/29/18 0720  TempSrc: Oral  PainSc: 3       Patients Stated Pain Goal: 4 (01/56/15 3794)  Complications: No apparent anesthesia complications

## 2018-10-29 NOTE — H&P (Signed)
I have reviewed the H&P, the patient was re-examined, and I have identified no interval changes in medical condition and plan of care since the history and physical of record  

## 2018-10-30 ENCOUNTER — Encounter (HOSPITAL_COMMUNITY): Payer: Self-pay | Admitting: Ophthalmology

## 2018-11-18 ENCOUNTER — Encounter (HOSPITAL_COMMUNITY): Payer: Self-pay | Admitting: *Deleted

## 2018-11-18 ENCOUNTER — Other Ambulatory Visit: Payer: Self-pay

## 2018-11-18 ENCOUNTER — Emergency Department (HOSPITAL_COMMUNITY)
Admission: EM | Admit: 2018-11-18 | Discharge: 2018-11-18 | Disposition: A | Discharge status: Home / Self Care | Payer: Managed Care, Other (non HMO) | Attending: Emergency Medicine | Admitting: Emergency Medicine

## 2018-11-18 DIAGNOSIS — M545 Low back pain: Secondary | ICD-10-CM | POA: Diagnosis present

## 2018-11-18 DIAGNOSIS — M5417 Radiculopathy, lumbosacral region: Secondary | ICD-10-CM | POA: Diagnosis not present

## 2018-11-18 DIAGNOSIS — Z87891 Personal history of nicotine dependence: Secondary | ICD-10-CM | POA: Insufficient documentation

## 2018-11-18 DIAGNOSIS — Z7902 Long term (current) use of antithrombotics/antiplatelets: Secondary | ICD-10-CM | POA: Diagnosis not present

## 2018-11-18 DIAGNOSIS — E119 Type 2 diabetes mellitus without complications: Secondary | ICD-10-CM | POA: Diagnosis not present

## 2018-11-18 DIAGNOSIS — Z79899 Other long term (current) drug therapy: Secondary | ICD-10-CM | POA: Diagnosis not present

## 2018-11-18 DIAGNOSIS — Z7984 Long term (current) use of oral hypoglycemic drugs: Secondary | ICD-10-CM | POA: Diagnosis not present

## 2018-11-18 DIAGNOSIS — I10 Essential (primary) hypertension: Secondary | ICD-10-CM | POA: Insufficient documentation

## 2018-11-18 HISTORY — DX: Type 2 diabetes mellitus without complications: E11.9

## 2018-11-18 MED ORDER — KETOROLAC TROMETHAMINE 60 MG/2ML IM SOLN
60.0000 mg | Freq: Once | INTRAMUSCULAR | Status: AC
  Administered 2018-11-18: 60 mg via INTRAMUSCULAR
  Filled 2018-11-18: qty 2

## 2018-11-18 MED ORDER — PREDNISONE 50 MG PO TABS: ORAL_TABLET | ORAL | 0 refills | Status: DC

## 2018-11-18 NOTE — ED Triage Notes (Signed)
Pt c/o right side flank/back pain that radiates down right leg to knee area, pain is worse with movement, denies any injury, state that he was seen by pcp three weeks ago for same, was given a shot but has not gotten any better

## 2018-11-18 NOTE — ED Provider Notes (Signed)
[facility] EMERGENCY DEPARTMENT Provider Note   CSN: [id number] Arrival date & time: 11/18/18  0503     History   Chief Complaint Chief Complaint  Patient presents with  . Back Pain    HPI Roberto Guerra is a 58 y.o. male.  The history is provided by the patient.  Back Pain  Quality:  Aching Radiates to:  R posterior upper leg Pain severity:  Severe Onset quality:  Gradual Timing:  Constant Progression:  Worsening Chronicity:  New Relieved by:  Nothing Worsened by:  Movement Associated symptoms: no abdominal pain, no bladder incontinence, no bowel incontinence, no fever, no numbness and no weakness   Patient reports he has been having right lower back pain that rates to his thigh for over a week.  No trauma.  No weakness.  No incontinence.  He is able to walk.  Seen by his PCP and given a "shot "but is unclear what medications this was. No urinary symptoms.   Past Medical History:  Diagnosis Date  . Diabetes mellitus without complication ([place])   . Gout   . Hyperlipidemia   . Hypertension     Patient Active Problem List   Diagnosis Date Noted  . Neuralgia of lower extremity 08/15/2012  . ERECTILE DYSFUNCTION 08/29/2008  . CERUMEN IMPACTION, BILATERAL 06/27/2008  . GOUT 09/21/2007  . HYPERLIPIDEMIA 10/12/2006  . OBESITY NOS 10/12/2006  . HYPERTENSION 10/12/2006  . GERD 10/12/2006  . HIATAL HERNIA 10/12/2006    Past Surgical History:  Procedure Laterality Date  . CATARACT EXTRACTION W/PHACO Left 10/29/2018   Procedure: CATARACT EXTRACTION PHACO AND INTRAOCULAR LENS PLACEMENT (IOC);  Surgeon: Tonny Branch, MD;  Location: [facility] ORS;  Service: Ophthalmology;  Laterality: Left;  CDE: 43.37  . COLONOSCOPY N/A 02/27/2014   Procedure: COLONOSCOPY;  Surgeon: Rogene Houston, MD;  Location: [facility] ENDO SUITE;  Service: Endoscopy;  Laterality: N/A;  830  . NO PAST SURGERIES          Home Medications    Prior to Admission medications   Medication Sig Start Date End  Date Taking? Authorizing Provider  allopurinol (ZYLOPRIM) 300 MG tablet Take 300 mg by mouth daily.     Yes [provider]  gabapentin (NEURONTIN) 300 MG capsule Take 300 mg by mouth daily.   Yes [provider]  lisinopril-hydrochlorothiazide (PRINZIDE,ZESTORETIC) 20-12.5 MG per tablet Take 1 tablet by mouth daily.     Yes [provider]  meloxicam (MOBIC) 15 MG tablet Take 15 mg by mouth daily.   Yes [provider]  metFORMIN (GLUCOPHAGE) 1000 MG tablet Take 1,000 mg by mouth 2 (two) times daily with a meal.   Yes [provider]  pravastatin (PRAVACHOL) 40 MG tablet Take 40 mg by mouth daily.    Yes [provider]  HYDROcodone-acetaminophen (NORCO/VICODIN) 5-325 MG tablet Take 1 tablet by mouth every 6 (six) hours as needed for severe pain.    [provider]  predniSONE (DELTASONE) 50 MG tablet One tablet po daily for 5 days 11/18/18   Ripley Fraise, MD    Family History Family History  Problem Relation Age of Onset  . Diabetes Other   . Colon cancer Neg Hx     Social History Social History   Tobacco Use  . Smoking status: Former Smoker    Packs/day: 0.25    Years: 1.00    Pack years: 0.25    Types: Cigarettes  . Smokeless tobacco: Never Used  Substance Use Topics  .  Alcohol use: No  . Drug use: No     Allergies   Patient has no known allergies.   Review of Systems Review of Systems  Constitutional: Negative for fever.  Gastrointestinal: Negative for abdominal pain and bowel incontinence.  Genitourinary: Negative for bladder incontinence.  Musculoskeletal: Positive for back pain.  Neurological: Negative for weakness and numbness.  All other systems reviewed and are negative.    Physical Exam Updated Vital Signs BP (!) 143/67   Pulse 86   Temp 98.3 F (36.8 C) (Oral)   Resp 18   SpO2 94%   Physical Exam  CONSTITUTIONAL: Well developed/well nourished HEAD:  Normocephalic/atraumatic EYES: EOMI/PERRL ENMT: Mucous membranes moist NECK: supple no meningeal signs SPINE/BACK:entire spine nontender , lumbar paraspinal tenderness CV: S1/S2 noted, no murmurs/rubs/gallops noted LUNGS: Lungs are clear to auscultation bilaterally, no apparent distress ABDOMEN: soft, nontender, no rebound or guarding GU:no cva tenderness NEURO: Awake/alert, equal motor 5/5 strength noted with the following: hip flexion/knee flexion/extension, foot dorsi/plantar flexion, great toe extension intact bilaterally  no sensory deficit in any dermatome.   Pt is able to ambulate unassisted. EXTREMITIES: pulses normal, full ROM, no LE edema SKIN: warm, color normal PSYCH: no abnormalities of mood noted, alert and oriented to situation   ED Treatments / Results  Labs (all labs ordered are listed, but only abnormal results are displayed) Labs Reviewed - No data to display  EKG None  Radiology No results found.  Procedures Procedures (including critical care time)  Medications Ordered in ED Medications  ketorolac (TORADOL) injection 60 mg (60 mg Intramuscular Given 11/18/18 0536)     Initial Impression / Assessment and Plan / ED Course  I have reviewed the triage vital signs and the nursing notes.   Patient presents for low back pain that radiates to his right thigh.  This has been ongoing for at least a week.  He has been seen by his PCP with no relief. MRI from 2014 revealed spinal stenosis. Strong suspicion this represents radiculopathy.  He already has Vicodin at home.  He drove himself to the hospital so we will be unable to give him any sedating meds.  Toradol ordered.  I did offer adding a short course of prednisone for this condition, and he will start this.  He reports his blood glucoses is within normal limits most of the time, advised to monitor this Final Clinical Impressions(s) / ED Diagnoses   Final diagnoses:  Lumbosacral radiculopathy    ED  Discharge Orders         Ordered    predniSONE (DELTASONE) 50 MG tablet     11/18/18 0526           Ripley Fraise, MD 11/18/18 [phone]

## 2018-11-18 NOTE — Discharge Instructions (Addendum)

## 2019-03-23 ENCOUNTER — Emergency Department (HOSPITAL_COMMUNITY)
Admission: EM | Admit: 2019-03-23 | Discharge: 2019-03-23 | Disposition: A | Discharge status: Home / Self Care | Payer: Managed Care, Other (non HMO) | Attending: Emergency Medicine | Admitting: Emergency Medicine

## 2019-03-23 ENCOUNTER — Emergency Department (HOSPITAL_COMMUNITY): Payer: Managed Care, Other (non HMO)

## 2019-03-23 ENCOUNTER — Other Ambulatory Visit: Payer: Self-pay

## 2019-03-23 ENCOUNTER — Encounter (HOSPITAL_COMMUNITY): Payer: Self-pay | Admitting: Emergency Medicine

## 2019-03-23 DIAGNOSIS — Z87891 Personal history of nicotine dependence: Secondary | ICD-10-CM | POA: Diagnosis not present

## 2019-03-23 DIAGNOSIS — I10 Essential (primary) hypertension: Secondary | ICD-10-CM | POA: Diagnosis not present

## 2019-03-23 DIAGNOSIS — E119 Type 2 diabetes mellitus without complications: Secondary | ICD-10-CM | POA: Insufficient documentation

## 2019-03-23 DIAGNOSIS — J189 Pneumonia, unspecified organism: Secondary | ICD-10-CM | POA: Diagnosis not present

## 2019-03-23 DIAGNOSIS — E785 Hyperlipidemia, unspecified: Secondary | ICD-10-CM | POA: Insufficient documentation

## 2019-03-23 DIAGNOSIS — R2241 Localized swelling, mass and lump, right lower limb: Secondary | ICD-10-CM | POA: Insufficient documentation

## 2019-03-23 DIAGNOSIS — Z20828 Contact with and (suspected) exposure to other viral communicable diseases: Secondary | ICD-10-CM | POA: Diagnosis not present

## 2019-03-23 DIAGNOSIS — Z79899 Other long term (current) drug therapy: Secondary | ICD-10-CM | POA: Insufficient documentation

## 2019-03-23 DIAGNOSIS — R0602 Shortness of breath: Secondary | ICD-10-CM | POA: Diagnosis present

## 2019-03-23 LAB — CBC WITH DIFFERENTIAL/PLATELET
Abs Immature Granulocytes: 0.01 10*3/uL (ref 0.00–0.07)
Basophils Absolute: 0 10*3/uL (ref 0.0–0.1)
Basophils Relative: 0 %
Eosinophils Absolute: 0.2 10*3/uL (ref 0.0–0.5)
Eosinophils Relative: 3 %
HCT: 47.9 % (ref 39.0–52.0)
Hemoglobin: 14.9 g/dL (ref 13.0–17.0)
Immature Granulocytes: 0 %
Lymphocytes Relative: 37 %
Lymphs Abs: 2.8 10*3/uL (ref 0.7–4.0)
MCH: 28.7 pg (ref 26.0–34.0)
MCHC: 31.1 g/dL (ref 30.0–36.0)
MCV: 92.3 fL (ref 80.0–100.0)
Monocytes Absolute: 0.6 10*3/uL (ref 0.1–1.0)
Monocytes Relative: 8 %
Neutro Abs: 4 10*3/uL (ref 1.7–7.7)
Neutrophils Relative %: 52 %
Platelets: 170 10*3/uL (ref 150–400)
RBC: 5.19 MIL/uL (ref 4.22–5.81)
RDW: 14.3 % (ref 11.5–15.5)
WBC: 7.7 10*3/uL (ref 4.0–10.5)
nRBC: 0 % (ref 0.0–0.2)

## 2019-03-23 LAB — COMPREHENSIVE METABOLIC PANEL
ALT: 61 U/L — ABNORMAL HIGH (ref 0–44)
AST: 39 U/L (ref 15–41)
Albumin: 3.9 g/dL (ref 3.5–5.0)
Alkaline Phosphatase: 82 U/L (ref 38–126)
Anion gap: 12 (ref 5–15)
BUN: 12 mg/dL (ref 6–20)
CO2: 25 mmol/L (ref 22–32)
Calcium: 9.3 mg/dL (ref 8.9–10.3)
Chloride: 105 mmol/L (ref 98–111)
Creatinine, Ser: 1.02 mg/dL (ref 0.61–1.24)
GFR calc Af Amer: >60 mL/min (ref >60)
GFR calc non Af Amer: >60 mL/min (ref >60)
Glucose, Bld: 138 mg/dL — ABNORMAL HIGH (ref 70–99)
Potassium: 3.5 mmol/L (ref 3.5–5.1)
Sodium: 142 mmol/L (ref 135–145)
Total Bilirubin: 0.7 mg/dL (ref 0.3–1.2)
Total Protein: 7.9 g/dL (ref 6.5–8.1)

## 2019-03-23 LAB — SARS CORONAVIRUS 2 BY RT PCR (HOSPITAL ORDER, PERFORMED IN ~~LOC~~ HOSPITAL LAB): SARS Coronavirus 2: NEGATIVE

## 2019-03-23 LAB — TROPONIN I: Troponin I: <0.03 ng/mL (ref <0.03)

## 2019-03-23 LAB — D-DIMER, QUANTITATIVE: D-Dimer, Quant: 0.32 ug/mL-FEU (ref 0.00–0.50)

## 2019-03-23 MED ORDER — FUROSEMIDE 10 MG/ML IJ SOLN
40.0000 mg | Freq: Once | INTRAMUSCULAR | Status: AC
  Administered 2019-03-23: 40 mg via INTRAVENOUS
  Filled 2019-03-23: qty 4

## 2019-03-23 MED ORDER — SODIUM CHLORIDE 0.9 % IV SOLN
500.0000 mg | INTRAVENOUS | Status: DC
  Administered 2019-03-23: 500 mg via INTRAVENOUS
  Filled 2019-03-23: qty 500

## 2019-03-23 MED ORDER — AZITHROMYCIN 250 MG PO TABS: ORAL_TABLET | ORAL | 0 refills | Status: DC

## 2019-03-23 MED ORDER — IOHEXOL 350 MG/ML SOLN
100.0000 mL | Freq: Once | INTRAVENOUS | Status: AC | PRN
  Administered 2019-03-23: 100 mL via INTRAVENOUS

## 2019-03-23 MED ORDER — SODIUM CHLORIDE 0.9 % IV SOLN
1.0000 g | Freq: Once | INTRAVENOUS | Status: AC
  Administered 2019-03-23: 1 g via INTRAVENOUS
  Filled 2019-03-23: qty 10

## 2019-03-23 MED ORDER — LIDOCAINE-EPINEPHRINE (PF) 2 %-1:200000 IJ SOLN
INTRAMUSCULAR | Status: AC
  Filled 2019-03-23: qty 20

## 2019-03-23 MED ORDER — AMOXICILLIN 500 MG PO CAPS: 1000.0000 mg | ORAL_CAPSULE | Freq: Three times a day (TID) | ORAL | 0 refills | Status: DC

## 2019-03-23 NOTE — ED Provider Notes (Signed)
[facility] EMERGENCY DEPARTMENT Provider Note   CSN: [id number] Arrival date & time: 03/23/19  0754    History   Chief Complaint Chief Complaint  Patient presents with   Chest Pain    HPI DOCTOR SHEAHAN is a 58 y.o. male.     Shortness of breath for 3 days with associated cough and minimal leg swelling.  Some chest pain this morning.  Fever, sweats, chills, prolonged immobilization or travel.  No exposure to COVID.  Past medical history includes obesity, diabetes, hyperlipidemia, hypertension, gout.  Severity of symptoms is moderate.  Exertion makes symptoms worse.     Past Medical History:  Diagnosis Date   Diabetes mellitus without complication ([place])    Gout    Hyperlipidemia    Hypertension     Patient Active Problem List   Diagnosis Date Noted   Neuralgia of lower extremity 08/15/2012   ERECTILE DYSFUNCTION 08/29/2008   CERUMEN IMPACTION, BILATERAL 06/27/2008   GOUT 09/21/2007   HYPERLIPIDEMIA 10/12/2006   OBESITY NOS 10/12/2006   HYPERTENSION 10/12/2006   GERD 10/12/2006   HIATAL HERNIA 10/12/2006    Past Surgical History:  Procedure Laterality Date   CATARACT EXTRACTION W/PHACO Left 10/29/2018   Procedure: CATARACT EXTRACTION PHACO AND INTRAOCULAR LENS PLACEMENT ([place]);  Surgeon: Tonny Branch, MD;  Location: [facility] OR[facility];  Service: Ophthalmology;  Laterality: Left;  CDE: 43.37   COLONOSCOPY N/A 02/27/2014   Procedure: COLONOSCOPY;  Surgeon: Rogene Houston, MD;  Location: [facility] ENDO SUITE;  Service: Endoscopy;  Laterality: N/A;  830   NO PAST SURGERIES          Home Medications    Prior to Admission medications   Medication Sig Start Date End Date Taking? Authorizing Provider  allopurinol (ZYLOPRIM) 300 MG tablet Take 300 mg by mouth daily.     Yes [provider]  gabapentin (NEURONTIN) 300 MG capsule Take 300 mg by mouth daily.   Yes [provider]  HYDROcodone-acetaminophen (NORCO/VICODIN) 5-325 MG tablet Take 1 tablet  by mouth every 6 (six) hours as needed for severe pain.   Yes [provider]  lisinopril-hydrochlorothiazide (PRINZIDE,ZESTORETIC) 20-12.5 MG per tablet Take 1 tablet by mouth daily.     Yes [provider]  meloxicam (MOBIC) 15 MG tablet Take 15 mg by mouth daily.   Yes [provider]  metFORMIN (GLUCOPHAGE) 1000 MG tablet Take 1,000 mg by mouth 2 (two) times daily with a meal.   Yes [provider]  pravastatin (PRAVACHOL) 40 MG tablet Take 40 mg by mouth daily.    Yes [provider]  amoxicillin (AMOXIL) 500 MG capsule Take 2 capsules (1,000 mg total) by mouth 3 (three) times daily. 03/23/19   Nat Christen, MD  azithromycin [facility]) 250 MG tablet 1 tab daily for 4 days starting Sunday afternoon 03/23/19   Nat Christen, MD  predniSONE (DELTASONE) 50 MG tablet One tablet po daily for 5 days Patient not taking: Reported on 03/23/2019 11/18/18   Ripley Fraise, MD    Family History Family History  Problem Relation Age of Onset   Diabetes Other    Colon cancer Neg Hx     Social History Social History   Tobacco Use   Smoking status: Former Smoker    Packs/day: 0.25    Years: 1.00    Pack years: 0.25    Types: Cigarettes   Smokeless tobacco: Never Used  Substance Use Topics   Alcohol use: No   Drug use: No  Allergies   Patient has no known allergies.   Review of Systems Review of Systems  All other systems reviewed and are negative.    Physical Exam Updated Vital Signs BP (!) 159/120 (BP Location: Left Arm)    Pulse (!) 52    Temp 98.3 F (36.8 C) (Oral)    Resp 11    Ht 5\' 6"  (1.676 m)    Wt 136.1 kg    SpO2 98%    BMI 48.42 kg/m   Physical Exam Vitals signs and nursing note reviewed.  Constitutional:      Appearance: He is well-developed.     Comments: Elevated bmi  HENT:     Head: Normocephalic and atraumatic.  Eyes:     Conjunctiva/sclera: Conjunctivae normal.  Neck:     Musculoskeletal: Neck supple.    Cardiovascular:     Rate and Rhythm: Regular rhythm. Tachycardia present.  Pulmonary:     Effort: Pulmonary effort is normal.     Breath sounds: Normal breath sounds.  Abdominal:     General: Bowel sounds are normal.     Palpations: Abdomen is soft.  Musculoskeletal: Normal range of motion.  Skin:    General: Skin is warm and dry.     Comments: Minimal leg edema right greater than left.  Neurological:     Mental Status: He is alert and oriented to person, place, and time.  Psychiatric:        Behavior: Behavior normal.      ED Treatments / Results  Labs (all labs ordered are listed, but only abnormal results are displayed) Labs Reviewed  COMPREHENSIVE METABOLIC PANEL - Abnormal; Notable for the following components:      Result Value   Glucose, Bld 138 (*)    ALT 61 (*)    All other components within normal limits  SARS CORONAVIRUS 2 (HOSPITAL ORDER, [place] [facility])  CBC WITH DIFFERENTIAL/PLATELET  TROPONIN I  D-DIMER, QUANTITATIVE (NOT AT [facility])    EKG EKG Interpretation  Date/Time:  Saturday Mar 23 2019 08:05:40 EDT Ventricular Rate:  116 PR Interval:    QRS Duration: 94 QT Interval:  343 QTC Calculation: 477 R Axis:   48 Text Interpretation:  Sinus tachycardia Consider right atrial enlargement Nonspecific T abnormalities, diffuse leads Borderline prolonged QT interval Confirmed by Nat Christen [phone]) on 03/23/2019 9:21:28 AM   Radiology Ct Angio Chest Pe W And/or Wo Contrast  Result Date: 03/23/2019 CLINICAL DATA:  Dry cough since Wednesday. Swelling in legs. Chest pain today after waking. PE suspected, high pretest probability. EXAM: CT ANGIOGRAPHY CHEST WITH CONTRAST TECHNIQUE: Multidetector CT imaging of the chest was performed using the standard protocol during bolus administration of intravenous contrast. Multiplanar CT image reconstructions and MIPs were obtained to evaluate the vascular anatomy. CONTRAST:  118mL OMNIPAQUE IOHEXOL 350  MG/ML SOLN COMPARISON:  One-view chest x-ray 03/23/2019 FINDINGS: Cardiovascular: Heart size is normal. Pulmonary artery opacification is excellent. There are no focal filling defects to suggest pulmonary embolus. Aortic arch is within normal limits. Mediastinum/Nodes: Prominent lymphoid tissue is present at the hila bilaterally, right greater than left. Right paratracheal lymph node measures 1.7 cm. No significant hilar adenopathy is present. Again lead is within normal limits. Esophagus is unremarkable. Lungs/Pleura: Patchy ground-glass attenuation is present throughout both lungs. Airspace disease most prevalent at the lung bases, right greater than left. Scattered airspace disease is present in the upper and lower lobes. Bilateral pleural effusions are noted. Upper Abdomen: There is diffuse  fatty infiltration of the liver. Focal calcification likely represents prior granulomatous disease. Limited imaging of the upper abdomen is otherwise unremarkable. Musculoskeletal: Vertebral body heights alignment are maintained. No focal lytic or blastic lesions are present. Sternum is intact. Ribs are within normal limits. Review of the MIP images confirms the above findings. IMPRESSION: 1. Diffuse bilateral patchy airspace disease bilaterally. This likely represents combination of edema and airspace disease, right greater than left. There is some basilar prominence. Findings are most consistent with multi lobar nonspecific pneumonia. Edema is considered less likely. 2. Bilateral hilar adenopathy also supports infectious or inflammatory process. 3. Bilateral pleural effusions. Electronically Signed   By: San Morelle M.D.   On: 03/23/2019 13:31   US Venous Img Lower Unilateral Right  Result Date: 03/23/2019 CLINICAL DATA:  Right leg edema. EXAM: Right LOWER EXTREMITY VENOUS DOPPLER ULTRASOUND TECHNIQUE: Gray-scale sonography with graded compression, as well as color Doppler and duplex ultrasound were performed  to evaluate the lower extremity deep venous systems from the level of the common femoral vein and including the common femoral, femoral, profunda femoral, popliteal and calf veins including the posterior tibial, peroneal and gastrocnemius veins when visible. The superficial great saphenous vein was also interrogated. Spectral Doppler was utilized to evaluate flow at rest and with distal augmentation maneuvers in the common femoral, femoral and popliteal veins. COMPARISON:  None. FINDINGS: Contralateral Common Femoral Vein: Respiratory phasicity is normal and symmetric with the symptomatic side. No evidence of thrombus. Normal compressibility. Common Femoral Vein: No evidence of thrombus. Normal compressibility, respiratory phasicity and response to augmentation. Saphenofemoral Junction: No evidence of thrombus. Normal compressibility and flow on color Doppler imaging. Profunda Femoral Vein: No evidence of thrombus. Normal compressibility and flow on color Doppler imaging. Femoral Vein: No evidence of thrombus. Normal compressibility, respiratory phasicity and response to augmentation. Popliteal Vein: No evidence of thrombus. Normal compressibility, respiratory phasicity and response to augmentation. Calf Veins: No evidence of thrombus. Normal compressibility and flow on color Doppler imaging. Superficial Great Saphenous Vein: No evidence of thrombus. Normal compressibility. Venous Reflux:  None. Other Findings:  None. IMPRESSION: No evidence of deep venous thrombosis. Electronically Signed   By: Kerby Moors M.D.   On: 03/23/2019 11:39   Dg Chest Port 1 View  Result Date: 03/23/2019 CLINICAL DATA:  Dry cough.  Leg swelling. EXAM: PORTABLE CHEST 1 VIEW COMPARISON:  February 19, 2017 FINDINGS: Heart size not well evaluated due to the portable technique. However, heart size appears borderline. No pneumothorax. No nodules or masses. Increased interstitial markings may represent mild edema versus an atypical  infection. No focal infiltrate. IMPRESSION: 1. Increased interstitial opacities in the lungs suggesting pulmonary venous congestion/mild edema versus atypical infection. A PA and lateral chest x-ray may better evaluate. Electronically Signed   By: Dorise Bullion III M.D   On: 03/23/2019 09:54    Procedures Procedures (including critical care time)  Medications Ordered in ED Medications  azithromycin (ZITHROMAX) 500 mg in sodium chloride 0.9 % 250 mL IVPB (has no administration in time range)  lidocaine-EPINEPHrine (XYLOCAINE W/EPI) 2 %-1:200000 (PF) injection (has no administration in time range)  furosemide (LASIX) injection 40 mg (40 mg Intravenous Given 03/23/19 1058)  iohexol (OMNIPAQUE) 350 MG/ML injection 100 mL (100 mLs Intravenous Contrast Given 03/23/19 1202)  cefTRIAXone (ROCEPHIN) 1 g in sodium chloride 0.9 % 100 mL IVPB (1 g Intravenous New Bag/Given 03/23/19 1359)     Initial Impression / Assessment and Plan / ED Course  I have reviewed the triage  vital signs and the nursing notes.  Pertinent labs & imaging results that were available during my care of the patient were reviewed by me and considered in my medical decision making (see chart for details).        Patient presents with dyspnea and cough.  I am also concerned about a pulmonary embolism versus ACS versus pneumonia.  Vital signs reveal tachycardia.  Doppler study of right lower extremity shows no DVT.  CT angiogram of chest reveals diffuse patchy bilateral airspace disease.  Covid test negative.  He desires to go home.  IV Rocephin and IV Zithromax given in the ED.  Discharge home on amoxicillin and Zithromax.  Patient understands to return if symptoms worsen.  CRITICAL CARE Performed by: Nat Christen Total critical care time: 30 minutes Critical care time was exclusive of separately billable procedures and treating other patients. Critical care was necessary to treat or prevent imminent or life-threatening  deterioration. Critical care was time spent personally by me on the following activities: development of treatment plan with patient and/or surrogate as well as nursing, discussions with consultants, evaluation of patient's response to treatment, examination of patient, obtaining history from patient or surrogate, ordering and performing treatments and interventions, ordering and review of laboratory studies, ordering and review of radiographic studies, pulse oximetry and re-evaluation of patient's condition.  Final Clinical Impressions(s) / ED Diagnoses   Final diagnoses:  Community acquired pneumonia, unspecified laterality    ED Discharge Orders         Ordered    amoxicillin (AMOXIL) 500 MG capsule  3 times daily     03/23/19 1406    azithromycin (ZITHROMAX) 250 MG tablet     03/23/19 1406           Nat Christen, MD 03/23/19 1437

## 2019-03-23 NOTE — ED Triage Notes (Signed)
Pt reports dry cough since Wednesday with some swelling in legs.  Chest pain this morning after waking.  No fever.

## 2019-03-23 NOTE — Discharge Instructions (Signed)
Tests show evidence of pneumonia.  Prescription for 2 antibiotics.  Decrease salt in your diet.  Rest.  Return for worsening shortness of breath or chest pain, dehydration, or any concerns.

## 2019-03-30 ENCOUNTER — Observation Stay (HOSPITAL_BASED_OUTPATIENT_CLINIC_OR_DEPARTMENT_OTHER): Payer: Managed Care, Other (non HMO)

## 2019-03-30 ENCOUNTER — Observation Stay (HOSPITAL_COMMUNITY): Payer: Managed Care, Other (non HMO)

## 2019-03-30 ENCOUNTER — Encounter (HOSPITAL_COMMUNITY): Payer: Self-pay | Admitting: Emergency Medicine

## 2019-03-30 ENCOUNTER — Emergency Department (HOSPITAL_COMMUNITY): Payer: Managed Care, Other (non HMO)

## 2019-03-30 ENCOUNTER — Other Ambulatory Visit: Payer: Self-pay

## 2019-03-30 ENCOUNTER — Inpatient Hospital Stay (HOSPITAL_COMMUNITY)
Admission: EM | Admit: 2019-03-30 | Discharge: 2019-04-01 | DRG: 291 | Disposition: A | Discharge status: Home / Self Care | Payer: Managed Care, Other (non HMO) | Attending: Family Medicine | Admitting: Family Medicine

## 2019-03-30 DIAGNOSIS — E785 Hyperlipidemia, unspecified: Secondary | ICD-10-CM | POA: Diagnosis not present

## 2019-03-30 DIAGNOSIS — Z1159 Encounter for screening for other viral diseases: Secondary | ICD-10-CM

## 2019-03-30 DIAGNOSIS — Z7952 Long term (current) use of systemic steroids: Secondary | ICD-10-CM

## 2019-03-30 DIAGNOSIS — I428 Other cardiomyopathies: Secondary | ICD-10-CM | POA: Diagnosis present

## 2019-03-30 DIAGNOSIS — R0609 Other forms of dyspnea: Secondary | ICD-10-CM

## 2019-03-30 DIAGNOSIS — M109 Gout, unspecified: Secondary | ICD-10-CM | POA: Diagnosis not present

## 2019-03-30 DIAGNOSIS — Z7984 Long term (current) use of oral hypoglycemic drugs: Secondary | ICD-10-CM | POA: Diagnosis not present

## 2019-03-30 DIAGNOSIS — Z833 Family history of diabetes mellitus: Secondary | ICD-10-CM

## 2019-03-30 DIAGNOSIS — Z79899 Other long term (current) drug therapy: Secondary | ICD-10-CM

## 2019-03-30 DIAGNOSIS — Z792 Long term (current) use of antibiotics: Secondary | ICD-10-CM

## 2019-03-30 DIAGNOSIS — R945 Abnormal results of liver function studies: Secondary | ICD-10-CM

## 2019-03-30 DIAGNOSIS — R000 Tachycardia, unspecified: Secondary | ICD-10-CM | POA: Diagnosis present

## 2019-03-30 DIAGNOSIS — R0600 Dyspnea, unspecified: Secondary | ICD-10-CM | POA: Diagnosis not present

## 2019-03-30 DIAGNOSIS — I5021 Acute systolic (congestive) heart failure: Secondary | ICD-10-CM | POA: Diagnosis present

## 2019-03-30 DIAGNOSIS — R0602 Shortness of breath: Secondary | ICD-10-CM

## 2019-03-30 DIAGNOSIS — Z6841 Body Mass Index (BMI) 40.0 and over, adult: Secondary | ICD-10-CM

## 2019-03-30 DIAGNOSIS — E1140 Type 2 diabetes mellitus with diabetic neuropathy, unspecified: Secondary | ICD-10-CM | POA: Diagnosis not present

## 2019-03-30 DIAGNOSIS — Y95 Nosocomial condition: Secondary | ICD-10-CM | POA: Diagnosis present

## 2019-03-30 DIAGNOSIS — Z87891 Personal history of nicotine dependence: Secondary | ICD-10-CM | POA: Diagnosis not present

## 2019-03-30 DIAGNOSIS — I10 Essential (primary) hypertension: Secondary | ICD-10-CM | POA: Diagnosis not present

## 2019-03-30 DIAGNOSIS — J189 Pneumonia, unspecified organism: Secondary | ICD-10-CM | POA: Diagnosis not present

## 2019-03-30 DIAGNOSIS — Z79891 Long term (current) use of opiate analgesic: Secondary | ICD-10-CM | POA: Diagnosis not present

## 2019-03-30 DIAGNOSIS — I110 Hypertensive heart disease with heart failure: Secondary | ICD-10-CM | POA: Diagnosis not present

## 2019-03-30 DIAGNOSIS — I509 Heart failure, unspecified: Secondary | ICD-10-CM

## 2019-03-30 HISTORY — DX: Pneumonia, unspecified organism: J18.9

## 2019-03-30 LAB — LIPID PANEL
Cholesterol: 161 mg/dL (ref 0–200)
HDL: 49 mg/dL (ref >40)
LDL Cholesterol: 86 mg/dL (ref 0–99)
Total CHOL/HDL Ratio: 3.3 RATIO
Triglycerides: 131 mg/dL (ref <150)
VLDL: 26 mg/dL (ref 0–40)

## 2019-03-30 LAB — CBC WITH DIFFERENTIAL/PLATELET
Abs Immature Granulocytes: 0.01 10*3/uL (ref 0.00–0.07)
Basophils Absolute: 0 10*3/uL (ref 0.0–0.1)
Basophils Relative: 0 %
Eosinophils Absolute: 0.3 10*3/uL (ref 0.0–0.5)
Eosinophils Relative: 4 %
HCT: 45.2 % (ref 39.0–52.0)
Hemoglobin: 13.9 g/dL (ref 13.0–17.0)
Immature Granulocytes: 0 %
Lymphocytes Relative: 38 %
Lymphs Abs: 3.1 10*3/uL (ref 0.7–4.0)
MCH: 28.3 pg (ref 26.0–34.0)
MCHC: 30.8 g/dL (ref 30.0–36.0)
MCV: 91.9 fL (ref 80.0–100.0)
Monocytes Absolute: 0.6 10*3/uL (ref 0.1–1.0)
Monocytes Relative: 7 %
Neutro Abs: 4.1 10*3/uL (ref 1.7–7.7)
Neutrophils Relative %: 51 %
Platelets: 167 10*3/uL (ref 150–400)
RBC: 4.92 MIL/uL (ref 4.22–5.81)
RDW: 14.2 % (ref 11.5–15.5)
WBC: 8.1 10*3/uL (ref 4.0–10.5)
nRBC: 0 % (ref 0.0–0.2)

## 2019-03-30 LAB — BASIC METABOLIC PANEL
Anion gap: 11 (ref 5–15)
BUN: 13 mg/dL (ref 6–20)
CO2: 22 mmol/L (ref 22–32)
Calcium: 9.6 mg/dL (ref 8.9–10.3)
Chloride: 108 mmol/L (ref 98–111)
Creatinine, Ser: 0.91 mg/dL (ref 0.61–1.24)
GFR calc Af Amer: >60 mL/min (ref >60)
GFR calc non Af Amer: >60 mL/min (ref >60)
Glucose, Bld: 137 mg/dL — ABNORMAL HIGH (ref 70–99)
Potassium: 3.8 mmol/L (ref 3.5–5.1)
Sodium: 141 mmol/L (ref 135–145)

## 2019-03-30 LAB — D-DIMER, QUANTITATIVE: D-Dimer, Quant: 0.28 ug/mL-FEU (ref 0.00–0.50)

## 2019-03-30 LAB — BRAIN NATRIURETIC PEPTIDE: B Natriuretic Peptide: 79 pg/mL (ref 0.0–100.0)

## 2019-03-30 LAB — IRON AND TIBC
Iron: 35 ug/dL — ABNORMAL LOW (ref 45–182)
Saturation Ratios: 11 % — ABNORMAL LOW (ref 17.9–39.5)
TIBC: 332 ug/dL (ref 250–450)
UIBC: 297 ug/dL

## 2019-03-30 LAB — ECHOCARDIOGRAM COMPLETE
Height: 66 in
Weight: 4832.48 oz

## 2019-03-30 LAB — SARS CORONAVIRUS 2 BY RT PCR (HOSPITAL ORDER, PERFORMED IN ~~LOC~~ HOSPITAL LAB): SARS Coronavirus 2: NEGATIVE

## 2019-03-30 LAB — TROPONIN I
Troponin I: <0.03 ng/mL (ref <0.03)
Troponin I: 0.03 ng/mL (ref <0.03)

## 2019-03-30 LAB — TSH: TSH: 3.72 u[IU]/mL (ref 0.350–4.500)

## 2019-03-30 LAB — FERRITIN: Ferritin: 27 ng/mL (ref 24–336)

## 2019-03-30 MED ORDER — PRAVASTATIN SODIUM 40 MG PO TABS
40.0000 mg | ORAL_TABLET | Freq: Every day | ORAL | Status: DC
  Administered 2019-03-30 – 2019-04-01 (×3): 40 mg via ORAL
  Filled 2019-03-30 (×3): qty 1

## 2019-03-30 MED ORDER — TECHNETIUM TC 99M TETROFOSMIN IV KIT
30.0000 | PACK | Freq: Once | INTRAVENOUS | Status: AC | PRN
  Administered 2019-03-30: 30 via INTRAVENOUS

## 2019-03-30 MED ORDER — ENOXAPARIN SODIUM 40 MG/0.4ML ~~LOC~~ SOLN: 40.0000 mg | SUBCUTANEOUS | Status: DC

## 2019-03-30 MED ORDER — LABETALOL HCL 5 MG/ML IV SOLN
10.0000 mg | Freq: Once | INTRAVENOUS | Status: AC
  Administered 2019-03-30: 09:00:00 10 mg via INTRAVENOUS
  Filled 2019-03-30: qty 4

## 2019-03-30 MED ORDER — SODIUM CHLORIDE 0.9 % IV SOLN
INTRAVENOUS | Status: DC | PRN
  Administered 2019-03-30: 500 mL via INTRAVENOUS
  Administered 2019-04-01 (×2): 250 mL via INTRAVENOUS

## 2019-03-30 MED ORDER — REGADENOSON 0.4 MG/5ML IV SOLN
0.4000 mg | Freq: Once | INTRAVENOUS | Status: AC
  Administered 2019-03-30: 0.4 mg via INTRAVENOUS
  Filled 2019-03-30: qty 5

## 2019-03-30 MED ORDER — SODIUM CHLORIDE 0.9 % IV SOLN: 250.0000 mL | INTRAVENOUS | Status: DC | PRN

## 2019-03-30 MED ORDER — ALLOPURINOL 300 MG PO TABS
300.0000 mg | ORAL_TABLET | Freq: Every day | ORAL | Status: DC
  Administered 2019-03-30 – 2019-04-01 (×3): 300 mg via ORAL
  Filled 2019-03-30 (×3): qty 1

## 2019-03-30 MED ORDER — VANCOMYCIN HCL 10 G IV SOLR
1250.0000 mg | Freq: Two times a day (BID) | INTRAVENOUS | Status: DC
  Administered 2019-03-31 – 2019-04-01 (×3): 1250 mg via INTRAVENOUS
  Filled 2019-03-30 (×4): qty 1250

## 2019-03-30 MED ORDER — REGADENOSON 0.4 MG/5ML IV SOLN
INTRAVENOUS | Status: AC
  Filled 2019-03-30: qty 5

## 2019-03-30 MED ORDER — FUROSEMIDE 10 MG/ML IJ SOLN
40.0000 mg | Freq: Once | INTRAMUSCULAR | Status: AC
  Administered 2019-03-30: 04:00:00 40 mg via INTRAVENOUS
  Filled 2019-03-30: qty 4

## 2019-03-30 MED ORDER — VANCOMYCIN HCL 10 G IV SOLR
2500.0000 mg | INTRAVENOUS | Status: AC
  Administered 2019-03-30: 2500 mg via INTRAVENOUS
  Filled 2019-03-30: qty 2500

## 2019-03-30 MED ORDER — AMLODIPINE BESYLATE 2.5 MG PO TABS
5.0000 mg | ORAL_TABLET | Freq: Every day | ORAL | Status: DC
  Administered 2019-03-30 – 2019-03-31 (×2): 5 mg via ORAL
  Filled 2019-03-30 (×3): qty 2

## 2019-03-30 MED ORDER — METFORMIN HCL 500 MG PO TABS
1000.0000 mg | ORAL_TABLET | Freq: Two times a day (BID) | ORAL | Status: DC
  Administered 2019-03-30 – 2019-04-01 (×4): 1000 mg via ORAL
  Filled 2019-03-30 (×4): qty 2

## 2019-03-30 MED ORDER — LISINOPRIL 20 MG PO TABS
20.0000 mg | ORAL_TABLET | Freq: Every day | ORAL | Status: DC
  Administered 2019-03-30 – 2019-04-01 (×3): 20 mg via ORAL
  Filled 2019-03-30 (×3): qty 1

## 2019-03-30 MED ORDER — GABAPENTIN 300 MG PO CAPS
300.0000 mg | ORAL_CAPSULE | Freq: Every day | ORAL | Status: DC
  Administered 2019-03-30 – 2019-04-01 (×3): 300 mg via ORAL
  Filled 2019-03-30 (×3): qty 1

## 2019-03-30 MED ORDER — HYDROCODONE-ACETAMINOPHEN 5-325 MG PO TABS: 1.0000 | ORAL_TABLET | Freq: Four times a day (QID) | ORAL | Status: DC | PRN

## 2019-03-30 MED ORDER — FUROSEMIDE 10 MG/ML IJ SOLN
20.0000 mg | Freq: Every day | INTRAMUSCULAR | Status: DC
  Administered 2019-03-30 – 2019-03-31 (×2): 20 mg via INTRAVENOUS
  Filled 2019-03-30 (×2): qty 2

## 2019-03-30 MED ORDER — SODIUM CHLORIDE 0.9 % IV SOLN
2.0000 g | Freq: Three times a day (TID) | INTRAVENOUS | Status: DC
  Administered 2019-03-30 – 2019-04-01 (×7): 2 g via INTRAVENOUS
  Filled 2019-03-30 (×9): qty 2

## 2019-03-30 MED ORDER — ALLOPURINOL 300 MG PO TABS: 300.0000 mg | ORAL_TABLET | Freq: Every day | ORAL | Status: DC

## 2019-03-30 MED ORDER — ENOXAPARIN SODIUM 80 MG/0.8ML ~~LOC~~ SOLN
70.0000 mg | SUBCUTANEOUS | Status: DC
  Administered 2019-03-30: 70 mg via SUBCUTANEOUS
  Filled 2019-03-30: qty 0.8

## 2019-03-30 MED ORDER — SODIUM CHLORIDE 0.9% FLUSH: 3.0000 mL | INTRAVENOUS | Status: DC | PRN

## 2019-03-30 MED ORDER — VANCOMYCIN HCL IN DEXTROSE 1-5 GM/200ML-% IV SOLN: 1000.0000 mg | INTRAVENOUS | Status: AC

## 2019-03-30 MED ORDER — SODIUM CHLORIDE 0.9% FLUSH
3.0000 mL | Freq: Two times a day (BID) | INTRAVENOUS | Status: DC
  Administered 2019-03-30 – 2019-04-01 (×4): 3 mL via INTRAVENOUS

## 2019-03-30 MED ORDER — HYDRALAZINE HCL 20 MG/ML IJ SOLN
10.0000 mg | INTRAMUSCULAR | Status: DC | PRN
  Administered 2019-03-30: 08:00:00 10 mg via INTRAVENOUS
  Filled 2019-03-30: qty 1

## 2019-03-30 NOTE — ED Notes (Signed)
Pt has Maxipime and Vancomycin due at 0815 and 0830. Medications out of stock in the pyxis currently. [facility] at bedside ready to transport pt. Receiving nurse notified of medication need upon arrival to [facility].

## 2019-03-30 NOTE — Progress Notes (Signed)
   Roberto Guerra presented for a 2 Day Lexiscan cardiolite today.  No immediate complications.  Stress imaging is pending at this time.  Resting imaging will not be completed until 5/24, since this is a 2 day study.  Murray Hodgkins, NP 03/30/2019, 12:05 PM

## 2019-03-30 NOTE — Plan of Care (Signed)
  Problem: Education: Goal: Ability to demonstrate management of disease process will improve Outcome: Progressing   Problem: Cardiac: Goal: Ability to achieve and maintain adequate cardiopulmonary perfusion will improve Outcome: Progressing

## 2019-03-30 NOTE — ED Provider Notes (Signed)
[facility] EMERGENCY DEPARTMENT Provider Note   CSN: [id number] Arrival date & time: 03/30/19  0141    History   Chief Complaint Chief Complaint  Patient presents with  . Shortness of Breath    HPI Roberto Guerra is a 58 y.o. male.     Patient with history of morbid obesity, diabetes, hypertension, hyperlipidemia presents with sudden onset shortness of breath worsening since yesterday afternoon.  Associated with dry cough.  Breathing is worse when he lies flat and better when he sits up.  He was seen in the ED on May 16 and diagnosed with pneumonia as well as "too much fluid".  He was sent home with antibiotics which she finished was not sent home with any diuretics.  He denies any history of heart failure.  States he normally is able to lay flat.  Difficulty lying flat tonight.  States he has a dry cough.  Denies chest pain, chills, fever, nausea or vomiting.  Denies any coronavirus exposures or contacts.  Denies any leg pain or leg swelling.  Does have a history of sleep apnea but does not use a CPAP machine.  The history is provided by the patient.  Shortness of Breath  Associated symptoms: cough   Associated symptoms: no abdominal pain, no chest pain, no headaches, no rash and no vomiting     Past Medical History:  Diagnosis Date  . Diabetes mellitus without complication ([place])   . Gout   . Hyperlipidemia   . Hypertension   . PNA (pneumonia)     Patient Active Problem List   Diagnosis Date Noted  . Neuralgia of lower extremity 08/15/2012  . ERECTILE DYSFUNCTION 08/29/2008  . CERUMEN IMPACTION, BILATERAL 06/27/2008  . GOUT 09/21/2007  . HYPERLIPIDEMIA 10/12/2006  . OBESITY NOS 10/12/2006  . HYPERTENSION 10/12/2006  . GERD 10/12/2006  . HIATAL HERNIA 10/12/2006    Past Surgical History:  Procedure Laterality Date  . CATARACT EXTRACTION W/PHACO Left 10/29/2018   Procedure: CATARACT EXTRACTION PHACO AND INTRAOCULAR LENS PLACEMENT ([facility]);  Surgeon: Tonny Branch, MD;   Location: [facility];  Service: Ophthalmology;  Laterality: Left;  CDE: 43.37  . COLONOSCOPY N/A 02/27/2014   Procedure: COLONOSCOPY;  Surgeon: Rogene Houston, MD;  Location: [facility] END[facility] SUITE;  Service: Endoscopy;  Laterality: N/A;  830  . NO PAST SURGERIES          Home Medications    Prior to Admission medications   Medication Sig Start Date End Date Taking? Authorizing Provider  allopurinol (ZYLOPRIM) 300 MG tablet Take 300 mg by mouth daily.     Yes [provider]  amoxicillin (AMOXIL) 500 MG capsule Take 2 capsules (1,000 mg total) by mouth 3 (three) times daily. 03/23/19  Yes Nat Christen, MD  azithromycin [facility]) 250 MG tablet 1 tab daily for 4 days starting Sunday afternoon 03/23/19  Yes Nat Christen, MD  gabapentin (NEURONTIN) 300 MG capsule Take 300 mg by mouth daily.   Yes [provider]  HYDROcodone-acetaminophen (NORCO/VICODIN) 5-325 MG tablet Take 1 tablet by mouth every 6 (six) hours as needed for severe pain.   Yes [provider]  lisinopril-hydrochlorothiazide (PRINZIDE,ZESTORETIC) 20-12.5 MG per tablet Take 1 tablet by mouth daily.     Yes [provider]  meloxicam (MOBIC) 15 MG tablet Take 15 mg by mouth daily.   Yes [provider]  metFORMIN (GLUCOPHAGE) 1000 MG tablet Take 1,000 mg by mouth 2 (two) times daily with a meal.   Yes [provider]  pravastatin (PRAVACHOL) 40 MG tablet Take 40 mg by mouth daily.    Yes [provider]  predniSONE (DELTASONE) 50 MG tablet One tablet po daily for 5 days 11/18/18  Yes Ripley Fraise, MD    Family History Family History  Problem Relation Age of Onset  . Diabetes Other   . Colon cancer Neg Hx     Social History Social History   Tobacco Use  . Smoking status: Former Smoker    Packs/day: 0.25    Years: 1.00    Pack years: 0.25    Types: Cigarettes  . Smokeless tobacco: Never Used  Substance Use Topics  . Alcohol use: No  . Drug use: No      Allergies   Patient has no known allergies.   Review of Systems Review of Systems  Constitutional: Negative for appetite change.  HENT: Negative for congestion and rhinorrhea.   Eyes: Negative for visual disturbance.  Respiratory: Positive for cough, chest tightness and shortness of breath.   Cardiovascular: Positive for leg swelling. Negative for chest pain.  Gastrointestinal: Negative for abdominal pain, nausea and vomiting.  Genitourinary: Negative for dysuria.  Musculoskeletal: Negative for arthralgias and myalgias.  Skin: Negative for rash.  Neurological: Negative for dizziness, weakness and headaches.    all other systems are negative except as noted in the HPI and PMH.   Physical Exam Updated Vital Signs BP (!) 147/98   Pulse (!) 116   Temp 98.4 F (36.9 C) (Oral)   Resp (!) 23   Ht 5\' 6"  (1.676 m)   Wt (!) 137 kg   SpO2 94%   BMI 48.75 kg/m   Physical Exam Vitals signs and nursing note reviewed.  Constitutional:      General: He is not in acute distress.    Appearance: He is well-developed. He is obese.     Comments: Mildly increased work of breathing, speaking short sentences  HENT:     Head: Normocephalic and atraumatic.     Mouth/Throat:     Pharynx: No oropharyngeal exudate.  Eyes:     Conjunctiva/sclera: Conjunctivae normal.     Pupils: Pupils are equal, round, and reactive to light.  Neck:     Musculoskeletal: Normal range of motion and neck supple.     Comments: No meningismus. Cardiovascular:     Rate and Rhythm: Regular rhythm. Tachycardia present.     Heart sounds: Normal heart sounds. No murmur.     Comments: Tachycardic to the 120s Pulmonary:     Effort: Pulmonary effort is normal. No respiratory distress.     Breath sounds: Rales present.     Comments: Diminished breath sounds at the bases with poor air exchange Abdominal:     Palpations: Abdomen is soft.     Tenderness: There is no abdominal tenderness. There is no guarding or rebound.   Musculoskeletal: Normal range of motion.        General: No tenderness.  Skin:    General: Skin is warm.     Capillary Refill: Capillary refill takes less than 2 seconds.  Neurological:     General: No focal deficit present.     Mental Status: He is alert and oriented to person, place, and time. Mental status is at baseline.     Cranial Nerves: No cranial nerve deficit.     Motor: No abnormal muscle tone.     Coordination: Coordination normal.     Comments: No ataxia on finger to nose bilaterally.  No pronator drift. 5/5 strength throughout. CN 2-12 intact.Equal grip strength. Sensation intact.   Psychiatric:        Behavior: Behavior normal.      ED Treatments / Results  Labs (all labs ordered are listed, but only abnormal results are displayed) Labs Reviewed  BASIC METABOLIC PANEL - Abnormal; Notable for the following components:      Result Value   Glucose, Bld 137 (*)    All other components within normal limits  SARS CORONAVIRUS 2 (HOSPITAL ORDER, [place] LAB)  CBC WITH DIFFERENTIAL/PLATELET  TROPONIN I  BRAIN NATRIURETIC PEPTIDE  D-DIMER, QUANTITATIVE (NOT AT [facility])    EKG EKG Interpretation  Date/Time:  Saturday Mar 30 2019 01:52:50 EDT Ventricular Rate:  127 PR Interval:    QRS Duration: 86 QT Interval:  310 QTC Calculation: 451 R Axis:   60 Text Interpretation:  Sinus tachycardia Paired ventricular premature complexes Borderline repolarization abnormality Baseline wander in lead(s) V6 Artifact No significant change was found Confirmed by Ezequiel Essex [phone]) on 03/30/2019 2:47:33 AM   Radiology Dg Chest Port 1 View  Result Date: 03/30/2019 CLINICAL DATA:  Increasing shortness of breath EXAM: PORTABLE CHEST 1 VIEW COMPARISON:  CT from 03/23/2019 FINDINGS: Cardiac shadow is stable. Mild increased density is again noted in the bases bilaterally consistent with small effusions as well as the previously seen infiltrates. No new focal  abnormality is noted. IMPRESSION: Stable appearance of effusions and mild infiltrates. Electronically Signed   By: Inez Catalina M.D.   On: 03/30/2019 03:10    Procedures Procedures (including critical care time)  Medications Ordered in ED Medications  furosemide (LASIX) injection 40 mg (has no administration in time range)     Initial Impression / Assessment and Plan / ED Course  I have reviewed the triage vital signs and the nursing notes.  Pertinent labs & imaging results that were available during my care of the patient were reviewed by me and considered in my medical decision making (see chart for details).       Worsening shortness of breath with recent evaluation for pneumonia and CHF.  Patient tachypneic tachycardic and hypoxic.  His EKG shows sinus tachycardia without acute ST changes.  Patient given IV Lasix. Chest x-ray shows interstitial edema and pleural effusions.  Patient did have CT angiogram last week which showed no pulmonary embolism but did show airspace disease and pleural effusions.  Labs are reassuring today negative troponin, negative d-dimer, stable chest x-ray.  CT a reviewed from last week.  Patient with good response to IV Lasix.  He did become somewhat short of breath and tachypneic with ambulation but not hypoxic.  Tachycardia persists.  D-dimer today is negative.  Doubt pulmonary embolism.  Patient appears to still be volume overloaded with new onset CHF.  Would benefit from continued IV diuresis.  Discussed with Dr. Maudie Mercury.  CRITICAL CARE Performed by: Ezequiel Essex Total critical care time: 33 minutes Critical care time was exclusive of separately billable procedures and treating other patients. Critical care was necessary to treat or prevent imminent or life-threatening deterioration. Critical care was time spent personally by me on the following activities: development of treatment plan with patient and/or surrogate as well as nursing,  discussions with consultants, evaluation of patient's response to treatment, examination of patient, obtaining history from patient or surrogate, ordering and performing treatments and interventions, ordering and review of laboratory studies, ordering and review of radiographic studies, pulse oximetry and re-evaluation of patient's condition.  Final Clinical Impressions(s) / ED Diagnoses   Final diagnoses:  Acute on chronic congestive heart failure, unspecified heart failure type [facility])    ED Discharge Orders    None       Ezequiel Essex, MD 03/30/19 Delrae Rend

## 2019-03-30 NOTE — ED Notes (Signed)
[facility] reported to RN that pt's BP was 215/143 on both arms upon pt standing and getting on stretcher. They allowed pt to rest for a few minutes and BP rechecked and was 191/94, HR 116. Dr. Manuella Ghazi called to notify prior to transport. Dr. Manuella Ghazi will enter order for Labetalol IV prior to transport.

## 2019-03-30 NOTE — ED Notes (Signed)
Pt ambulated around nurses station with mask on- pt did not c/o shortness of breath. Pt O2 saturation remained between 92-94% on room air. Pt HR increased from 107 to 117, then went back down within one minute of sitting down. Pt says he feels better and is breathing better. Pt reports at home he was sleeping flat and feels better when he sits up. Dr Wyvonnia Dusky made aware.

## 2019-03-30 NOTE — ED Notes (Signed)
Dr. Kim at bedside   

## 2019-03-30 NOTE — ED Notes (Signed)
CRITICAL VALUE ALERT  Critical Value:  Troponin 0.03  Date & Time Notied:  03/30/19 0911  Provider Notified: Dr. Manuella Ghazi  Orders Received/Actions taken: MD notified via text page

## 2019-03-30 NOTE — Progress Notes (Signed)
Pharmacy Antibiotic Note  Roberto Guerra is a 58 y.o. male admitted on 03/30/2019 with pneumonia.  Pharmacy has been consulted for Vanco/Cefepime dosing.  CC/HPI: SOB  PMH: morbid obesity, diabetes, hypertension, hyperlipidemia, OSA, gout, recent PNA, neuropathy,   Significant events: ED on May 16 and diagnosed with pneumonia as well as "too much fluid".  He was sent home with antibiotics which she finished was not sent home with any diuretics.  ID: HCAP. Afebrile. WBC WNL, Scr WNL.   Vanco 5/23>> Cefepime 5/23>>  5/23: COVID negative.  5/23: BC x 2>>  Vancomycin 1250 mg IV Q 12 hrs. Goal AUC 400-550. Expected AUC: 504.9 SCr used: 0.91   Plan: Cefepime 2g IV q 8 hrs. Vanco 2g IV ordered at [facility] but NOT CHARTED as given. Reorder Vanco 2500mg  IV bolus, then 1250mg  IV q 12h. Vancomycin levels at steady state after 3-5 doses if continued   Height: 5\' 6"  (167.6 cm) Weight: (!) 302 lb 0.5 oz (137 kg) IBW/kg (Calculated) : 63.8  Temp (24hrs), Avg:98.2 F (36.8 C), Min:97.8 F (36.6 C), Max:98.4 F (36.9 C)  Recent Labs  Lab 03/30/19 0205  WBC 8.1  CREATININE 0.91    Estimated Creatinine Clearance: 119.4 mL/min (by C-G formula based on SCr of 0.91 mg/dL).    No Known Allergies   Roberto Guerra S. Alford Highland, PharmD, [place] Clinical Staff Pharmacist [name] 03/30/2019 1:30 PM

## 2019-03-30 NOTE — ED Triage Notes (Signed)
Pt seen here last Sat and dx with PNA. Pt here tonight with increased SOB since 1500 Friday with dry cough.

## 2019-03-30 NOTE — Progress Notes (Signed)
Roberto Guerra  is a 58 y.o. male, w hypertension, hyperlipidemia, Dm2, neuropathy apparently recently tx for pneumonnia with zithromax, apparently presents with c/o worsening dyspnea last nite.  Pt notes orthopnea, pt has had slight leg swelling but this has actually improved.  Pt denies weight gain, fever, chills, cough, cp, palp, n/v, diarrhea, brbpr, black stool, dysuria, hematuria.  Pt will be admitted for dyspnea, tachycardia. ? CAP.  03/30/19: seen and examined at his bedside at [facility]. Reports mild left sided chest pain reproducible on palpation and worse with cough. No dyspnea at rest at this time.  On IV vanc and cefepime for HCAP.  Please refer to H&P dictated by Dr Maudie Mercury on 03/30/19 for further details of the assessment and plan.

## 2019-03-30 NOTE — Progress Notes (Signed)
*  PRELIMINARY RESULTS* Echocardiogram 2D Echocardiogram has been performed.  Roberto Guerra 03/30/2019, 1:25 PM

## 2019-03-30 NOTE — H&P (Addendum)
TRH H&P    Patient Demographics:    Roberto Guerra, is a 59 y.o. male  MRN: [id number]  DOB - [birth year]  Admit Date - 03/30/2019  Referring MD/NP/PA:  Ezequiel Essex  Outpatient Primary MD for the patient is Celene Squibb, MD  Patient coming from:  home  Chief complaint- dyspnea   HPI:    Roberto Guerra  is a 58 y.o. male, w hypertension, hyperlipidemia, Dm2, neuropathy apparently recently tx for pneumonnia with zithromax, apparently presents with c/o worsening dyspnea last nite.  Pt notes orthopnea, pt has had slight leg swelling but this has actually improved.  Pt denies weight gain, fever, chills, cough, cp, palp, n/v, diarrhea, brbpr, black stool, dysuria, hematuria.    In ED,  T 98.4 P 112, Bp 148/102, pox 90-94% Wt 137kg   CXR IMPRESSION: Stable appearance of effusions and mild infiltrates.  Wbc 8.1, Hgb 13.9, Plt 167 Na 141, K 3.8, Bun 13, Creatinine 0.91 Trop <0.03  BNP 79  EKG  ST at 125, nl axis, nl int, t upright in v4-6, prev inverted ?  Pt will be admitted for dyspnea, tachycardia. ? CAP   Review of systems:    In addition to the HPI above,  No Fever-chills, No Headache, No changes with Vision or hearing, No problems swallowing food or Liquids, No Chest pain,  + dry cough No Abdominal pain, No Nausea or Vomiting, bowel movements are regular, No Blood in stool or Urine, No dysuria, No new skin rashes or bruises, No new joints pains-aches,  No new weakness, tingling, numbness in any extremity, No recent weight gain or loss, No polyuria, polydypsia or polyphagia, No significant Mental Stressors.  All other systems reviewed and are negative.    Past History of the following :    Past Medical History:  Diagnosis Date  . Diabetes mellitus without complication ([place])   . Gout   . Hyperlipidemia   . Hypertension   . PNA (pneumonia)       Past Surgical History:   Procedure Laterality Date  . CATARACT EXTRACTION W/PHACO Left 10/29/2018   Procedure: CATARACT EXTRACTION PHACO AND INTRAOCULAR LENS PLACEMENT (IOC);  Surgeon: Tonny Branch, MD;  Location: [facility] ORS;  Service: Ophthalmology;  Laterality: Left;  CDE: 43.37  . COLONOSCOPY N/A 02/27/2014   Procedure: COLONOSCOPY;  Surgeon: Rogene Houston, MD;  Location: [facility] ENDO SUITE;  Service: Endoscopy;  Laterality: N/A;  830  . NO PAST SURGERIES        Social History:      Social History   Tobacco Use  . Smoking status: Former Smoker    Packs/day: 0.25    Years: 1.00    Pack years: 0.25    Types: Cigarettes  . Smokeless tobacco: Never Used  Substance Use Topics  . Alcohol use: No       Family History :     Family History  Problem Relation Age of Onset  . Diabetes Other   . Colon cancer Neg Hx        Home  Medications:   Prior to Admission medications   Medication Sig Start Date End Date Taking? Authorizing Provider  allopurinol (ZYLOPRIM) 300 MG tablet Take 300 mg by mouth daily.     Yes [provider]  amoxicillin (AMOXIL) 500 MG capsule Take 2 capsules (1,000 mg total) by mouth 3 (three) times daily. 03/23/19  Yes Nat Christen, MD  azithromycin [facility]) 250 MG tablet 1 tab daily for 4 days starting Sunday afternoon 03/23/19  Yes Nat Christen, MD  gabapentin (NEURONTIN) 300 MG capsule Take 300 mg by mouth daily.   Yes [provider]  HYDROcodone-acetaminophen (NORCO/VICODIN) 5-325 MG tablet Take 1 tablet by mouth every 6 (six) hours as needed for severe pain.   Yes [provider]  lisinopril-hydrochlorothiazide (PRINZIDE,ZESTORETIC) 20-12.5 MG per tablet Take 1 tablet by mouth daily.     Yes [provider]  meloxicam (MOBIC) 15 MG tablet Take 15 mg by mouth daily.   Yes [provider]  metFORMIN (GLUCOPHAGE) 1000 MG tablet Take 1,000 mg by mouth 2 (two) times daily with a meal.   Yes [provider]  pravastatin (PRAVACHOL) 40 MG  tablet Take 40 mg by mouth daily.    Yes [provider]  predniSONE (DELTASONE) 50 MG tablet One tablet po daily for 5 days 11/18/18  Yes Ripley Fraise, MD     Allergies:    No Known Allergies   Physical Exam:   Vitals  Blood pressure (!) 148/102, pulse (!) 112, temperature 98.4 F (36.9 C), temperature source Oral, resp. rate 20, height 5\' 6"  (1.676 m), weight (!) 137 kg, SpO2 94 %.  1.  General: aox3  2. Psychiatric: euthymic  3. Neurologic: cn2-12 intact, reflexes 2+ symmetric, diffuse with downgoing toes bilaterally, motor 5/5 in all4ext  4. HEENMT:  Anicteric, pupils, 1.76mm symmetric, direct, consensual, near intact Mmm Neck: no jvd  5. Respiratory : slight decrease in bs in bilateral bases, no wheezing, no crackles.   6. Cardiovascular : Tachy s1, s2, no m/g/r  7. Gastrointestinal:  Abd: soft, obese, nt, nd, +bs  8. Skin:  Ext: no c/c/e,  No rash  9.Musculoskeletal:  Good ROM  No adenopathy    Data Review:    CBC Recent Labs  Lab 03/23/19 0816 03/30/19 0205  WBC 7.7 8.1  HGB 14.9 13.9  HCT 47.9 45.2  PLT 170 167  MCV 92.3 91.9  MCH 28.7 28.3  MCHC 31.1 30.8  RDW 14.3 14.2  LYMPHSABS 2.8 3.1  MONOABS 0.6 0.6  EOSABS 0.2 0.3  BASOSABS 0.0 0.0   ------------------------------------------------------------------------------------------------------------------  Results for orders placed or performed during the hospital encounter of 03/30/19 (from the past 48 hour(s))  CBC with Differential/Platelet     Status: None   Collection Time: 03/30/19  2:05 AM  Result Value Ref Range   WBC 8.1 4.0 - 10.5 K/uL   RBC 4.92 4.22 - 5.81 MIL/uL   Hemoglobin 13.9 13.0 - 17.0 g/dL   HCT 45.2 39.0 - 52.0 %   MCV 91.9 80.0 - 100.0 fL   MCH 28.3 26.0 - 34.0 pg   MCHC 30.8 30.0 - 36.0 g/dL   RDW 14.2 11.5 - 15.5 %   Platelets 167 150 - 400 K/uL   nRBC 0.0 0.0 - 0.2 %   Neutrophils Relative % 51 %   Neutro Abs 4.1 1.7 - 7.7 K/uL    Lymphocytes Relative 38 %   Lymphs Abs 3.1 0.7 - 4.0 K/uL   Monocytes Relative 7 %  Monocytes Absolute 0.6 0.1 - 1.0 K/uL   Eosinophils Relative 4 %   Eosinophils Absolute 0.3 0.0 - 0.5 K/uL   Basophils Relative 0 %   Basophils Absolute 0.0 0.0 - 0.1 K/uL   Immature Granulocytes 0 %   Abs Immature Granulocytes 0.01 0.00 - 0.07 K/uL    Comment: Performed at [facility], [address] [place], [place] [address]  Basic metabolic panel     Status: Abnormal   Collection Time: 03/30/19  2:05 AM  Result Value Ref Range   Sodium 141 135 - 145 mmol/L   Potassium 3.8 3.5 - 5.1 mmol/L   Chloride 108 98 - 111 mmol/L   CO2 22 22 - 32 mmol/L   Glucose, Bld 137 (H) 70 - 99 mg/dL   BUN 13 6 - 20 mg/dL   Creatinine, Ser 0.91 0.61 - 1.24 mg/dL   Calcium 9.6 8.9 - 10.3 mg/dL   GFR calc non Af Amer >60 >60 mL/min   GFR calc Af Amer >60 >60 mL/min   Anion gap 11 5 - 15    Comment: Performed at [facility], [address], [place], [place] [address]  Troponin I - ONCE - STAT     Status: None   Collection Time: 03/30/19  2:05 AM  Result Value Ref Range   Troponin I <0.03 <0.03 ng/mL    Comment: Performed at [facility], [address] [place], [place] [address]  Brain natriuretic peptide     Status: None   Collection Time: 03/30/19  2:05 AM  Result Value Ref Range   B Natriuretic Peptide 79.0 0.0 - 100.0 pg/mL    Comment: Performed at [facility], [address] [place], [place] [address]  D-dimer, quantitative (not at [facility])     Status: None   Collection Time: 03/30/19  2:05 AM  Result Value Ref Range   D-Dimer, Quant 0.28 0.00 - 0.50 ug/mL-FEU    Comment: (NOTE) At the manufacturer cut-off of 0.50 ug/mL FEU, this assay has been documented to exclude PE with a sensitivity and negative predictive value of 97 to 99%.  At this time, this assay has not been approved by the FDA to exclude DVT/VTE. Results should be correlated with clinical presentation. Performed at [facility], [address], [place], [place] [address]   SARS Coronavirus 2 (CEPHEID - Performed in [facility] hospital lab), Hosp Order     Status: None   Collection Time: 03/30/19  3:05 AM  Result Value Ref Range   SARS Coronavirus 2 NEGATIVE NEGATIVE    Comment: (NOTE) If result is NEGATIVE SARS-CoV-2 target nucleic acids are NOT DETECTED. The SARS-CoV-2 RNA is generally detectable in upper and lower  respiratory specimens during the acute phase of infection. The lowest  concentration of SARS-CoV-2 viral copies this assay can detect is 250  copies / mL. A negative result does not preclude SARS-CoV-2 infection  and should not be used as the sole basis for treatment or other  patient management decisions.  A negative result may occur with  improper specimen collection / handling, submission of specimen other  than nasopharyngeal swab, presence of viral mutation(s) within the  areas targeted by this assay, and inadequate number of viral copies  (<250 copies / mL). A negative result must be combined with clinical  observations, patient history, and epidemiological information. If result is POSITIVE SARS-CoV-2 target nucleic acids are DETECTED. The SARS-CoV-2 RNA is generally detectable in upper and lower  respiratory specimens dur  ing the acute phase of infection.  Positive  results are indicative of active infection with SARS-CoV-2.  Clinical  correlation with patient history and other diagnostic information is  necessary to determine patient infection status.  Positive results do  not rule out bacterial infection or co-infection with other viruses. If result is PRESUMPTIVE POSTIVE SARS-CoV-2 nucleic acids MAY BE PRESENT.   A presumptive positive result was obtained on the submitted specimen  and confirmed on repeat testing.  While 2019 novel coronavirus  (SARS-CoV-2) nucleic acids may be present in the submitted sample  additional confirmatory testing may be necessary for epidemiological  and / or  clinical management purposes  to differentiate between  SARS-CoV-2 and other Sarbecovirus currently known to infect humans.  If clinically indicated additional testing with an alternate test  methodology [phone]) is advised. The SARS-CoV-2 RNA is generally  detectable in upper and lower respiratory sp ecimens during the acute  phase of infection. The expected result is Negative. Fact Sheet for Patients:  StrictlyIdeas.no Fact Sheet for Healthcare Providers: BankingDealers.co.za This test is not yet approved or cleared by the [place] FDA and has been authorized for detection and/or diagnosis of SARS-CoV-2 by FDA under an Emergency Use Authorization (EUA).  This EUA will remain in effect (meaning this test can be used) for the duration of the COVID-19 declaration under Section 564(b)(1) of the Act, 21 U.S.C. section 360bbb-3(b)(1), unless the authorization is terminated or revoked sooner. Performed at [facility], [address] [place], [place] [address]     Chemistries  Recent Labs  Lab 03/23/19 [phone] 03/30/19 0205  NA 142 141  K 3.5 3.8  CL 105 108  CO2 25 22  GLUCOSE 138* 137*  BUN 12 13  CREATININE 1.02 0.91  CALCIUM 9.3 9.6  AST 39  --   ALT 61*  --   ALKPHOS 82  --   BILITOT 0.7  --    ------------------------------------------------------------------------------------------------------------------  ------------------------------------------------------------------------------------------------------------------ GFR: Estimated Creatinine Clearance: 119.4 mL/min (by C-G formula based on SCr of 0.91 mg/dL). Liver Function Tests: Recent Labs  Lab 03/23/19 0816  AST 39  ALT 61*  ALKPHOS 82  BILITOT 0.7  PROT 7.9  ALBUMIN 3.9   No results for input(s): LIPASE, AMYLASE in the last 168 hours. No results for input(s): AMMONIA in the last 168 hours. Coagulation Profile: No results for input(s): INR, PROTIME in  the last 168 hours. Cardiac Enzymes: Recent Labs  Lab 03/23/19 0816 03/30/19 0205  TROPONINI <0.03 <0.03   BNP (last 3 results) No results for input(s): PROBNP in the last 8760 hours. HbA1C: No results for input(s): HGBA1C in the last 72 hours. CBG: No results for input(s): GLUCAP in the last 168 hours. Lipid Profile: No results for input(s): CHOL, HDL, LDLCALC, TRIG, CHOLHDL, LDLDIRECT in the last 72 hours. Thyroid Function Tests: No results for input(s): TSH, T4TOTAL, FREET4, T3FREE, THYROIDAB in the last 72 hours. Anemia Panel: No results for input(s): VITAMINB12, FOLATE, FERRITIN, TIBC, IRON, RETICCTPCT in the last 72 hours.  --------------------------------------------------------------------------------------------------------------- Urine analysis:    Component Value Date/Time   COLORURINE yellow 06/27/2008 1549   APPEARANCEUR Clear 06/27/2008 1549   LABSPEC 1.020 06/27/2008 1549   PHURINE 5.0 06/27/2008 1549   HGBUR negative 06/27/2008 1549   BILIRUBINUR negative 06/27/2008 1549   UROBILINOGEN 2.0 06/27/2008 1549   NITRITE negative 06/27/2008 1549      Imaging Results:    Dg Chest Port 1 View  Result Date: 03/30/2019 CLINICAL DATA:  Increasing shortness of  breath EXAM: PORTABLE CHEST 1 VIEW COMPARISON:  CT from 03/23/2019 FINDINGS: Cardiac shadow is stable. Mild increased density is again noted in the bases bilaterally consistent with small effusions as well as the previously seen infiltrates. No new focal abnormality is noted. IMPRESSION: Stable appearance of effusions and mild infiltrates. Electronically Signed   By: Inez Catalina M.D.   On: 03/30/2019 03:10      Assessment & Plan:    Principal Problem:   Dyspnea Active Problems:   Essential hypertension   Tachycardia  Dyspnea secondary to unclear etiology, ? Ischemic cardiomyopathy ddx pneumonia, pulmonary hypertension Tele Trop I q6h x3 Check cardiac echo Nuclear stress test r/o ischemic heart  disease Lasix 20mg  iv qday Consider cardiology consult depending upon echo results  ? Pneumonia on CXR Blood culture x2 Check urine strep, urine legionella antigen Start vanco iv, cefepime iv pharmacy to dose  Tachycardia (sinus), ischemic cardiomyopathy ddx hyperthyroidism D dimer negative Tele Check TSH Cardiac echo as above Start on carvedilol 3.125mg  po bid  Hypertension uncontrolled DC Lisinopril /hydrochlorothiazide  Start Lisinopril 20mg  po qday Start Lasix 20mg  iv qday Hydralazine 10mg  iv q6h prn sbp >160  Hyperlipidemia Cont Pravastatin 40mg  po qhs  Dm2 Cont Metformin 1000mg  po bid, depending upon EF might need to discontinue Check iron studies r/o hemochromatosis fsbs ac and qhs, ISS  Dm2 neuropathy Cont Gabapentin   Gout Cont allopurinol 300mg  po qday  H/o abnormal liver function Check ferritin, iron, tibc r/o hemochromatosis Check acute hepatitis  Check RUQ ultrasound  DVT Prophylaxis-   Lovenox - SCDs   AM Labs Ordered, also please review Full Orders  Family Communication: Admission, patients condition and plan of care including tests being ordered have been discussed with the patient who indicate understanding and agree with the plan and Code Status.  Code Status:  FULL CODE  Admission status: Observationt: Based on patients clinical presentation and evaluation of above clinical data, I have made determination that patient meets observation criteria at this time.  Time spent in minutes : 70 minutes   Jani Gravel M.D on 03/30/2019 at 5:17 AM

## 2019-03-31 ENCOUNTER — Encounter (HOSPITAL_COMMUNITY): Payer: Self-pay | Admitting: Cardiology

## 2019-03-31 ENCOUNTER — Observation Stay (HOSPITAL_COMMUNITY): Payer: Managed Care, Other (non HMO)

## 2019-03-31 DIAGNOSIS — E785 Hyperlipidemia, unspecified: Secondary | ICD-10-CM | POA: Diagnosis present

## 2019-03-31 DIAGNOSIS — E1140 Type 2 diabetes mellitus with diabetic neuropathy, unspecified: Secondary | ICD-10-CM | POA: Diagnosis present

## 2019-03-31 DIAGNOSIS — J189 Pneumonia, unspecified organism: Secondary | ICD-10-CM | POA: Diagnosis present

## 2019-03-31 DIAGNOSIS — I110 Hypertensive heart disease with heart failure: Secondary | ICD-10-CM | POA: Diagnosis present

## 2019-03-31 DIAGNOSIS — M109 Gout, unspecified: Secondary | ICD-10-CM | POA: Diagnosis present

## 2019-03-31 DIAGNOSIS — Z6841 Body Mass Index (BMI) 40.0 and over, adult: Secondary | ICD-10-CM | POA: Diagnosis not present

## 2019-03-31 DIAGNOSIS — I5023 Acute on chronic systolic (congestive) heart failure: Secondary | ICD-10-CM

## 2019-03-31 DIAGNOSIS — Z79899 Other long term (current) drug therapy: Secondary | ICD-10-CM | POA: Diagnosis not present

## 2019-03-31 DIAGNOSIS — R0600 Dyspnea, unspecified: Secondary | ICD-10-CM | POA: Diagnosis not present

## 2019-03-31 DIAGNOSIS — R000 Tachycardia, unspecified: Secondary | ICD-10-CM | POA: Diagnosis present

## 2019-03-31 DIAGNOSIS — Z7952 Long term (current) use of systemic steroids: Secondary | ICD-10-CM | POA: Diagnosis not present

## 2019-03-31 DIAGNOSIS — Z79891 Long term (current) use of opiate analgesic: Secondary | ICD-10-CM | POA: Diagnosis not present

## 2019-03-31 DIAGNOSIS — Y95 Nosocomial condition: Secondary | ICD-10-CM | POA: Diagnosis present

## 2019-03-31 DIAGNOSIS — Z1159 Encounter for screening for other viral diseases: Secondary | ICD-10-CM | POA: Diagnosis not present

## 2019-03-31 DIAGNOSIS — R0602 Shortness of breath: Secondary | ICD-10-CM | POA: Diagnosis present

## 2019-03-31 DIAGNOSIS — Z792 Long term (current) use of antibiotics: Secondary | ICD-10-CM | POA: Diagnosis not present

## 2019-03-31 DIAGNOSIS — I10 Essential (primary) hypertension: Secondary | ICD-10-CM | POA: Diagnosis not present

## 2019-03-31 DIAGNOSIS — I5021 Acute systolic (congestive) heart failure: Secondary | ICD-10-CM | POA: Diagnosis present

## 2019-03-31 DIAGNOSIS — Z7984 Long term (current) use of oral hypoglycemic drugs: Secondary | ICD-10-CM | POA: Diagnosis not present

## 2019-03-31 DIAGNOSIS — I428 Other cardiomyopathies: Secondary | ICD-10-CM | POA: Diagnosis present

## 2019-03-31 DIAGNOSIS — Z833 Family history of diabetes mellitus: Secondary | ICD-10-CM | POA: Diagnosis not present

## 2019-03-31 DIAGNOSIS — Z87891 Personal history of nicotine dependence: Secondary | ICD-10-CM | POA: Diagnosis not present

## 2019-03-31 LAB — COMPREHENSIVE METABOLIC PANEL
ALT: 31 U/L (ref 0–44)
AST: 25 U/L (ref 15–41)
Albumin: 3.2 g/dL — ABNORMAL LOW (ref 3.5–5.0)
Alkaline Phosphatase: 66 U/L (ref 38–126)
Anion gap: 11 (ref 5–15)
BUN: 12 mg/dL (ref 6–20)
CO2: 24 mmol/L (ref 22–32)
Calcium: 9.3 mg/dL (ref 8.9–10.3)
Chloride: 105 mmol/L (ref 98–111)
Creatinine, Ser: 1.08 mg/dL (ref 0.61–1.24)
GFR calc Af Amer: >60 mL/min (ref >60)
GFR calc non Af Amer: >60 mL/min (ref >60)
Glucose, Bld: 120 mg/dL — ABNORMAL HIGH (ref 70–99)
Potassium: 3.7 mmol/L (ref 3.5–5.1)
Sodium: 140 mmol/L (ref 135–145)
Total Bilirubin: 0.7 mg/dL (ref 0.3–1.2)
Total Protein: 6.5 g/dL (ref 6.5–8.1)

## 2019-03-31 LAB — CBC
HCT: 43.1 % (ref 39.0–52.0)
Hemoglobin: 13.4 g/dL (ref 13.0–17.0)
MCH: 28.2 pg (ref 26.0–34.0)
MCHC: 31.1 g/dL (ref 30.0–36.0)
MCV: 90.7 fL (ref 80.0–100.0)
Platelets: 163 10*3/uL (ref 150–400)
RBC: 4.75 MIL/uL (ref 4.22–5.81)
RDW: 13.9 % (ref 11.5–15.5)
WBC: 9.1 10*3/uL (ref 4.0–10.5)
nRBC: 0 % (ref 0.0–0.2)

## 2019-03-31 LAB — NM MYOCAR MULTI W/SPECT W/WALL MOTION / EF
Estimated workload: 1 METS
Exercise duration (min): 0 min
Exercise duration (sec): 0 s
MPHR: 164 {beats}/min
Peak HR: 120 {beats}/min
Percent HR: 73 %
Rest HR: 107 {beats}/min

## 2019-03-31 MED ORDER — CARVEDILOL 6.25 MG PO TABS
6.2500 mg | ORAL_TABLET | Freq: Two times a day (BID) | ORAL | Status: DC
  Administered 2019-03-31 – 2019-04-01 (×2): 6.25 mg via ORAL
  Filled 2019-03-31 (×2): qty 1

## 2019-03-31 MED ORDER — TECHNETIUM TC 99M TETROFOSMIN IV KIT
30.0000 | PACK | Freq: Once | INTRAVENOUS | Status: AC | PRN
  Administered 2019-03-31: 30 via INTRAVENOUS

## 2019-03-31 NOTE — Progress Notes (Signed)
PROGRESS NOTE  Roberto Guerra PPI:[id number] DOB: Sep 09, 1961 DOA: 03/30/2019 PCP: Celene Squibb, MD  HPI/Recap of past 24 hours: Roberto Guerra a58 y.o.male,w hypertension, hyperlipidemia, Dm2, neuropathy apparently recently tx for pneumonnia with zithromax, apparently presents with c/o worsening dyspnea last nite. Pt notes orthopnea, pt has had slight leg swelling but this has actually improved. Pt denies weight gain, fever, chills, cough, cp, palp, n/v, diarrhea, brbpr, black stool, dysuria, hematuria.  Admitted for chest pain rule out ACS and HCAP.  03/31/19:Patient seen and examined at his bedside.  No acute events overnight.  Denies chest pain.  Admits to dyspnea with ambulation.   Assessment/Plan: Principal Problem:   Dyspnea Active Problems:   Essential hypertension   Tachycardia  Chest pain rule out ACS Troponin negative x3 No specific ST-T changes on EKG Post nuclear stress test on 03/30/2019 CTA negative for PE 2D echo done on 03/30/2019 showed LVEF 40 to 45% Continue to closely monitor on telemetry  Newly diagnosed acute systolic CHF 2D echo done on 03/30/2019 showed LVEF 40 to 45% On IV Lasix 20 mg daily Start strict I's and O's and daily weight Cardiology consulted Does not have a cardiologist and has not seen one outpatient  HCAP Failed outpatient treatment for pneumonia Started on IV vancomycin and cefepime on admission empirically MRSA screening obtained if negative DC IV vancomycin Independent review chest x-ray done on admission which showed bibasilar infiltrates and bilateral pleural effusions Also independently reviewed CT which showed no PE Continue cefepime Obtain procalcitonin level in the morning Monitor fever curve and WBC Obtain CBC in the morning  Elevated ALT, resolved  Abdominal ultrasound showed cholelithiasis with no evidence of acute cholecystitis and fatty infiltration of the liver  Morbid obesity We will recommend weight loss  outpatient   Risks: High risk for decompensation due to new diagnosis of acute systolic CHF, H CAP requiring IV antibiotics, multiple comorbidities and age.  Patient will require least 2 midnights for further evaluation and treatment of present condition.   Code Status: Full code  Family Communication: We will call family if is okay with the patient  Disposition Plan: Home possibly in 1 to 2 days with clinical improvement and when Cardiology signed off.   Consultants:  Cardiology  Procedures:  None  Antimicrobials:  IV vancomycin and cefepime  DVT prophylaxis: Subcu Lovenox daily   Objective: Vitals:   03/31/19 0410 03/31/19 0754 03/31/19 1146 03/31/19 1151  BP: 135/89 (!) 130/97 (!) 149/100 (!) 141/88  Pulse: (!) 102 (!) 102 89 (!) 108  Resp: 20 17 18    Temp: 98 F (36.7 C) 98 F (36.7 C) 98.2 F (36.8 C)   TempSrc: Oral Oral Oral   SpO2: 95% 100% 99% 98%  Weight:      Height:        Intake/Output Summary (Last 24 hours) at 03/31/2019 1225 Last data filed at 03/31/2019 1025 Gross per 24 hour  Intake 1466.22 ml  Output 865 ml  Net 601.22 ml   Filed Weights   03/30/19 0156 03/31/19 0408  Weight: (!) 137 kg 135.2 kg    Exam:  . General: 58 y.o. year-old male well developed well nourished in no acute distress.  Alert and oriented x3. . Cardiovascular: Regular rate and rhythm with no rubs or gallops.  No thyromegaly or JVD noted.   Marland Kitchen Respiratory: Mild rales at bases with no wheezes noted.  Poor inspiratory effort. . Abdomen: Soft nontender nondistended with normal bowel sounds x4 quadrants. . Musculoskeletal:  Trace edema in lower extremities bilaterally. 2/4 pulses in all 4 extremities. Marland Kitchen Psychiatry: Mood is appropriate for condition and setting   Data Reviewed: CBC: Recent Labs  Lab 03/30/19 0205 03/31/19 0323  WBC 8.1 9.1  NEUTROABS 4.1  --   HGB 13.9 13.4  HCT 45.2 43.1  MCV 91.9 90.7  PLT 167 417   Basic Metabolic Panel: Recent Labs  Lab  03/30/19 0205 03/31/19 0323  NA 141 140  K 3.8 3.7  CL 108 105  CO2 22 24  GLUCOSE 137* 120*  BUN 13 12  CREATININE 0.91 1.08  CALCIUM 9.6 9.3   GFR: Estimated Creatinine Clearance: 99.8 mL/min (by C-G formula based on SCr of 1.08 mg/dL). Liver Function Tests: Recent Labs  Lab 03/31/19 0323  AST 25  ALT 31  ALKPHOS 66  BILITOT 0.7  PROT 6.5  ALBUMIN 3.2*   No results for input(s): LIPASE, AMYLASE in the last 168 hours. No results for input(s): AMMONIA in the last 168 hours. Coagulation Profile: No results for input(s): INR, PROTIME in the last 168 hours. Cardiac Enzymes: Recent Labs  Lab 03/30/19 0205 03/30/19 0810  TROPONINI <0.03 0.03*   BNP (last 3 results) No results for input(s): PROBNP in the last 8760 hours. HbA1C: No results for input(s): HGBA1C in the last 72 hours. CBG: No results for input(s): GLUCAP in the last 168 hours. Lipid Profile: Recent Labs    03/30/19 0810  CHOL 161  HDL 49  LDLCALC 86  TRIG 131  CHOLHDL 3.3   Thyroid Function Tests: Recent Labs    03/30/19 0810  TSH 3.720   Anemia Panel: Recent Labs    03/30/19 0759  FERRITIN 27  TIBC 332  IRON 35*   Urine analysis:    Component Value Date/Time   COLORURINE yellow 06/27/2008 1549   APPEARANCEUR Clear 06/27/2008 1549   LABSPEC 1.020 06/27/2008 1549   PHURINE 5.0 06/27/2008 1549   HGBUR negative 06/27/2008 1549   BILIRUBINUR negative 06/27/2008 1549   UROBILINOGEN 2.0 06/27/2008 1549   NITRITE negative 06/27/2008 1549   Sepsis Labs: @LABRCNTIP (procalcitonin:4,lacticidven:4)  ) Recent Results (from the past 240 hour(s))  SARS Coronavirus 2 (CEPHEID- Performed in [place] hospital lab), Hosp Order     Status: None   Collection Time: 03/23/19 10:21 AM  Result Value Ref Range Status   SARS Coronavirus 2 NEGATIVE NEGATIVE Final    Comment: (NOTE) If result is NEGATIVE SARS-CoV-2 target nucleic acids are NOT DETECTED. The SARS-CoV-2 RNA is generally detectable  in upper and lower  respiratory specimens during the acute phase of infection. The lowest  concentration of SARS-CoV-2 viral copies this assay can detect is 250  copies / mL. A negative result does not preclude SARS-CoV-2 infection  and should not be used as the sole basis for treatment or other  patient management decisions.  A negative result may occur with  improper specimen collection / handling, submission of specimen other  than nasopharyngeal swab, presence of viral mutation(s) within the  areas targeted by this assay, and inadequate number of viral copies  (<250 copies / mL). A negative result must be combined with clinical  observations, patient history, and epidemiological information. If result is POSITIVE SARS-CoV-2 target nucleic acids are DETECTED. The SARS-CoV-2 RNA is generally detectable in upper and lower  respiratory specimens dur ing the acute phase of infection.  Positive  results are indicative of active infection with SARS-CoV-2.  Clinical  correlation with patient history and other diagnostic information  is  necessary to determine patient infection status.  Positive results do  not rule out bacterial infection or co-infection with other viruses. If result is PRESUMPTIVE POSTIVE SARS-CoV-2 nucleic acids MAY BE PRESENT.   A presumptive positive result was obtained on the submitted specimen  and confirmed on repeat testing.  While 2019 novel coronavirus  (SARS-CoV-2) nucleic acids may be present in the submitted sample  additional confirmatory testing may be necessary for epidemiological  and / or clinical management purposes  to differentiate between  SARS-CoV-2 and other Sarbecovirus currently known to infect humans.  If clinically indicated additional testing with an alternate test  methodology [phone]) is advised. The SARS-CoV-2 RNA is generally  detectable in upper and lower respiratory sp ecimens during the acute  phase of infection. The expected result is  Negative. Fact Sheet for Patients:  StrictlyIdeas.no Fact Sheet for Healthcare Providers: BankingDealers.co.za This test is not yet approved or cleared by the [place] FDA and has been authorized for detection and/or diagnosis of SARS-CoV-2 by FDA under an Emergency Use Authorization (EUA).  This EUA will remain in effect (meaning this test can be used) for the duration of the COVID-19 declaration under Section 564(b)(1) of the Act, 21 U.S.C. section 360bbb-3(b)(1), unless the authorization is terminated or revoked sooner. Performed at [facility], [address] [place], [place] [address]   SARS Coronavirus 2 (CEPHEID - Performed in [facility] hospital lab), Hosp Order     Status: None   Collection Time: 03/30/19  3:05 AM  Result Value Ref Range Status   SARS Coronavirus 2 NEGATIVE NEGATIVE Final    Comment: (NOTE) If result is NEGATIVE SARS-CoV-2 target nucleic acids are NOT DETECTED. The SARS-CoV-2 RNA is generally detectable in upper and lower  respiratory specimens during the acute phase of infection. The lowest  concentration of SARS-CoV-2 viral copies this assay can detect is 250  copies / mL. A negative result does not preclude SARS-CoV-2 infection  and should not be used as the sole basis for treatment or other  patient management decisions.  A negative result may occur with  improper specimen collection / handling, submission of specimen other  than nasopharyngeal swab, presence of viral mutation(s) within the  areas targeted by this assay, and inadequate number of viral copies  (<250 copies / mL). A negative result must be combined with clinical  observations, patient history, and epidemiological information. If result is POSITIVE SARS-CoV-2 target nucleic acids are DETECTED. The SARS-CoV-2 RNA is generally detectable in upper and lower  respiratory specimens dur ing the acute phase of infection.  Positive  results  are indicative of active infection with SARS-CoV-2.  Clinical  correlation with patient history and other diagnostic information is  necessary to determine patient infection status.  Positive results do  not rule out bacterial infection or co-infection with other viruses. If result is PRESUMPTIVE POSTIVE SARS-CoV-2 nucleic acids MAY BE PRESENT.   A presumptive positive result was obtained on the submitted specimen  and confirmed on repeat testing.  While 2019 novel coronavirus  (SARS-CoV-2) nucleic acids may be present in the submitted sample  additional confirmatory testing may be necessary for epidemiological  and / or clinical management purposes  to differentiate between  SARS-CoV-2 and other Sarbecovirus currently known to infect humans.  If clinically indicated additional testing with an alternate test  methodology [phone]) is advised. The SARS-CoV-2 RNA is generally  detectable in upper and lower respiratory sp ecimens during the acute  phase of infection.  The expected result is Negative. Fact Sheet for Patients:  StrictlyIdeas.no Fact Sheet for Healthcare Providers: BankingDealers.co.za This test is not yet approved or cleared by the [place] FDA and has been authorized for detection and/or diagnosis of SARS-CoV-2 by FDA under an Emergency Use Authorization (EUA).  This EUA will remain in effect (meaning this test can be used) for the duration of the COVID-19 declaration under Section 564(b)(1) of the Act, 21 U.S.C. section 360bbb-3(b)(1), unless the authorization is terminated or revoked sooner. Performed at [facility], [address], [place], [place] [address]   Culture, blood (Routine X 2) w Reflex to ID Panel     Status: None (Preliminary result)   Collection Time: 03/30/19  7:55 AM  Result Value Ref Range Status   Specimen Description BLOOD BLOOD LEFT ARM  Final   Special Requests   Final    BOTTLES DRAWN AEROBIC  AND ANAEROBIC Blood Culture adequate volume   Culture   Final    NO GROWTH < 24 HOURS Performed at [facility], [address] [place], [place] [address]    Report Status PENDING  Incomplete  Culture, blood (Routine X 2) w Reflex to ID Panel     Status: None (Preliminary result)   Collection Time: 03/30/19  7:59 AM  Result Value Ref Range Status   Specimen Description BLOOD BLOOD RIGHT HAND  Final   Special Requests   Final    BOTTLES DRAWN AEROBIC AND ANAEROBIC Blood Culture adequate volume   Culture   Final    NO GROWTH < 24 HOURS Performed at [facility], [address] [place], [place] [address]    Report Status PENDING  Incomplete      Studies: US Abdomen Limited Ruq  Result Date: 03/30/2019 CLINICAL DATA:  Abnormal liver function tests. EXAM: ULTRASOUND ABDOMEN LIMITED RIGHT UPPER QUADRANT COMPARISON:  None. FINDINGS: Gallbladder: There is cholelithiasis without gallbladder wall thickening or pericholecystic free fluid. The sonographic Percell Miller sign is reported as negative. Common bile duct: Diameter: 0.6 cm tapering to 0.4 cm. Liver: Diffuse increased echogenicity with slightly heterogeneous liver. Appearance typically secondary to fatty infiltration. Fibrosis secondary consideration. No secondary findings of cirrhosis noted. No focal hepatic lesion or intrahepatic biliary duct dilatation. Portal vein is patent on color Doppler imaging with normal direction of blood flow towards the liver. IMPRESSION: 1. Cholelithiasis without sonographic evidence for acute cholecystitis. 2. Diffuse increased echogenicity with slightly heterogeneous liver. Appearance typically secondary to fatty infiltration. Fibrosis secondary consideration. No secondary findings of cirrhosis noted. No focal hepatic lesion or intrahepatic biliary duct dilatation. Electronically Signed   By: Constance Holster M.D.   On: 03/30/2019 18:55    Scheduled Meds: . allopurinol  300 mg Oral Daily  . amLODipine  5 mg Oral  Daily  . enoxaparin (LOVENOX) injection  70 mg Subcutaneous Q24H  . furosemide  20 mg Intravenous Daily  . gabapentin  300 mg Oral Daily  . lisinopril  20 mg Oral Daily  . metFORMIN  1,000 mg Oral BID WC  . pravastatin  40 mg Oral Daily  . sodium chloride flush  3 mL Intravenous Q12H    Continuous Infusions: . sodium chloride    . sodium chloride 500 mL (03/30/19 0907)  . ceFEPime (MAXIPIME) IV Stopped (03/31/19 1025)  . vancomycin 1,250 mg (03/31/19 0252)     LOS: 0 days     Kayleen Memos, MD [facility] Pager [phone]  If 7PM-7AM, please contact night-coverage [email or web address] Password [facility] 03/31/2019, 12:25  PM

## 2019-03-31 NOTE — Consult Note (Signed)
Cardiology Consultation:   Patient ID: BERYLE ZEITZ; [id number]; May 03, 1961   Admit date: 03/30/2019 Date of Consult: 03/31/2019  Primary Care Provider: Celene Squibb, MD Primary Cardiologist: Minus Breeding, MD   New Primary Electrophysiologist:  None   Patient Profile:   Roberto Guerra is a 58 y.o. male with a hx of HTN and DM who is being seen today for the evaluation of reduced EF  at the request of Dr. Nevada Crane.  History of Present Illness:   Roberto Guerra has no past cardiac history.  He has had increased dyspnea for about a week and was treated for a pneumonia.  He felt a little bit better but developed worsening SOB on Friday.  Now he is found to have a reduced EF with results as below.  The patient denies any new symptoms such as chest discomfort, neck or arm discomfort. There have been no reported palpitations, presyncope or syncope.   He does have PND.  He has had mild weight gain.  He has been treated with Lasix although his I/Os are incomplete.  He feels better. In retrospect he was drinking lots of tomato juice.  He does not really watch his diet and has morbid obesity.  He is active walking as a Librarian, academic.  He was not getting symptoms prior to one week ago.  He denies cough, fever or chills.  He dose not snore.     Past Medical History:  Diagnosis Date  . Diabetes mellitus without complication ([place])   . Gout   . Hyperlipidemia   . Hypertension   . PNA (pneumonia)     Past Surgical History:  Procedure Laterality Date  . CATARACT EXTRACTION W/PHACO Left 10/29/2018   Procedure: CATARACT EXTRACTION PHACO AND INTRAOCULAR LENS PLACEMENT (IOC);  Surgeon: Tonny Branch, MD;  Location: [facility];  Service: Ophthalmology;  Laterality: Left;  CDE: 43.37  . COLONOSCOPY N/A 02/27/2014   Procedure: COLONOSCOPY;  Surgeon: Rogene Houston, MD;  Location: [facility] SUITE;  Service: Endoscopy;  Laterality: N/A;  830  . NO PAST SURGERIES      Prior to Admission medications   Medication Sig  Start Date End Date Taking? Authorizing Provider  allopurinol (ZYLOPRIM) 300 MG tablet Take 300 mg by mouth daily.     Yes [provider]  amoxicillin (AMOXIL) 500 MG capsule Take 2 capsules (1,000 mg total) by mouth 3 (three) times daily. 03/23/19  Yes Nat Christen, MD  azithromycin [facility]) 250 MG tablet 1 tab daily for 4 days starting Sunday afternoon 03/23/19  Yes Nat Christen, MD  gabapentin (NEURONTIN) 300 MG capsule Take 300 mg by mouth daily.   Yes [provider]  HYDROcodone-acetaminophen (NORCO/VICODIN) 5-325 MG tablet Take 1 tablet by mouth every 6 (six) hours as needed for severe pain.   Yes [provider]  lisinopril-hydrochlorothiazide (PRINZIDE,ZESTORETIC) 20-12.5 MG per tablet Take 1 tablet by mouth daily.     Yes [provider]  meloxicam (MOBIC) 15 MG tablet Take 15 mg by mouth daily.   Yes [provider]  metFORMIN (GLUCOPHAGE) 1000 MG tablet Take 1,000 mg by mouth 2 (two) times daily with a meal.   Yes [provider]  pravastatin (PRAVACHOL) 40 MG tablet Take 40 mg by mouth daily.    Yes [provider]  predniSONE (DELTASONE) 50 MG tablet One tablet po daily for 5 days Patient not taking: Reported on 03/30/2019 11/18/18   Ripley Fraise, MD     Inpatient Medications: Scheduled  Meds: . allopurinol  300 mg Oral Daily  . amLODipine  5 mg Oral Daily  . enoxaparin (LOVENOX) injection  70 mg Subcutaneous Q24H  . furosemide  20 mg Intravenous Daily  . gabapentin  300 mg Oral Daily  . lisinopril  20 mg Oral Daily  . metFORMIN  1,000 mg Oral BID WC  . pravastatin  40 mg Oral Daily  . sodium chloride flush  3 mL Intravenous Q12H   Continuous Infusions: . sodium chloride    . sodium chloride 500 mL (03/30/19 0907)  . ceFEPime (MAXIPIME) IV Stopped (03/31/19 1025)  . vancomycin 1,250 mg (03/31/19 0252)   PRN Meds: sodium chloride, sodium chloride, hydrALAZINE, HYDROcodone-acetaminophen, sodium chloride  flush  Allergies:   No Known Allergies  Social History:   Social History   Socioeconomic History  . Marital status: Married    Spouse name: Not on file  . Number of children: Not on file  . Years of education: 46  . Highest education level: Not on file  Occupational History    Employer: [employer]  Social Needs  . Financial resource strain: Not on file  . Food insecurity:    Worry: Not on file    Inability: Not on file  . Transportation needs:    Medical: Not on file    Non-medical: Not on file  Tobacco Use  . Smoking status: Former Smoker    Packs/day: 0.25    Years: 1.00    Pack years: 0.25    Types: Cigarettes  . Smokeless tobacco: Never Used  Substance and Sexual Activity  . Alcohol use: No  . Drug use: No  . Sexual activity: Not on file  Lifestyle  . Physical activity:    Days per week: Not on file    Minutes per session: Not on file  . Stress: Not on file  Relationships  . Social connections:    Talks on phone: Not on file    Gets together: Not on file    Attends religious service: Not on file    Active member of club or organization: Not on file    Attends meetings of clubs or organizations: Not on file    Relationship status: Not on file  . Intimate partner violence:    Fear of current or ex partner: Not on file    Emotionally abused: Not on file    Physically abused: Not on file    Forced sexual activity: Not on file  Other Topics Concern  . Not on file  Social History Narrative  . Not on file    Family History:    Family History  Problem Relation Age of Onset  . Diabetes Other   . Colon cancer Neg Hx      ROS:  Please see the history of present illness.  As stated in the HPI and negative for all other systems.   Physical Exam/Data:   Vitals:   03/31/19 0410 03/31/19 0754 03/31/19 1146 03/31/19 1151  BP: 135/89 (!) 130/97 (!) 149/100 (!) 141/88  Pulse: (!) 102 (!) 102 89 (!) 108  Resp: 20 17 18    Temp: 98 F (36.7 C) 98 F  (36.7 C) 98.2 F (36.8 C)   TempSrc: Oral Oral Oral   SpO2: 95% 100% 99% 98%  Weight:      Height:        Intake/Output Summary (Last 24 hours) at 03/31/2019 1244 Last data filed at 03/31/2019 1025 Gross per 24 hour  Intake 1466.22 ml  Output 865 ml  Net 601.22 ml   Filed Weights   03/30/19 0156 03/31/19 0408  Weight: (!) 137 kg 135.2 kg   Body mass index is 48.11 kg/m.  GENERAL:  Well appearing HEENT:   Pupils equal round and reactive, fundi not visualized, oral mucosa unremarkable NECK:  No  jugular venous distention, waveform within normal limits, carotid upstroke brisk and symmetric, no bruits, no thyromegaly LYMPHATICS:  No cervical, inguinal adenopathy LUNGS:   Clear to auscultation bilaterally BACK:  No CVA tenderness CHEST:   Unremarkable HEART:  PMI not displaced or sustained,S1 and S2 within normal limits, no S3, no S4, no clicks, no rubs, no murmurs ABD:  Obese, positive bowel sounds normal in frequency in pitch, no bruits, no rebound, no guarding, no midline pulsatile mass, no hepatomegaly, no splenomegaly EXT:  2 plus pulses throughout, no  edema, no cyanosis no clubbing SKIN:  No rashes no nodules NEURO:   Cranial nerves II through XII grossly intact, motor grossly intact throughout PSYCH:    Cognitively intact, oriented to person place and time, mild edema   EKG:  The EKG was personally reviewed and demonstrates:  ST, rate 172, PVCs, voltage criteria for LVH.  No acute ST T wave changes.      ECHO:  03/30/19   1. The left ventricle has mild-moderately reduced systolic function, with an ejection fraction of 40-45%. The cavity size was normal. There is mild concentric left ventricular hypertrophy. Left ventricular diastolic Doppler parameters are consistent  with pseudonormalization. Elevated mean left atrial pressure.  2. The right ventricle has normal systolic function. The cavity was normal. There is no increase in right ventricular wall thickness. Right  ventricular systolic pressure could not be assessed.  3. There is mild mitral annular calcification present.  4. The tricuspid valve is grossly normal.   Relevant CV Studies:   There was no ST segment deviation noted during stress.  No T wave inversion was noted during stress.  Defect 1: There is a medium defect of mild severity present in the basal inferior location.  Defect 2: There is a small defect of mild severity present in the apex location.  This is a high risk study.  The left ventricular ejection fraction is severely decreased (<30%).  Nuclear stress EF: 25%.   High risk nuclear perfusion study due to severely depressed left ventricular systolic function, without reversible ischemic perfusion defects.    Laboratory Data:  Chemistry Recent Labs  Lab 03/30/19 0205 03/31/19 0323  NA 141 140  K 3.8 3.7  CL 108 105  CO2 22 24  GLUCOSE 137* 120*  BUN 13 12  CREATININE 0.91 1.08  CALCIUM 9.6 9.3  GFRNONAA >60 >60  GFRAA >60 >60  ANIONGAP 11 11    Recent Labs  Lab 03/31/19 0323  PROT 6.5  ALBUMIN 3.2*  AST 25  ALT 31  ALKPHOS 66  BILITOT 0.7   Hematology Recent Labs  Lab 03/30/19 0205 03/31/19 0323  WBC 8.1 9.1  RBC 4.92 4.75  HGB 13.9 13.4  HCT 45.2 43.1  MCV 91.9 90.7  MCH 28.3 28.2  MCHC 30.8 31.1  RDW 14.2 13.9  PLT 167 163   Cardiac Enzymes Recent Labs  Lab 03/30/19 0205 03/30/19 0810  TROPONINI <0.03 0.03*   No results for input(s): TROPIPOC in the last 168 hours.  BNP Recent Labs  Lab 03/30/19 0205  BNP 79.0    DDimer  Recent Labs  Lab 03/30/19  0205  DDIMER 0.28    Radiology/Studies:  Dg Chest Port 1 View  Result Date: 03/30/2019 CLINICAL DATA:  Increasing shortness of breath EXAM: PORTABLE CHEST 1 VIEW COMPARISON:  CT from 03/23/2019 FINDINGS: Cardiac shadow is stable. Mild increased density is again noted in the bases bilaterally consistent with small effusions as well as the previously seen infiltrates. No new  focal abnormality is noted. IMPRESSION: Stable appearance of effusions and mild infiltrates. Electronically Signed   By: Inez Catalina M.D.   On: 03/30/2019 03:10   US Abdomen Limited Ruq  Result Date: 03/30/2019 CLINICAL DATA:  Abnormal liver function tests. EXAM: ULTRASOUND ABDOMEN LIMITED RIGHT UPPER QUADRANT COMPARISON:  None. FINDINGS: Gallbladder: There is cholelithiasis without gallbladder wall thickening or pericholecystic free fluid. The sonographic Percell Miller sign is reported as negative. Common bile duct: Diameter: 0.6 cm tapering to 0.4 cm. Liver: Diffuse increased echogenicity with slightly heterogeneous liver. Appearance typically secondary to fatty infiltration. Fibrosis secondary consideration. No secondary findings of cirrhosis noted. No focal hepatic lesion or intrahepatic biliary duct dilatation. Portal vein is patent on color Doppler imaging with normal direction of blood flow towards the liver. IMPRESSION: 1. Cholelithiasis without sonographic evidence for acute cholecystitis. 2. Diffuse increased echogenicity with slightly heterogeneous liver. Appearance typically secondary to fatty infiltration. Fibrosis secondary consideration. No secondary findings of cirrhosis noted. No focal hepatic lesion or intrahepatic biliary duct dilatation. Electronically Signed   By: Constance Holster M.D.   On: 03/30/2019 18:55    Assessment and Plan:   ACUTE SYSTOLIC/DIASTOLIC HF:    EF reduced.    Two day stress test results with no obvious ischemia.  I suspect a non ischemic etiology and I am not considering a cardiac cath at this point.  I had a long discussion with him about salt and fluid.  I will stop the HCTZ and start Coreg.  I considered Entresto but he knows this drug and could not afford this.  Continue ACE inhibitor.  I will stop the Norvasc.  Discharge on Lasix 40 mg daily PO.   Would send home on 10 meq KDur.     HTN:   This is being managed in the context of treating his CHF  DM:     A1C  6.6.  Continue current therapy.    OBESITY:  We talked about weight loss with diet and exercise.      For questions or updates, please contact [place] Please consult [email or web address] for contact info under Cardiology/STEMI.   Signed, Minus Breeding, MD  03/31/2019 12:44 PM

## 2019-04-01 DIAGNOSIS — I5021 Acute systolic (congestive) heart failure: Secondary | ICD-10-CM

## 2019-04-01 LAB — BASIC METABOLIC PANEL
Anion gap: 9 (ref 5–15)
BUN: 13 mg/dL (ref 6–20)
CO2: 24 mmol/L (ref 22–32)
Calcium: 9.4 mg/dL (ref 8.9–10.3)
Chloride: 107 mmol/L (ref 98–111)
Creatinine, Ser: 0.92 mg/dL (ref 0.61–1.24)
GFR calc Af Amer: >60 mL/min (ref >60)
GFR calc non Af Amer: >60 mL/min (ref >60)
Glucose, Bld: 115 mg/dL — ABNORMAL HIGH (ref 70–99)
Potassium: 4.2 mmol/L (ref 3.5–5.1)
Sodium: 140 mmol/L (ref 135–145)

## 2019-04-01 LAB — PROCALCITONIN: Procalcitonin: <0.1 ng/mL

## 2019-04-01 LAB — CBC
HCT: 43.1 % (ref 39.0–52.0)
Hemoglobin: 13.4 g/dL (ref 13.0–17.0)
MCH: 28.3 pg (ref 26.0–34.0)
MCHC: 31.1 g/dL (ref 30.0–36.0)
MCV: 90.9 fL (ref 80.0–100.0)
Platelets: 156 10*3/uL (ref 150–400)
RBC: 4.74 MIL/uL (ref 4.22–5.81)
RDW: 14 % (ref 11.5–15.5)
WBC: 8.6 10*3/uL (ref 4.0–10.5)
nRBC: 0 % (ref 0.0–0.2)

## 2019-04-01 LAB — MRSA PCR SCREENING: MRSA by PCR: NEGATIVE

## 2019-04-01 MED ORDER — POTASSIUM CHLORIDE ER 10 MEQ PO TBCR: 10.0000 meq | EXTENDED_RELEASE_TABLET | Freq: Every day | ORAL | 0 refills | Status: DC

## 2019-04-01 MED ORDER — LISINOPRIL 20 MG PO TABS: 20.0000 mg | ORAL_TABLET | Freq: Every day | ORAL | 0 refills | Status: DC

## 2019-04-01 MED ORDER — POTASSIUM CHLORIDE CRYS ER 10 MEQ PO TBCR
10.0000 meq | EXTENDED_RELEASE_TABLET | Freq: Once | ORAL | Status: AC
  Administered 2019-04-01: 10 meq via ORAL
  Filled 2019-04-01: qty 1

## 2019-04-01 MED ORDER — CARVEDILOL 6.25 MG PO TABS: 6.2500 mg | ORAL_TABLET | Freq: Two times a day (BID) | ORAL | 0 refills | Status: DC

## 2019-04-01 MED ORDER — FUROSEMIDE 40 MG PO TABS
40.0000 mg | ORAL_TABLET | Freq: Every day | ORAL | Status: DC
  Administered 2019-04-01: 40 mg via ORAL
  Filled 2019-04-01: qty 1

## 2019-04-01 MED ORDER — FUROSEMIDE 40 MG PO TABS: 40.0000 mg | ORAL_TABLET | Freq: Every day | ORAL | 0 refills | Status: DC

## 2019-04-01 NOTE — Progress Notes (Signed)
RN called pharmacy and spoke with Maudie Mercury about rescheduling pt. Vancomycin IV antibiotic. Morning dose given late, due to lost of IV access.  See MAR for start time.

## 2019-04-01 NOTE — Progress Notes (Signed)
Progress Note  Patient Name: Roberto Guerra Date of Encounter: 04/01/2019  Primary Cardiologist: new. Dr. Percival Spanish  Subjective   Feels great. Breathing back to normal. No edema. No chest pain.  Inpatient Medications    Scheduled Meds: . allopurinol  300 mg Oral Daily  . carvedilol  6.25 mg Oral BID WC  . enoxaparin (LOVENOX) injection  70 mg Subcutaneous Q24H  . furosemide  20 mg Intravenous Daily  . gabapentin  300 mg Oral Daily  . lisinopril  20 mg Oral Daily  . metFORMIN  1,000 mg Oral BID WC  . pravastatin  40 mg Oral Daily  . sodium chloride flush  3 mL Intravenous Q12H   Continuous Infusions: . sodium chloride    . sodium chloride 250 mL (04/01/19 0620)  . ceFEPime (MAXIPIME) IV 2 g (04/01/19 0136)  . vancomycin 1,250 mg (04/01/19 0621)   PRN Meds: sodium chloride, sodium chloride, hydrALAZINE, HYDROcodone-acetaminophen, sodium chloride flush   Vital Signs    Vitals:   03/31/19 1624 03/31/19 2009 04/01/19 0543 04/01/19 0544  BP: 132/90 (!) 144/96 131/82   Pulse: (!) 51 (!) 53 (!) 102   Resp: 18 18 18    Temp: 98.3 F (36.8 C) 98 F (36.7 C) 98.4 F (36.9 C)   TempSrc: Oral Oral Oral   SpO2: 95% 97% 95%   Weight:    135.5 kg  Height:        Intake/Output Summary (Last 24 hours) at 04/01/2019 0737 Last data filed at 04/01/2019 0552 Gross per 24 hour  Intake 1907.73 ml  Output 700 ml  Net 1207.73 ml   Last 3 Weights 04/01/2019 03/31/2019 03/30/2019  Weight (lbs) 298 lb 12.8 oz 298 lb 1.6 oz 302 lb 0.5 oz  Weight (kg) 135.535 kg 135.217 kg 137 kg      Telemetry    NSR with occ PVC - Personally Reviewed  ECG    03/30/19: sinus tachy with PVCs. Nonspecific ST abnormality. - Personally Reviewed  Physical Exam   GEN: No acute distress. obese  Neck: No JVD Cardiac: RRR, no murmurs, rubs, or gallops.  Respiratory: Clear to auscultation bilaterally. GI: Soft, nontender, non-distended  MS: No edema; No deformity. Neuro:  Nonfocal  Psych: Normal  affect   Labs    Chemistry Recent Labs  Lab 03/30/19 0205 03/31/19 0323 04/01/19 0612  NA 141 140 140  K 3.8 3.7 4.2  CL 108 105 107  CO2 22 24 24   GLUCOSE 137* 120* 115*  BUN 13 12 13   CREATININE 0.91 1.08 0.92  CALCIUM 9.6 9.3 9.4  PROT  --  6.5  --   ALBUMIN  --  3.2*  --   AST  --  25  --   ALT  --  31  --   ALKPHOS  --  66  --   BILITOT  --  0.7  --   GFRNONAA >60 >60 >60  GFRAA >60 >60 >60  ANIONGAP 11 11 9      Hematology Recent Labs  Lab 03/30/19 0205 03/31/19 0323 04/01/19 0612  WBC 8.1 9.1 8.6  RBC 4.92 4.75 4.74  HGB 13.9 13.4 13.4  HCT 45.2 43.1 43.1  MCV 91.9 90.7 90.9  MCH 28.3 28.2 28.3  MCHC 30.8 31.1 31.1  RDW 14.2 13.9 14.0  PLT 167 163 156    Cardiac Enzymes Recent Labs  Lab 03/30/19 0205 03/30/19 0810  TROPONINI <0.03 0.03*   No results for input(s): TROPIPOC in the last  168 hours.   BNP Recent Labs  Lab 03/30/19 0205  BNP 79.0     DDimer  Recent Labs  Lab 03/30/19 0205  DDIMER 0.28     Radiology    Nm Myocar Multi W/spect W/wall Motion / Ef  Result Date: 03/31/2019  There was no ST segment deviation noted during stress.  No T wave inversion was noted during stress.  Defect 1: There is a medium defect of mild severity present in the basal inferior location.  Defect 2: There is a small defect of mild severity present in the apex location.  This is a high risk study.  The left ventricular ejection fraction is severely decreased (<30%).  Nuclear stress EF: 25%.  High risk nuclear perfusion study due to severely depressed left ventricular systolic function, without reversible ischemic perfusion defects. 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   US Abdomen Limited Ruq  Result Date: 03/30/2019 CLINICAL DATA:  Abnormal liver function tests. EXAM: ULTRASOUND ABDOMEN LIMITED RIGHT UPPER QUADRANT COMPARISON:  None. FINDINGS:  Gallbladder: There is cholelithiasis without gallbladder wall thickening or pericholecystic free fluid. The sonographic Percell Miller sign is reported as negative. Common bile duct: Diameter: 0.6 cm tapering to 0.4 cm. Liver: Diffuse increased echogenicity with slightly heterogeneous liver. Appearance typically secondary to fatty infiltration. Fibrosis secondary consideration. No secondary findings of cirrhosis noted. No focal hepatic lesion or intrahepatic biliary duct dilatation. Portal vein is patent on color Doppler imaging with normal direction of blood flow towards the liver. IMPRESSION: 1. Cholelithiasis without sonographic evidence for acute cholecystitis. 2. Diffuse increased echogenicity with slightly heterogeneous liver. Appearance typically secondary to fatty infiltration. Fibrosis secondary consideration. No secondary findings of cirrhosis noted. No focal hepatic lesion or intrahepatic biliary duct dilatation. Electronically Signed   By: Constance Holster M.D.   On: 03/30/2019 18:55    Cardiac Studies   ECHO:  03/30/19  1. The left ventricle has mild-moderately reduced systolic function, with an ejection fraction of 40-45%. The cavity size was normal. There is mild concentric left ventricular hypertrophy. Left ventricular diastolic Doppler parameters are consistent  with pseudonormalization. Elevated mean left atrial pressure. 2. The right ventricle has normal systolic function. The cavity was normal. There is no increase in right ventricular wall thickness. Right ventricular systolic pressure could not be assessed. 3. There is mild mitral annular calcification present. 4. The tricuspid valve is grossly normal.   Relevant CV Studies:   There was no ST segment deviation noted during stress.  No T wave inversion was noted during stress.  Defect 1: There is a medium defect of mild severity present in the basal inferior location.  Defect 2: There is a small defect of mild severity  present in the apex location.  This is a high risk study.  The left ventricular ejection fraction is severely decreased (<30%).  Nuclear stress EF: 25%.  High risk nuclear perfusion study due to severely depressed left ventricular systolic function, without reversible ischemic perfusion defects.    Patient Profile     58 y.o. male  with a hx of HTN and DM who is being seen today for the evaluation of reduced EF  at the request of Dr. Nevada Crane.  Assessment & Plan    1. ACUTE SYSTOLIC/DIASTOLIC HF:    EF reduced.    Two day stress test results with no obvious ischemia.  I suspect a non ischemic etiology.  Reinforced need for sodium and fluid restriction.  Now on lasix, Coreg, lisinopril.  Considered Entresto but he knows this drug and could not afford this.  Stop the Norvasc.  Discharge on Lasix 40 mg daily PO.   Would send home on 10 meq KDur.  consider addition of Bidil as outpatient.   2. HTN:   This is being managed in the context of treating his CHF. Again if BP allows will consider Bidili as outpatient.   3. DM:     A1C 6.6.  Continue current therapy.    4. OBESITY:  We talked about weight loss with diet and exercise.      [facility] will sign off.   Medication Recommendations:  As noted in [facility] Other recommendations (labs, testing, etc):  Will need BMET in follow up Follow up as an outpatient:  With Dr Percival Spanish in 2 weeks.  For questions or updates, please contact [place] Please consult [email or web address] for contact info under        Signed, Elizzie Westergard [place], MD  04/01/2019, 7:37 AM

## 2019-04-01 NOTE — Progress Notes (Signed)
Patient to be discharged to home today. All personal belongings with patient.  Patient displays no sign or symptom of distress.   All discharge information reviewed with patient and emphasis placed on keeping follow up appointments.

## 2019-04-01 NOTE — Discharge Summary (Signed)
Physician Discharge Summary  Roberto Guerra ELF:[id number] DOB: 1961/09/27 DOA: 03/30/2019  PCP: Celene Squibb, MD  Admit date: 03/30/2019 Discharge date: 04/01/2019  Time spent: 40 minutes  Recommendations for Outpatient Follow-up:  1. Follow up outpatient CBC/CMP - needs BMP within 2 weeks 2. Started on lasix, coreg, lisinopril, potassium at discharge 3. Needs outpatient cards follow up  4. Consider starting bidil as outpatient if BP allows 5. Follow outpatient for fatty liver 6. Outpatient CXR   Discharge Diagnoses:  Principal Problem:   Dyspnea Active Problems:   Essential hypertension   Tachycardia   Discharge Condition: stable  Diet recommendation: heart healthy   Filed Weights   03/30/19 0156 03/31/19 0408 04/01/19 0544  Weight: (!) 137 kg 135.2 kg 135.5 kg    History of present illness:  RichardJonesis a57 y.o.male,w hypertension, hyperlipidemia, Dm2, neuropathy apparently recently tx for pneumonnia with zithromax, apparently presents with c/o worsening dyspnea last nite. Pt notes orthopnea, pt has had slight leg swelling but this has actually improved. Pt denies weight gain, fever, chills, cough, cp, palp, n/v, diarrhea, brbpr, black stool, dysuria, hematuria.  Admitted for chest pain rule out ACS and HCAP.  He was admitted for CP and concern for pneumonia.  ACS was ruled out.  He was found to have decreased EF and HF.  Medications were adjusted by cardiology.  He had Roberto Guerra stress test without obvious ischemia and no plans for cath per cardiology.   Hospital Course:  Chest pain rule out ACS Troponin negative x3 No specific ST-T changes on EKG S/p nuclear stress test on 03/30/2019 - no obvious ischemia per cards CTA negative for PE 2D echo done on 03/30/2019 showed LVEF 40 to 45% Continue to closely monitor on telemetry  Newly diagnosed acute systolic CHF 2D echo done on 03/30/2019 showed LVEF 40 to 45% On IV Lasix 20 mg daily Start strict I's and O's and  daily weight Cardiology consulted -> recommended lasix/coreg/lisinopril and potassium at discharge.  Non ischemic etiology suspected per cards. Plan for outpatient cards follow up.  HCAP Pt afebrile, on RA, normal WBC  PCT <0.10 Will stop abx Follow outpatient CXR  Elevated ALT, resolved  Abdominal ultrasound showed cholelithiasis with no evidence of acute cholecystitis and fatty infiltration of the liver Follow fatting liver outpatient   Morbid obesity We will recommend weight loss outpatient  Procedures: Echo IMPRESSIONS    1. The left ventricle has mild-moderately reduced systolic function, with an ejection fraction of 40-45%. The cavity size was normal. There is mild concentric left ventricular hypertrophy. Left ventricular diastolic Doppler parameters are consistent  with pseudonormalization. Elevated mean left atrial pressure.  2. The right ventricle has normal systolic function. The cavity was normal. There is no increase in right ventricular wall thickness. Right ventricular systolic pressure could not be assessed.  3. There is mild mitral annular calcification present.  4. The tricuspid valve is grossly normal.  LE US IMPRESSION: No evidence of deep venous thrombosis.   There was no ST segment deviation noted during stress.  No T wave inversion was noted during stress.  Defect 1: There is Roberto Guerra medium defect of mild severity present in the basal inferior location.  Defect 2: There is Roberto Guerra small defect of mild severity present in the apex location.  This is Roberto Guerra high risk study.  The left ventricular ejection fraction is severely decreased (<30%).  Nuclear stress EF: 25%.   High risk nuclear perfusion study due to severely depressed left ventricular systolic function,  without reversible ischemic perfusion defects. The findings are mostly compatible with nonischemic cardiomyopathy, but cannot entirely exclude severe CAD with "balanced  ischemia".  Consultations:  cardiology  Discharge Exam: Vitals:   03/31/19 2009 04/01/19 0543  BP: (!) 144/96 131/82  Pulse: (!) 53 (!) 102  Resp: 18 18  Temp: 98 F (36.7 C) 98.4 F (36.9 C)  SpO2: 97% 95%   Feels well Ready to go Discussed f/u plan and discharge meds  General: No acute distress. Cardiovascular: Heart sounds show Roberto Guerra regular rate, and rhythm Lungs: Clear to auscultation bilaterally  Abdomen: Soft, nontender, nondistended  Neurological: Alert and oriented 3. Moves all extremities 4 . Cranial nerves II through XII grossly intact. Skin: Warm and dry. No rashes or lesions. Extremities: No clubbing or cyanosis. No edema.  Psychiatric: Mood and affect are normal. Insight and judgment are appropriate.  Discharge Instructions   Discharge Instructions    Call MD for:  difficulty breathing, headache or visual disturbances   Complete by:  As directed    Call MD for:  extreme fatigue   Complete by:  As directed    Call MD for:  hives   Complete by:  As directed    Call MD for:  persistant dizziness or light-headedness   Complete by:  As directed    Call MD for:  persistant nausea and vomiting   Complete by:  As directed    Call MD for:  redness, tenderness, or signs of infection (pain, swelling, redness, odor or green/yellow discharge around incision site)   Complete by:  As directed    Call MD for:  severe uncontrolled pain   Complete by:  As directed    Call MD for:  temperature >100.4   Complete by:  As directed    Diet - low sodium heart healthy   Complete by:  As directed    Discharge instructions   Complete by:  As directed    You were seen for heart failure.  You had Roberto Guerra stress test that did not show obvious signs of ischemia.    You're being started on lasix, coreg, and lisinopril at discharge.  We've also started you on potassium.  Stop your combination HCTZ-lisinopril.  You had fatty liver disease.  Please follow this up with your PCP.  You  were seen for pneumonia as well, but seem to have improved.  We will discharge you off antibiotics.  Please follow up with your PCP as an outpatient for repeat chest x ray.  Return for fevers or new worsening symptoms.  Please ask your PCP to request records from this hospitalization so they know what was done and what the next steps will be.   Increase activity slowly   Complete by:  As directed      Allergies as of 04/01/2019   No Known Allergies     Medication List    STOP taking these medications   amoxicillin 500 MG capsule Commonly known as:  AMOXIL   azithromycin 250 MG tablet Commonly known as:  Zithromax   lisinopril-hydrochlorothiazide 20-12.5 MG tablet Commonly known as:  ZESTORETIC   predniSONE 50 MG tablet Commonly known as:  DELTASONE     TAKE these medications   allopurinol 300 MG tablet Commonly known as:  ZYLOPRIM Take 300 mg by mouth daily.   carvedilol 6.25 MG tablet Commonly known as:  COREG Take 1 tablet (6.25 mg total) by mouth 2 (two) times daily with Darnelle Derrick meal for 30 days.  furosemide 40 MG tablet Commonly known as:  LASIX Take 1 tablet (40 mg total) by mouth daily for 30 days.   gabapentin 300 MG capsule Commonly known as:  NEURONTIN Take 300 mg by mouth daily.   HYDROcodone-acetaminophen 5-325 MG tablet Commonly known as:  NORCO/VICODIN Take 1 tablet by mouth every 6 (six) hours as needed for severe pain.   lisinopril 20 MG tablet Commonly known as:  ZESTRIL Take 1 tablet (20 mg total) by mouth daily for 30 days.   meloxicam 15 MG tablet Commonly known as:  MOBIC Take 15 mg by mouth daily.   metFORMIN 1000 MG tablet Commonly known as:  GLUCOPHAGE Take 1,000 mg by mouth 2 (two) times daily with Jocelyn Nold meal.   potassium chloride 10 MEQ tablet Commonly known as:  K-DUR Take 1 tablet (10 mEq total) by mouth daily for 30 days.   pravastatin 40 MG tablet Commonly known as:  PRAVACHOL Take 40 mg by mouth daily.      No Known  Allergies Follow-up Information    Satira Sark, MD Follow up.   Specialty:  Cardiology Why:  We will arrange for Lashara Urey follow-up with one of our providers in our [place] office and contact you. Contact information: [place] [address] [phone]        Celene Squibb, MD.   Specialty:  Internal Medicine Contact information: [place] [address] [phone]            The results of significant diagnostics from this hospitalization (including imaging, microbiology, ancillary and laboratory) are listed below for reference.    Significant Diagnostic Studies: Ct Angio Chest Pe W And/or Wo Contrast  Result Date: 03/23/2019 CLINICAL DATA:  Dry cough since Wednesday. Swelling in legs. Chest pain today after waking. PE suspected, high pretest probability. EXAM: CT ANGIOGRAPHY CHEST WITH CONTRAST TECHNIQUE: Multidetector CT imaging of the chest was performed using the standard protocol during bolus administration of intravenous contrast. Multiplanar CT image reconstructions and MIPs were obtained to evaluate the vascular anatomy. CONTRAST:  130mL OMNIPAQUE IOHEXOL 350 MG/ML SOLN COMPARISON:  One-view chest x-ray 03/23/2019 FINDINGS: Cardiovascular: Heart size is normal. Pulmonary artery opacification is excellent. There are no focal filling defects to suggest pulmonary embolus. Aortic arch is within normal limits. Mediastinum/Nodes: Prominent lymphoid tissue is present at the hila bilaterally, right greater than left. Right paratracheal lymph node measures 1.7 cm. No significant hilar adenopathy is present. Again lead is within normal limits. Esophagus is unremarkable. Lungs/Pleura: Patchy ground-glass attenuation is present throughout both lungs. Airspace disease most prevalent at the lung bases, right greater than left. Scattered airspace disease is present in the upper and lower lobes. Bilateral pleural effusions are noted. Upper Abdomen: There  is diffuse fatty infiltration of the liver. Focal calcification likely represents prior granulomatous disease. Limited imaging of the upper abdomen is otherwise unremarkable. Musculoskeletal: Vertebral body heights alignment are maintained. No focal lytic or blastic lesions are present. Sternum is intact. Ribs are within normal limits. Review of the MIP images confirms the above findings. IMPRESSION: 1. Diffuse bilateral patchy airspace disease bilaterally. This likely represents combination of edema and airspace disease, right greater than left. There is some basilar prominence. Findings are most consistent with multi lobar nonspecific pneumonia. Edema is considered less likely. 2. Bilateral hilar adenopathy also supports infectious or inflammatory process. 3. Bilateral pleural effusions. Electronically Signed   By: San Morelle M.D.   On: 03/23/2019 13:31   Nm  Myocar Multi W/spect W/wall Motion / Ef  Result Date: 03/31/2019  There was no ST segment deviation noted during stress.  No T wave inversion was noted during stress.  Defect 1: There is Ivanka Kirshner medium defect of mild severity present in the basal inferior location.  Defect 2: There is Filmore Molyneux small defect of mild severity present in the apex location.  This is Aalaiyah Yassin high risk study.  The left ventricular ejection fraction is severely decreased (<30%).  Nuclear stress EF: 25%.  High risk nuclear perfusion study due to severely depressed left ventricular systolic function, without reversible ischemic perfusion defects. 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   US Venous Img Lower Unilateral Right  Result Date: 03/23/2019 CLINICAL DATA:  Right leg edema. EXAM: Right LOWER EXTREMITY VENOUS DOPPLER ULTRASOUND TECHNIQUE: Gray-scale sonography with graded compression, as well as color Doppler and duplex ultrasound were performed to evaluate the lower extremity deep  venous systems from the level of the common femoral vein and including the common femoral, femoral, profunda femoral, popliteal and calf veins including the posterior tibial, peroneal and gastrocnemius veins when visible. The superficial great saphenous vein was also interrogated. Spectral Doppler was utilized to evaluate flow at rest and with distal augmentation maneuvers in the common femoral, femoral and popliteal veins. COMPARISON:  None. FINDINGS: Contralateral Common Femoral Vein: Respiratory phasicity is normal and symmetric with the symptomatic side. No evidence of thrombus. Normal compressibility. Common Femoral Vein: No evidence of thrombus. Normal compressibility, respiratory phasicity and response to augmentation. Saphenofemoral Junction: No evidence of thrombus. Normal compressibility and flow on color Doppler imaging. Profunda Femoral Vein: No evidence of thrombus. Normal compressibility and flow on color Doppler imaging. Femoral Vein: No evidence of thrombus. Normal compressibility, respiratory phasicity and response to augmentation. Popliteal Vein: No evidence of thrombus. Normal compressibility, respiratory phasicity and response to augmentation. Calf Veins: No evidence of thrombus. Normal compressibility and flow on color Doppler imaging. Superficial Great Saphenous Vein: No evidence of thrombus. Normal compressibility. Venous Reflux:  None. Other Findings:  None. IMPRESSION: No evidence of deep venous thrombosis. Electronically Signed   By: Kerby Moors M.D.   On: 03/23/2019 11:39   Dg Chest Port 1 View  Result Date: 03/30/2019 CLINICAL DATA:  Increasing shortness of breath EXAM: PORTABLE CHEST 1 VIEW COMPARISON:  CT from 03/23/2019 FINDINGS: Cardiac shadow is stable. Mild increased density is again noted in the bases bilaterally consistent with small effusions as well as the previously seen infiltrates. No new focal abnormality is noted. IMPRESSION: Stable appearance of effusions and mild  infiltrates. Electronically Signed   By: Inez Catalina M.D.   On: 03/30/2019 03:10   Dg Chest Port 1 View  Result Date: 03/23/2019 CLINICAL DATA:  Dry cough.  Leg swelling. EXAM: PORTABLE CHEST 1 VIEW COMPARISON:  February 19, 2017 FINDINGS: Heart size not well evaluated due to the portable technique. However, heart size appears borderline. No pneumothorax. No nodules or masses. Increased interstitial markings may represent mild edema versus an atypical infection. No focal infiltrate. IMPRESSION: 1. Increased interstitial opacities in the lungs suggesting pulmonary venous congestion/mild edema versus atypical infection. Adhya Cocco PA and lateral chest x-ray may better evaluate. Electronically Signed   By: Dorise Bullion III M.D   On: 03/23/2019 09:54   US Abdomen Limited Ruq  Result Date: 03/30/2019 CLINICAL DATA:  Abnormal liver function tests. EXAM: ULTRASOUND ABDOMEN LIMITED RIGHT UPPER QUADRANT COMPARISON:  None. FINDINGS: Gallbladder: There is cholelithiasis  without gallbladder wall thickening or pericholecystic free fluid. The sonographic Percell Miller sign is reported as negative. Common bile duct: Diameter: 0.6 cm tapering to 0.4 cm. Liver: Diffuse increased echogenicity with slightly heterogeneous liver. Appearance typically secondary to fatty infiltration. Fibrosis secondary consideration. No secondary findings of cirrhosis noted. No focal hepatic lesion or intrahepatic biliary duct dilatation. Portal vein is patent on color Doppler imaging with normal direction of blood flow towards the liver. IMPRESSION: 1. Cholelithiasis without sonographic evidence for acute cholecystitis. 2. Diffuse increased echogenicity with slightly heterogeneous liver. Appearance typically secondary to fatty infiltration. Fibrosis secondary consideration. No secondary findings of cirrhosis noted. No focal hepatic lesion or intrahepatic biliary duct dilatation. Electronically Signed   By: Constance Holster M.D.   On: 03/30/2019 18:55     Microbiology: Recent Results (from the past 240 hour(s))  SARS Coronavirus 2 (CEPHEID- Performed in [place] hospital lab), Hosp Order     Status: None   Collection Time: 03/23/19 10:21 AM  Result Value Ref Range Status   SARS Coronavirus 2 NEGATIVE NEGATIVE Final    Comment: (NOTE) If result is NEGATIVE SARS-CoV-2 target nucleic acids are NOT DETECTED. The SARS-CoV-2 RNA is generally detectable in upper and lower  respiratory specimens during the acute phase of infection. The lowest  concentration of SARS-CoV-2 viral copies this assay can detect is 250  copies / mL. Casmer Yepiz negative result does not preclude SARS-CoV-2 infection  and should not be used as the sole basis for treatment or other  patient management decisions.  Emersynn Deatley negative result may occur with  improper specimen collection / handling, submission of specimen other  than nasopharyngeal swab, presence of viral mutation(s) within the  areas targeted by this assay, and inadequate number of viral copies  (<250 copies / mL). Kymari Lollis negative result must be combined with clinical  observations, patient history, and epidemiological information. If result is POSITIVE SARS-CoV-2 target nucleic acids are DETECTED. The SARS-CoV-2 RNA is generally detectable in upper and lower  respiratory specimens dur ing the acute phase of infection.  Positive  results are indicative of active infection with SARS-CoV-2.  Clinical  correlation with patient history and other diagnostic information is  necessary to determine patient infection status.  Positive results do  not rule out bacterial infection or co-infection with other viruses. If result is PRESUMPTIVE POSTIVE SARS-CoV-2 nucleic acids MAY BE PRESENT.   Ellee Wawrzyniak presumptive positive result was obtained on the submitted specimen  and confirmed on repeat testing.  While 2019 novel coronavirus  (SARS-CoV-2) nucleic acids may be present in the submitted sample  additional confirmatory testing may be  necessary for epidemiological  and / or clinical management purposes  to differentiate between  SARS-CoV-2 and other Sarbecovirus currently known to infect humans.  If clinically indicated additional testing with an alternate test  methodology [phone]) is advised. The SARS-CoV-2 RNA is generally  detectable in upper and lower respiratory sp ecimens during the acute  phase of infection. The expected result is Negative. Fact Sheet for Patients:  StrictlyIdeas.no Fact Sheet for Healthcare Providers: BankingDealers.co.za This test is not yet approved or cleared by the [place] FDA and has been authorized for detection and/or diagnosis of SARS-CoV-2 by FDA under an Emergency Use Authorization (EUA).  This EUA will remain in effect (meaning this test can be used) for the duration of the COVID-19 declaration under Section 564(b)(1) of the Act, 21 U.S.C. section 360bbb-3(b)(1), unless the authorization is terminated or revoked sooner. Performed at [facility], [place]  [address], [place], [place] [address]   SARS Coronavirus 2 (CEPHEID - Performed in [place] hospital lab), Hosp Order     Status: None   Collection Time: 03/30/19  3:05 AM  Result Value Ref Range Status   SARS Coronavirus 2 NEGATIVE NEGATIVE Final    Comment: (NOTE) If result is NEGATIVE SARS-CoV-2 target nucleic acids are NOT DETECTED. The SARS-CoV-2 RNA is generally detectable in upper and lower  respiratory specimens during the acute phase of infection. The lowest  concentration of SARS-CoV-2 viral copies this assay can detect is 250  copies / mL. Wynne Rozak negative result does not preclude SARS-CoV-2 infection  and should not be used as the sole basis for treatment or other  patient management decisions.  Yailene Badia negative result may occur with  improper specimen collection / handling, submission of specimen other  than nasopharyngeal swab, presence of viral mutation(s) within the   areas targeted by this assay, and inadequate number of viral copies  (<250 copies / mL). Ruben Mahler negative result must be combined with clinical  observations, patient history, and epidemiological information. If result is POSITIVE SARS-CoV-2 target nucleic acids are DETECTED. The SARS-CoV-2 RNA is generally detectable in upper and lower  respiratory specimens dur ing the acute phase of infection.  Positive  results are indicative of active infection with SARS-CoV-2.  Clinical  correlation with patient history and other diagnostic information is  necessary to determine patient infection status.  Positive results do  not rule out bacterial infection or co-infection with other viruses. If result is PRESUMPTIVE POSTIVE SARS-CoV-2 nucleic acids MAY BE PRESENT.   Diana Davenport presumptive positive result was obtained on the submitted specimen  and confirmed on repeat testing.  While 2019 novel coronavirus  (SARS-CoV-2) nucleic acids may be present in the submitted sample  additional confirmatory testing may be necessary for epidemiological  and / or clinical management purposes  to differentiate between  SARS-CoV-2 and other Sarbecovirus currently known to infect humans.  If clinically indicated additional testing with an alternate test  methodology [id number]) is advised. The SARS-CoV-2 RNA is generally  detectable in upper and lower respiratory sp ecimens during the acute  phase of infection. The expected result is Negative. Fact Sheet for Patients:  StrictlyIdeas.no Fact Sheet for Healthcare Providers: BankingDealers.co.za This test is not yet approved or cleared by the [place] FDA and has been authorized for detection and/or diagnosis of SARS-CoV-2 by FDA under an Emergency Use Authorization (EUA).  This EUA will remain in effect (meaning this test can be used) for the duration of the COVID-19 declaration under Section 564(b)(1) of the Act, 21  U.S.C. section 360bbb-3(b)(1), unless the authorization is terminated or revoked sooner. Performed at [facility], [address] [place], [place] [address]   Culture, blood (Routine X 2) w Reflex to ID Panel     Status: None (Preliminary result)   Collection Time: 03/30/19  7:55 AM  Result Value Ref Range Status   Specimen Description BLOOD BLOOD LEFT ARM  Final   Special Requests   Final    BOTTLES DRAWN AEROBIC AND ANAEROBIC Blood Culture adequate volume   Culture   Final    NO GROWTH 2 DAYS Performed at [facility], [address], [place], [place] [address]    Report Status PENDING  Incomplete  Culture, blood (Routine X 2) w Reflex to ID Panel     Status: None (Preliminary result)   Collection Time: 03/30/19  7:59 AM  Result Value Ref Range Status  Specimen Description BLOOD BLOOD RIGHT HAND  Final   Special Requests   Final    BOTTLES DRAWN AEROBIC AND ANAEROBIC Blood Culture adequate volume   Culture   Final    NO GROWTH 2 DAYS Performed at [facility], [address], [place], [place] [address]    Report Status PENDING  Incomplete  MRSA PCR Screening     Status: None   Collection Time: 03/31/19  8:30 PM  Result Value Ref Range Status   MRSA by PCR NEGATIVE NEGATIVE Final    Comment:        The GeneXpert MRSA Assay (FDA approved for NASAL specimens only), is one component of Chrishun Scheer comprehensive MRSA colonization surveillance program. It is not intended to diagnose MRSA infection nor to guide or monitor treatment for MRSA infections. Performed at [facility], [place] [address], [place], [place] [address]      Labs: Basic Metabolic Panel: Recent Labs  Lab 03/30/19 0205 03/31/19 0323 04/01/19 0612  NA 141 140 140  K 3.8 3.7 4.2  CL 108 105 107  CO2 22 24 24   GLUCOSE 137* 120* 115*  BUN 13 12 13   CREATININE 0.91 1.08 0.92  CALCIUM 9.6 9.3 9.4   Liver Function Tests: Recent Labs  Lab 03/31/19 0323  AST 25  ALT 31  ALKPHOS 66  BILITOT 0.7  PROT  6.5  ALBUMIN 3.2*   No results for input(s): LIPASE, AMYLASE in the last 168 hours. No results for input(s): AMMONIA in the last 168 hours. CBC: Recent Labs  Lab 03/30/19 0205 03/31/19 0323 04/01/19 0612  WBC 8.1 9.1 8.6  NEUTROABS 4.1  --   --   HGB 13.9 13.4 13.4  HCT 45.2 43.1 43.1  MCV 91.9 90.7 90.9  PLT 167 163 156   Cardiac Enzymes: Recent Labs  Lab 03/30/19 0205 03/30/19 0810  TROPONINI <0.03 0.03*   BNP: BNP (last 3 results) Recent Labs    03/30/19 0205  BNP 79.0    ProBNP (last 3 results) No results for input(s): PROBNP in the last 8760 hours.  CBG: No results for input(s): GLUCAP in the last 168 hours.     Signed:  Fayrene Helper MD.  [facility] 04/01/2019, 9:29 AM

## 2019-04-02 ENCOUNTER — Telehealth: Payer: Self-pay | Admitting: Student

## 2019-04-02 LAB — HEPATITIS PANEL, ACUTE
HCV Ab: <0.1 s/co ratio (ref 0.0–0.9)
Hep A IgM: NEGATIVE
Hep B C IgM: NEGATIVE
Hepatitis B Surface Ag: NEGATIVE

## 2019-04-02 LAB — HIV ANTIBODY (ROUTINE TESTING W REFLEX): HIV Screen 4th Generation wRfx: NONREACTIVE

## 2019-04-02 NOTE — Telephone Encounter (Signed)

## 2019-04-04 LAB — CULTURE, BLOOD (ROUTINE X 2)
Culture: NO GROWTH
Culture: NO GROWTH
Special Requests: ADEQUATE
Special Requests: ADEQUATE

## 2019-04-09 ENCOUNTER — Other Ambulatory Visit: Payer: Self-pay

## 2019-04-09 ENCOUNTER — Other Ambulatory Visit (HOSPITAL_COMMUNITY): Payer: Self-pay | Admitting: Family Medicine

## 2019-04-09 ENCOUNTER — Encounter: Payer: Self-pay | Admitting: Cardiology

## 2019-04-09 ENCOUNTER — Ambulatory Visit (HOSPITAL_COMMUNITY)
Admission: RE | Admit: 2019-04-09 | Discharge: 2019-04-09 | Disposition: A | Discharge status: Home / Self Care | Payer: Managed Care, Other (non HMO) | Source: Ambulatory Visit | Attending: Family Medicine | Admitting: Family Medicine

## 2019-04-09 ENCOUNTER — Telehealth (INDEPENDENT_AMBULATORY_CARE_PROVIDER_SITE_OTHER): Payer: Managed Care, Other (non HMO) | Admitting: Cardiology

## 2019-04-09 VITALS — BP 140/93 | Wt 303.0 lb

## 2019-04-09 DIAGNOSIS — I5042 Chronic combined systolic (congestive) and diastolic (congestive) heart failure: Secondary | ICD-10-CM | POA: Diagnosis not present

## 2019-04-09 DIAGNOSIS — R0602 Shortness of breath: Secondary | ICD-10-CM | POA: Insufficient documentation

## 2019-04-09 DIAGNOSIS — I10 Essential (primary) hypertension: Secondary | ICD-10-CM

## 2019-04-09 DIAGNOSIS — R609 Edema, unspecified: Secondary | ICD-10-CM | POA: Insufficient documentation

## 2019-04-09 DIAGNOSIS — R000 Tachycardia, unspecified: Secondary | ICD-10-CM

## 2019-04-09 NOTE — Progress Notes (Signed)
Virtual Visit via Telephone Note   This visit type was conducted due to national recommendations for restrictions regarding the COVID-19 Pandemic (e.g. social distancing) in an effort to limit this patient's exposure and mitigate transmission in our community.  Due to his co-morbid illnesses, this patient is at least at moderate risk for complications without adequate follow up.  This format is felt to be most appropriate for this patient at this time.  The patient did not have access to video technology/had technical difficulties with video requiring transitioning to audio format only (telephone).  All issues noted in this document were discussed and addressed.  No physical exam could be performed with this format.  Please refer to the patient's chart for his  consent to telehealth for [facility].   We tried video but I could not see him and he could not hear me so this was a phone note.  Date:  04/09/2019   ID:  Roberto Guerra, DOB [birth year], MRN [id number]  Patient Location: Home Provider Location: Office  PCP:  Roberto Squibb, MD  Cardiologist:  Minus Breeding, MD in [place]  wil ask Dr. Harl Bowie to see pt due to pt having difficulty getting to [facility] or [place]. Electrophysiologist:  None   Evaluation Performed:  Follow-Up Visit  Chief Complaint:  HF post hospital   History of Present Illness:    Roberto Guerra is a 58 y.o. male with HTN, HLD, DM-2 and recent admit for CHF.  He was drinking tomato juice with that admit.  He was admitted 03/31/19 and diuresed.  EKG with ST PVCs and voltage criteria for LVH.  No acut eST- T changes.   Echo with EF 40-45%, mild concentric LVH, normal RV,  Pt had stress test with EF 25%,  And mild defects in the basal inf. Location and in the apex location.  High risk nuc due to EF but no acute ischemia.  meds adjusted  With stopping amlodipine, and new meds, lasix 40 with Kdur, coreg and lisinopril.  Also on Pravachol.   Today he has only been  taking his coreg once a day.  He will increase to twice a day. No SOB, no edema.  He has had racing HR every day.  He saw Dr. Nevada Crane today and EKG was done and CXR, CXR was clear, no results on EKG or labs.  BP today was 140/93 and wt was 3030 per pt at Dr. Juel Burrow office.  He has been weighing daily.    Needs BMET considered adding bidil per discharge but he is only taking coreg once a day so will increase.  No chest pain.  The patient does not have symptoms concerning for COVID-19 infection (fever, chills, cough, or new shortness of breath).    Past Medical History:  Diagnosis Date  . Diabetes mellitus without complication ([place])   . Gout   . Hyperlipidemia   . Hypertension   . PNA (pneumonia)    Past Surgical History:  Procedure Laterality Date  . CATARACT EXTRACTION W/PHACO Left 10/29/2018   Procedure: CATARACT EXTRACTION PHACO AND INTRAOCULAR LENS PLACEMENT (IOC);  Surgeon: Tonny Branch, MD;  Location: [facility] ORS;  Service: Ophthalmology;  Laterality: Left;  CDE: 43.37  . COLONOSCOPY N/A 02/27/2014   Procedure: COLONOSCOPY;  Surgeon: Rogene Houston, MD;  Location: [facility] ENDO SUITE;  Service: Endoscopy;  Laterality: N/A;  830  . NO PAST SURGERIES       Current Meds  Medication Sig  . allopurinol (ZYLOPRIM) 300  MG tablet Take 300 mg by mouth daily.    . carvedilol (COREG) 6.25 MG tablet Take 1 tablet (6.25 mg total) by mouth 2 (two) times daily with a meal for 30 days. (Patient taking differently: Take 6.25 mg by mouth daily. )  . furosemide (LASIX) 40 MG tablet Take 1 tablet (40 mg total) by mouth daily for 30 days.  Marland Kitchen gabapentin (NEURONTIN) 300 MG capsule Take 300 mg by mouth daily.  Marland Kitchen HYDROcodone-acetaminophen (NORCO/VICODIN) 5-325 MG tablet Take 1 tablet by mouth every 6 (six) hours as needed for severe pain.  Marland Kitchen lisinopril (ZESTRIL) 20 MG tablet Take 1 tablet (20 mg total) by mouth daily for 30 days.  . meloxicam (MOBIC) 15 MG tablet Take 15 mg by mouth daily.  . metFORMIN (GLUCOPHAGE)  1000 MG tablet Take 1,000 mg by mouth daily with breakfast.   . potassium chloride (K-DUR) 10 MEQ tablet Take 1 tablet (10 mEq total) by mouth daily for 30 days.  . pravastatin (PRAVACHOL) 40 MG tablet Take 40 mg by mouth daily.      Allergies:   Patient has no known allergies.   Social History   Tobacco Use  . Smoking status: Former Smoker    Packs/day: 0.25    Years: 1.00    Pack years: 0.25    Types: Cigarettes  . Smokeless tobacco: Never Used  Substance Use Topics  . Alcohol use: No  . Drug use: No     Family Hx: The patient's family history includes Diabetes in his sister and another family member. There is no history of Colon cancer.  ROS:   Please see the history of present illness.    General:no colds or fevers, + some weight increase Skin:no rashes or ulcers HEENT:no blurred vision, no congestion CV:see HPI PUL:see HPI GI:no diarrhea constipation or melena, no indigestion GU:no hematuria, no dysuria MS:no joint pain, no claudication Neuro:no syncope, no lightheadedness Endo:no diabetes, no thyroid disease  All other systems reviewed and are negative.   Prior CV studies:   The following studies were reviewed today:   1. The left ventricle has mild-moderately reduced systolic function, with an ejection fraction of 40-45%. The cavity size was normal. There is mild concentric left ventricular hypertrophy. Left ventricular diastolic Doppler parameters are consistent  with pseudonormalization. Elevated mean left atrial pressure.  2. The right ventricle has normal systolic function. The cavity was normal. There is no increase in right ventricular wall thickness. Right ventricular systolic pressure could not be assessed.  3. There is mild mitral annular calcification present.  4. The tricuspid valve is grossly normal.  SUMMARY   The study is inadequate for wall motion analysis.  FINDINGS  Left Ventricle: The left ventricle has mild-moderately reduced systolic  function, with an ejection fraction of 40-45%. The cavity size was normal. There is mild concentric left ventricular hypertrophy. Left ventricular diastolic Doppler parameters are  consistent with pseudonormalization. Elevated mean left atrial pressure  Right Ventricle: The right ventricle has normal systolic function. The cavity was normal. There is no increase in right ventricular wall thickness. Right ventricular systolic pressure could not be assessed.  Left Atrium: Left atrial size was normal in size.  Right Atrium: Right atrial size was normal in size. Right atrial pressure is estimated at 3 mmHg.  Interatrial Septum: No atrial level shunt detected by color flow Doppler.  Pericardium: There is no evidence of pericardial effusion.  Mitral Valve: The mitral valve is normal in structure. There is mild mitral annular  calcification present. Mitral valve regurgitation is trivial by color flow Doppler.  Tricuspid Valve: The tricuspid valve is grossly normal. Tricuspid valve regurgitation was not visualized by color flow Doppler.  Aortic Valve: The aortic valve is normal in structure. Aortic valve regurgitation was not visualized by color flow Doppler.  Pulmonic Valve: The pulmonic valve was grossly normal. Pulmonic valve regurgitation is not visualized by color flow Doppler.      Labs/Other Tests and Data Reviewed:    EKG:  An ECG dated 03/30/19 was personally reviewed today and demonstrated:  ST at 127 and no acute ST changes, + PVCs.  Recent Labs: 03/30/2019: B Natriuretic Peptide 79.0; TSH 3.720 03/31/2019: ALT 31 04/01/2019: BUN 13; Creatinine, Ser 0.92; Hemoglobin 13.4; Platelets 156; Potassium 4.2; Sodium 140   Recent Lipid Panel Lab Results  Component Value Date/Time   CHOL 161 03/30/2019 08:10 AM   TRIG 131 03/30/2019 08:10 AM   HDL 49 03/30/2019 08:10 AM   CHOLHDL 3.3 03/30/2019 08:10 AM   LDLCALC 86 03/30/2019 08:10 AM    Wt Readings from Last 3 Encounters:   04/09/19 (!) 303 lb (137.4 kg)  04/01/19 298 lb 12.8 oz (135.5 kg)  03/23/19 300 lb (136.1 kg)     Objective:    Vital Signs:  BP (!) 140/93   Wt (!) 303 lb (137.4 kg)   BMI 48.91 kg/m    VITAL SIGNS:  reviewed  General Male in NAD Neuro A&O X 3 follows commands, answers questions approp. Lungs can speak in complete sentences without SOB Psych:  Pleasant afffect   ASSESSMENT & PLAN:    1. Chronic systolic and diastolic HF stable on lasix 40 mg daily, wt is up some but CXR clear today.  Decreased EF may be due to tachycardia  Neg nec in hospital. On lasix 40 daily, Dr. Nevada Crane did labs today.   2. Tachycardia daily, pt is aware, will add ziopatch for 2 weeks to eval.  He was ST in the hospital- was only taking coreg once a day, he will increase to BID 3.  HTN, no addition for now will increase coreg and see how BP is , if not improved would add BiDil 4. Obesity understands he needs to lose wt.  5. DM-2 per PCP  COVID-19 Education: The signs and symptoms of COVID-19 were discussed with the patient and how to seek care for testing (follow up with PCP or arrange E-visit).  The importance of social distancing was discussed today.  Time:   Today, I have spent 10 minutes with the patient with telehealth technology discussing the above problems.     Medication Adjustments/Labs and Tests Ordered: Current medicines are reviewed at length with the patient today.  Concerns regarding medicines are outlined above.   Tests Ordered: No orders of the defined types were placed in this encounter.   Medication Changes: No orders of the defined types were placed in this encounter.   Disposition:  Follow up in 3 week(s)  Signed, Cecilie Kicks, NP  04/09/2019 3:52 PM    [facility]

## 2019-04-09 NOTE — Patient Instructions (Signed)
Medication Instructions:  Your physician recommends that you continue on your current medications as directed. Please refer to the Current Medication list given to you today.  Please Take Coreg 6.25 mg Two Times Daily   Labwork: NONE  Testing/Procedures: Your physician has recommended that you wear an event monitor for 2 weekseven. Event monitors are medical devices that record the heart's electrical activity. Doctors most often [place] these monitors to diagnose arrhythmias. Arrhythmias are problems with the speed or rhythm of the heartbeat. The monitor is a small, portable device. You can wear one while you do your normal daily activities. This is usually used to diagnose what is causing palpitations/syncope (passing out).    Follow-Up: Your physician recommends that you schedule a follow-up appointment in: 3 Weeks   Any Other Special Instructions Will Be Listed Below (If Applicable).     If you need a refill on your cardiac medications before your next appointment, please call your pharmacy.  Thank you for choosing [facility]!

## 2019-04-16 ENCOUNTER — Ambulatory Visit (INDEPENDENT_AMBULATORY_CARE_PROVIDER_SITE_OTHER): Payer: Managed Care, Other (non HMO)

## 2019-04-16 DIAGNOSIS — R000 Tachycardia, unspecified: Secondary | ICD-10-CM

## 2019-05-01 ENCOUNTER — Encounter: Payer: Self-pay | Admitting: Student

## 2019-05-01 ENCOUNTER — Emergency Department (HOSPITAL_COMMUNITY): Payer: Managed Care, Other (non HMO)

## 2019-05-01 ENCOUNTER — Encounter (HOSPITAL_COMMUNITY): Payer: Self-pay | Admitting: Emergency Medicine

## 2019-05-01 ENCOUNTER — Emergency Department (HOSPITAL_COMMUNITY)
Admission: EM | Admit: 2019-05-01 | Discharge: 2019-05-01 | Disposition: A | Discharge status: Home / Self Care | Payer: Managed Care, Other (non HMO) | Attending: Emergency Medicine | Admitting: Emergency Medicine

## 2019-05-01 ENCOUNTER — Other Ambulatory Visit: Payer: Self-pay

## 2019-05-01 ENCOUNTER — Ambulatory Visit (INDEPENDENT_AMBULATORY_CARE_PROVIDER_SITE_OTHER): Payer: Managed Care, Other (non HMO) | Admitting: Student

## 2019-05-01 VITALS — BP 151/89 | HR 90 | Ht 66.0 in | Wt 301.0 lb

## 2019-05-01 DIAGNOSIS — E785 Hyperlipidemia, unspecified: Secondary | ICD-10-CM | POA: Diagnosis not present

## 2019-05-01 DIAGNOSIS — I10 Essential (primary) hypertension: Secondary | ICD-10-CM

## 2019-05-01 DIAGNOSIS — E119 Type 2 diabetes mellitus without complications: Secondary | ICD-10-CM | POA: Insufficient documentation

## 2019-05-01 DIAGNOSIS — Z87891 Personal history of nicotine dependence: Secondary | ICD-10-CM | POA: Insufficient documentation

## 2019-05-01 DIAGNOSIS — R0789 Other chest pain: Secondary | ICD-10-CM | POA: Diagnosis not present

## 2019-05-01 DIAGNOSIS — I110 Hypertensive heart disease with heart failure: Secondary | ICD-10-CM | POA: Insufficient documentation

## 2019-05-01 DIAGNOSIS — R072 Precordial pain: Secondary | ICD-10-CM

## 2019-05-01 DIAGNOSIS — I5042 Chronic combined systolic (congestive) and diastolic (congestive) heart failure: Secondary | ICD-10-CM | POA: Diagnosis not present

## 2019-05-01 DIAGNOSIS — Z7984 Long term (current) use of oral hypoglycemic drugs: Secondary | ICD-10-CM | POA: Insufficient documentation

## 2019-05-01 DIAGNOSIS — Z79899 Other long term (current) drug therapy: Secondary | ICD-10-CM | POA: Diagnosis not present

## 2019-05-01 DIAGNOSIS — R079 Chest pain, unspecified: Secondary | ICD-10-CM

## 2019-05-01 DIAGNOSIS — I509 Heart failure, unspecified: Secondary | ICD-10-CM | POA: Insufficient documentation

## 2019-05-01 HISTORY — DX: Heart failure, unspecified: I50.9

## 2019-05-01 LAB — CBC
HCT: 45.7 % (ref 39.0–52.0)
Hemoglobin: 13.9 g/dL (ref 13.0–17.0)
MCH: 28.4 pg (ref 26.0–34.0)
MCHC: 30.4 g/dL (ref 30.0–36.0)
MCV: 93.3 fL (ref 80.0–100.0)
Platelets: 140 10*3/uL — ABNORMAL LOW (ref 150–400)
RBC: 4.9 MIL/uL (ref 4.22–5.81)
RDW: 14 % (ref 11.5–15.5)
WBC: 6.9 10*3/uL (ref 4.0–10.5)
nRBC: 0 % (ref 0.0–0.2)

## 2019-05-01 LAB — BASIC METABOLIC PANEL
Anion gap: 13 (ref 5–15)
BUN: 18 mg/dL (ref 6–20)
CO2: 24 mmol/L (ref 22–32)
Calcium: 9.6 mg/dL (ref 8.9–10.3)
Chloride: 104 mmol/L (ref 98–111)
Creatinine, Ser: 1 mg/dL (ref 0.61–1.24)
GFR calc Af Amer: >60 mL/min (ref >60)
GFR calc non Af Amer: >60 mL/min (ref >60)
Glucose, Bld: 129 mg/dL — ABNORMAL HIGH (ref 70–99)
Potassium: 3.9 mmol/L (ref 3.5–5.1)
Sodium: 141 mmol/L (ref 135–145)

## 2019-05-01 LAB — BRAIN NATRIURETIC PEPTIDE: B Natriuretic Peptide: 22 pg/mL (ref 0.0–100.0)

## 2019-05-01 LAB — TROPONIN I (HIGH SENSITIVITY)
Troponin I (High Sensitivity): 4 ng/L (ref <18)
Troponin I (High Sensitivity): 4 ng/L (ref <18)

## 2019-05-01 MED ORDER — ASPIRIN 81 MG PO CHEW
324.0000 mg | CHEWABLE_TABLET | Freq: Once | ORAL | Status: AC
  Administered 2019-05-01: 324 mg via ORAL
  Filled 2019-05-01: qty 4

## 2019-05-01 MED ORDER — POTASSIUM CHLORIDE ER 10 MEQ PO TBCR: 10.0000 meq | EXTENDED_RELEASE_TABLET | Freq: Every day | ORAL | 3 refills | Status: DC

## 2019-05-01 MED ORDER — FUROSEMIDE 40 MG PO TABS: 40.0000 mg | ORAL_TABLET | Freq: Every day | ORAL | 3 refills | Status: DC

## 2019-05-01 MED ORDER — LISINOPRIL 20 MG PO TABS: 20.0000 mg | ORAL_TABLET | Freq: Every day | ORAL | 3 refills | Status: DC

## 2019-05-01 MED ORDER — CARVEDILOL 12.5 MG PO TABS: 12.5000 mg | ORAL_TABLET | Freq: Two times a day (BID) | ORAL | 3 refills | Status: DC

## 2019-05-01 NOTE — ED Triage Notes (Signed)
Pt c/o LT sided, non-radiating CP that has been intermittent since 1400 yesterday. Denies any other symptoms.

## 2019-05-01 NOTE — ED Provider Notes (Signed)
[facility] EMERGENCY DEPARTMENT Provider Note   CSN: [id number] Arrival date & time: 05/01/19  0701    History   Chief Complaint Chief Complaint  Patient presents with  . Chest Pain    HPI Roberto Guerra is a 58 y.o. male.     Patient with history of obesity, lipidemia, high blood pressure, recent admission for heart failure exacerbation presents with intermittent chest squeezing sensation since 2:00 PM yesterday.  No specific causes.  Patient does not regularly have exertional chest pain.  Patient had a stress test and a CT scan recently in the hospital.  Patient has outpatient follow-up with cardiology.  Patient has been compliant with his medications for his heart.  Patient denies any shortness of breath leg swelling or weight gain.  No blood clot history or blood clot risk factors.     Past Medical History:  Diagnosis Date  . CHF (congestive heart failure) ([place])   . Diabetes mellitus without complication ([place])   . Gout   . Hyperlipidemia   . Hypertension   . PNA (pneumonia)     Patient Active Problem List   Diagnosis Date Noted  . Dyspnea 03/30/2019  . Tachycardia 03/30/2019  . Neuralgia of lower extremity 08/15/2012  . ERECTILE DYSFUNCTION 08/29/2008  . CERUMEN IMPACTION, BILATERAL 06/27/2008  . GOUT 09/21/2007  . HYPERLIPIDEMIA 10/12/2006  . OBESITY NOS 10/12/2006  . Essential hypertension 10/12/2006  . GERD 10/12/2006  . HIATAL HERNIA 10/12/2006    Past Surgical History:  Procedure Laterality Date  . CATARACT EXTRACTION W/PHACO Left 10/29/2018   Procedure: CATARACT EXTRACTION PHACO AND INTRAOCULAR LENS PLACEMENT (IOC);  Surgeon: Tonny Branch, MD;  Location: [facility] ORS;  Service: Ophthalmology;  Laterality: Left;  CDE: 43.37  . COLONOSCOPY N/A 02/27/2014   Procedure: COLONOSCOPY;  Surgeon: Rogene Houston, MD;  Location: [facility] ENDO SUITE;  Service: Endoscopy;  Laterality: N/A;  830  . NO PAST SURGERIES          Home Medications    Prior to Admission  medications   Medication Sig Start Date End Date Taking? Authorizing Provider  allopurinol (ZYLOPRIM) 300 MG tablet Take 300 mg by mouth daily.     Yes [provider]  carvedilol (COREG) 6.25 MG tablet Take 1 tablet (6.25 mg total) by mouth 2 (two) times daily with a meal for 30 days. 04/01/19 05/01/19 Yes Elodia Florence., MD  furosemide (LASIX) 40 MG tablet Take 1 tablet (40 mg total) by mouth daily for 30 days. 04/01/19 05/01/19 Yes Elodia Florence., MD  gabapentin (NEURONTIN) 300 MG capsule Take 300 mg by mouth daily.   Yes [provider]  HYDROcodone-acetaminophen (NORCO/VICODIN) 5-325 MG tablet Take 1 tablet by mouth every 6 (six) hours as needed for severe pain.   Yes [provider]  lisinopril (ZESTRIL) 20 MG tablet Take 1 tablet (20 mg total) by mouth daily for 30 days. 04/01/19 05/01/19 Yes Elodia Florence., MD  meloxicam (MOBIC) 15 MG tablet Take 15 mg by mouth daily.   Yes [provider]  metFORMIN (GLUCOPHAGE) 1000 MG tablet Take 1,000 mg by mouth daily with breakfast.    Yes [provider]  potassium chloride (K-DUR) 10 MEQ tablet Take 1 tablet (10 mEq total) by mouth daily for 30 days. 04/01/19 05/01/19 Yes Elodia Florence., MD  pravastatin (PRAVACHOL) 40 MG tablet Take 40 mg by mouth daily.    Yes [provider]  Omega-3 Fatty Acids (FISH OIL) 1000  MG CAPS Take 1,000 mg by mouth daily.    [provider]    Family History Family History  Problem Relation Age of Onset  . Diabetes Other   . Diabetes Sister   . Colon cancer Neg Hx     Social History Social History   Tobacco Use  . Smoking status: Former Smoker    Packs/day: 0.25    Years: 1.00    Pack years: 0.25    Types: Cigarettes    Quit date: 11/30/1998    Years since quitting: 20.4  . Smokeless tobacco: Never Used  Substance Use Topics  . Alcohol use: No  . Drug use: No     Allergies   Patient has no known allergies.    Review of Systems Review of Systems  Constitutional: Negative for chills and fever.  HENT: Negative for congestion.   Eyes: Negative for visual disturbance.  Respiratory: Negative for cough and shortness of breath.   Cardiovascular: Positive for chest pain. Negative for leg swelling.  Gastrointestinal: Negative for abdominal pain and vomiting.  Genitourinary: Negative for dysuria and flank pain.  Musculoskeletal: Negative for back pain, neck pain and neck stiffness.  Skin: Negative for rash.  Neurological: Negative for light-headedness and headaches.     Physical Exam Updated Vital Signs BP 135/90   Pulse 82   Temp 98.8 F (37.1 C) (Oral)   Resp 17   Ht 5\' 6"  (1.676 m)   Wt 133.4 kg   SpO2 95%   BMI 47.45 kg/m   Physical Exam Vitals signs and nursing note reviewed.  Constitutional:      Appearance: He is well-developed.  HENT:     Head: Normocephalic and atraumatic.  Eyes:     General:        Right eye: No discharge.        Left eye: No discharge.     Conjunctiva/sclera: Conjunctivae normal.  Neck:     Musculoskeletal: Normal range of motion and neck supple.     Trachea: No tracheal deviation.  Cardiovascular:     Rate and Rhythm: Normal rate and regular rhythm.  Pulmonary:     Effort: Pulmonary effort is normal.     Breath sounds: Normal breath sounds.  Abdominal:     General: There is no distension.     Palpations: Abdomen is soft.     Tenderness: There is no abdominal tenderness. There is no guarding.  Musculoskeletal:     Right lower leg: No edema.     Left lower leg: No edema.  Skin:    General: Skin is warm.     Findings: No rash.  Neurological:     Mental Status: He is alert and oriented to person, place, and time.      ED Treatments / Results  Labs (all labs ordered are listed, but only abnormal results are displayed) Labs Reviewed  BASIC METABOLIC PANEL - Abnormal; Notable for the following components:      Result Value   Glucose, Bld 129  (*)    All other components within normal limits  CBC - Abnormal; Notable for the following components:   Platelets 140 (*)    All other components within normal limits  TROPONIN I (HIGH SENSITIVITY)  TROPONIN I (HIGH SENSITIVITY)  BRAIN NATRIURETIC PEPTIDE    EKG EKG Interpretation  Date/Time:  Wednesday May 01 2019 07:13:38 EDT Ventricular Rate:  87 PR Interval:    QRS Duration: 96 QT Interval:  378 QTC Calculation:  455 R Axis:   50 Text Interpretation:  Sinus rhythm Nonspecific T abnormalities, diffuse leads similar previous Confirmed by Elnora Morrison [phone]) on 05/01/2019 8:10:01 AM   Radiology Dg Chest 2 View  Result Date: 05/01/2019 CLINICAL DATA:  Chest pain. EXAM: CHEST - 2 VIEW COMPARISON:  Radiographs of April 09, 2019. FINDINGS: The heart size and mediastinal contours are within normal limits. Both lungs are clear. No pneumothorax or pleural effusion is noted. The visualized skeletal structures are unremarkable. IMPRESSION: No active cardiopulmonary disease. Electronically Signed   By: Marijo Conception M.D.   On: 05/01/2019 08:30    Procedures Procedures (including critical care time)  Medications Ordered in ED Medications  aspirin chewable tablet 324 mg (324 mg Oral Given 05/01/19 0930)     Initial Impression / Assessment and Plan / ED Course  I have reviewed the triage vital signs and the nursing notes.  Pertinent labs & imaging results that were available during my care of the patient were reviewed by me and considered in my medical decision making (see chart for details).       Patient with vascular risk factors and recent acute heart failure exacerbation presents with intermittent chest squeezing sensation nonradiating since yesterday afternoon.  Patient is well-appearing currently with no significant symptoms.  Plan for troponin, basic blood work to check for anemia electrolytes and kidney function, chest x-ray to look for signs of pulmonary edema or other  acute abnormalities.  Patient denies infectious symptoms.  Likely plan for discussion with patient's cardiologist for further recommendations after troponin back.  Patient's delta troponin is negative.  Discussed with Dr. Bronson Ing and with recent negative stress test recommends outpatient follow-up.  Patient has no symptoms on reassessment.  Vital signs unremarkable on discharge. Final Clinical Impressions(s) / ED Diagnoses   Final diagnoses:  Acute chest pain    ED Discharge Orders    None       Elnora Morrison, MD 05/01/19 1542

## 2019-05-01 NOTE — Progress Notes (Signed)
Cardiology Office Note    Date:  05/01/2019   ID:  Roberto Guerra, DOB 02/26/1961, MRN [id number]  PCP:  Celene Squibb, MD  Cardiologist: Minus Breeding, MD  --> wishes to switch to Dr. Harl Bowie and the [facility] due to transportation  Chief Complaint  Patient presents with  . Follow-up    History of Present Illness:    Roberto Guerra is a 58 y.o. male with past medical history of chronic combined systolic and diastolic CHF, HTN, HLD, and Type 2 DM who presents to the office today for 3-week follow-up.  He was admitted to [facility] in 03/2019 and was found to have a newly reduced EF of 40 to 45%. A Lexiscan Myoview was obtained and showed no reversible ischemia.  He was discharged on Coreg 6.25 mg twice daily, Lasix 40 mg daily, and Lisinopril 20mg  daily. He had a telehealth visit with Cecilie Kicks, NP on 04/09/2019 and denied any recent shortness of breath but did have intermittent palpitations. He had only been taking Coreg once daily. It was recommended he wear a ZIO patch for 2 weeks and consider the use of BiDil if BP remained elevated following titration of Coreg to twice daily dosing.  By review of notes, he presented to [facility] ED at 0700 today for evaluation of chest pain. Symptoms had been constant since yesterday afternoon. BNP was normal at 22 with initial and delta troponin values being negative. CXR showed no active cardiopulmonary disease. EKG showed NSR, HR 87, with LVH and no acute ST changes when compared to prior tracings.   In talking with the patient today, he reports overall doing well until yesterday when he developed a squeezing sensation along his left pectoral region. He describes this as being aware of his heart pumping and denies any specific pain. This has been constant since yesterday and as outlined above recent ED evaluation earlier today was unrevealing. No association with exertion or positional changes. He reports his breathing has overall been at  baseline and denies any associated orthopnea, PND, or lower extremity edema. Weight has been stable on his home scales.  BP has remained elevated with systolic readings in the 867'Y to 160's when checked at home. He just mailed back his ZIO patch yesterday therefore results are not yet available.  Past Medical History:  Diagnosis Date  . CHF (congestive heart failure) ([place])   . Diabetes mellitus without complication ([place])   . Gout   . Hyperlipidemia   . Hypertension   . PNA (pneumonia)     Past Surgical History:  Procedure Laterality Date  . CATARACT EXTRACTION W/PHACO Left 10/29/2018   Procedure: CATARACT EXTRACTION PHACO AND INTRAOCULAR LENS PLACEMENT (IOC);  Surgeon: Tonny Branch, MD;  Location: [facility] ORS;  Service: Ophthalmology;  Laterality: Left;  CDE: 43.37  . COLONOSCOPY N/A 02/27/2014   Procedure: COLONOSCOPY;  Surgeon: Rogene Houston, MD;  Location: [facility] ENDO SUITE;  Service: Endoscopy;  Laterality: N/A;  830  . NO PAST SURGERIES      Current Medications: Outpatient Medications Prior to Visit  Medication Sig Dispense Refill  . allopurinol (ZYLOPRIM) 300 MG tablet Take 300 mg by mouth daily.      Marland Kitchen gabapentin (NEURONTIN) 300 MG capsule Take 300 mg by mouth daily.    Marland Kitchen HYDROcodone-acetaminophen (NORCO/VICODIN) 5-325 MG tablet Take 1 tablet by mouth every 6 (six) hours as needed for severe pain.    . meloxicam (MOBIC) 15 MG tablet Take 15 mg by  mouth daily.    . metFORMIN (GLUCOPHAGE) 1000 MG tablet Take 1,000 mg by mouth daily with breakfast.     . Omega-3 Fatty Acids (FISH OIL) 1000 MG CAPS Take 1,000 mg by mouth daily.    . pravastatin (PRAVACHOL) 40 MG tablet Take 40 mg by mouth daily.     . carvedilol (COREG) 6.25 MG tablet Take 1 tablet (6.25 mg total) by mouth 2 (two) times daily with a meal for 30 days. 60 tablet 0  . furosemide (LASIX) 40 MG tablet Take 1 tablet (40 mg total) by mouth daily for 30 days. 30 tablet 0  . lisinopril (ZESTRIL) 20 MG tablet Take 1 tablet (20 mg  total) by mouth daily for 30 days. 30 tablet 0  . potassium chloride (K-DUR) 10 MEQ tablet Take 1 tablet (10 mEq total) by mouth daily for 30 days. 30 tablet 0   No facility-administered medications prior to visit.      Allergies:   Patient has no known allergies.   Social History   Socioeconomic History  . Marital status: Married    Spouse name: Not on file  . Number of children: Not on file  . Years of education: 61  . Highest education level: Not on file  Occupational History    Employer: [employer]  Social Needs  . Financial resource strain: Not on file  . Food insecurity    Worry: Not on file    Inability: Not on file  . Transportation needs    Medical: Not on file    Non-medical: Not on file  Tobacco Use  . Smoking status: Former Smoker    Packs/day: 0.25    Years: 1.00    Pack years: 0.25    Types: Cigarettes    Quit date: 11/30/1998    Years since quitting: 20.4  . Smokeless tobacco: Never Used  Substance and Sexual Activity  . Alcohol use: No  . Drug use: No  . Sexual activity: Not on file  Lifestyle  . Physical activity    Days per week: Not on file    Minutes per session: Not on file  . Stress: Not on file  Relationships  . Social Herbalist on phone: Not on file    Gets together: Not on file    Attends religious service: Not on file    Active member of club or organization: Not on file    Attends meetings of clubs or organizations: Not on file    Relationship status: Not on file  Other Topics Concern  . Not on file  Social History Narrative   Lives with wife.  Daughter at home.  Supervisor.       Family History:  The patient's family history includes Diabetes in his sister and another family member.   Review of Systems:   Please see the history of present illness.     General:  No chills, fever, night sweats or weight changes.  Cardiovascular:  No dyspnea on exertion, edema, orthopnea,  paroxysmal nocturnal dyspnea. Positive  for chest discomfort and palpitations.  Dermatological: No rash, lesions/masses Respiratory: No cough, dyspnea Urologic: No hematuria, dysuria Abdominal:   No nausea, vomiting, diarrhea, bright red blood per rectum, melena, or hematemesis Neurologic:  No visual changes, wkns, changes in mental status. All other systems reviewed and are otherwise negative except as noted above.   Physical Exam:    VS:  BP (!) 151/89   Pulse 90   Ht  5\' 6"  (1.676 m)   Wt (!) 301 lb (136.5 kg)   SpO2 98%   BMI 48.58 kg/m    General: Well developed, obese African American male appearing in no acute distress. Head: Normocephalic, atraumatic, sclera non-icteric, no xanthomas, nares are without discharge.  Neck: No carotid bruits. JVD difficult to assess given body habitus.  Lungs: Respirations regular and unlabored, without wheezes or rales.  Heart: Regular rate and rhythm. No S3 or S4.  No murmur, no rubs, or gallops appreciated. Abdomen: Soft, non-tender, non-distended with normoactive bowel sounds. No hepatomegaly. No rebound/guarding. No obvious abdominal masses. Msk:  Strength and tone appear normal for age. No joint deformities or effusions. Extremities: No clubbing or cyanosis. Trace ankle edema bilaterally.  Distal pedal pulses are 2+ bilaterally. Neuro: Alert and oriented X 3. Moves all extremities spontaneously. No focal deficits noted. Psych:  Responds to questions appropriately with a normal affect. Skin: No rashes or lesions noted  Wt Readings from Last 3 Encounters:  05/01/19 (!) 301 lb (136.5 kg)  05/01/19 294 lb (133.4 kg)  04/09/19 (!) 303 lb (137.4 kg)     Studies/Labs Reviewed:   EKG:  EKG is not ordered today. EKG from ED visit this morning reviewed and shows NSR, HR 87, with LVH and no acute ST changes when compared to prior tracings.   Recent Labs: 03/30/2019: TSH 3.720 03/31/2019: ALT 31 05/01/2019: B Natriuretic Peptide 22.0; BUN 18; Creatinine, Ser 1.00; Hemoglobin 13.9;  Platelets 140; Potassium 3.9; Sodium 141   Lipid Panel    Component Value Date/Time   CHOL 161 03/30/2019 0810   TRIG 131 03/30/2019 0810   HDL 49 03/30/2019 0810   CHOLHDL 3.3 03/30/2019 0810   VLDL 26 03/30/2019 0810   LDLCALC 86 03/30/2019 0810    Additional studies/ records that were reviewed today include:   Echocardiogram: 03/30/2019 IMPRESSIONS   1. The left ventricle has mild-moderately reduced systolic function, with an ejection fraction of 40-45%. The cavity size was normal. There is mild concentric left ventricular hypertrophy. Left ventricular diastolic Doppler parameters are consistent  with pseudonormalization. Elevated mean left atrial pressure.  2. The right ventricle has normal systolic function. The cavity was normal. There is no increase in right ventricular wall thickness. Right ventricular systolic pressure could not be assessed.  3. There is mild mitral annular calcification present.  4. The tricuspid valve is grossly normal.   NST: 03/31/2019  There was no ST segment deviation noted during stress.  No T wave inversion was noted during stress.  Defect 1: There is a medium defect of mild severity present in the basal inferior location.  Defect 2: There is a small defect of mild severity present in the apex location.  This is a high risk study.  The left ventricular ejection fraction is severely decreased (<30%).  Nuclear stress EF: 25%.   High risk nuclear perfusion study due to severely depressed left ventricular systolic function, without reversible ischemic perfusion defects. 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  Assessment:    1. Chronic combined systolic and diastolic heart failure ([place])   2. Precordial pain   3. Essential hypertension   4. Hyperlipidemia, unspecified hyperlipidemia type      Plan:   In order of problems listed above:  1.  Chronic Combined Systolic and Diastolic CHF - Recent echocardiogram showed a reduced EF of 40 to 45%, thought to possibly be tachycardia-induced. He reports his breathing  has been at baseline and denies any recent orthopnea, PND, or lower extremity edema. Weight has been stable on his home scales. BNP 22 when checked today.  - he is currently on Coreg 6.25 mg twice daily, Lasix 40 mg daily, and Lisinopril 20mg  daily. Patient did not wish to be on Entresto during admission due to concerns of cost. Will plan to titrate Coreg to 12.5mg  BID. Continue Lasix at current dosing as BMET today showed stable renal function with creatinine at 1.00. He does have a BP cuff but this does not check his HR. Will arrange for a nurse visit for HR/BP check in 2 weeks. Pending readings, will plan to further titrate Coreg or Lisinopril.   2. Atypical Chest Pain - He describes feeling a "squeezing sensation" along his pectoral region and feels like he is aware of his heart pumping. Symptoms have been constant for over 24 hours and are not associated with exertion. NST last month showed no reversible ischemia as outlined above. Recent ED evaluation earlier today was reassuring as EKG showed no acute ischemic changes and troponin values were negative. I feel like the symptoms may be more indicative of palpitations.  He recently completed a ZIO patch and mailed this back yesterday. Will await the official results.    3. HTN - BP has improved since his last visit but remains elevated at 151/89.  I obtained similar results when rechecked today. He is currently on Coreg 6.25 mg twice daily and Lisinopril 20 mg daily. Will plan to titrate Coreg to 12.5 mg twice daily.  4. HLD - FLP in 03/2019 showed total cholesterol 161, triglycerides 131, HDL 49, and LDL 86. He remains on Pravastatin 40 mg daily. Followed by his PCP.   Medication Adjustments/Labs and Tests Ordered: Current medicines are reviewed at length with the patient today.   Concerns regarding medicines are outlined above.  Medication changes, Labs and Tests ordered today are listed in the Patient Instructions below. Patient Instructions  Medication Instructions:  Your physician recommends that you continue on your current medications as directed. Please refer to the Current Medication list given to you today.  Increase Coreg to 12.5 mg Two times Daily   If you need a refill on your cardiac medications before your next appointment, please call your pharmacy.   Lab work: NONE   If you have labs (blood work) drawn today and your tests are completely normal, you will receive your results only by: Marland Kitchen MyChart Message (if you have MyChart) OR . A paper copy in the mail If you have any lab test that is abnormal or we need to change your treatment, we will call you to review the results.  Testing/Procedures: NONE   Follow-Up: At [facility], you and your health needs are our priority.  As part of our continuing mission to provide you with exceptional heart care, we have created designated Provider Care Teams.  These Care Teams include your primary Cardiologist (physician) and Advanced Practice Providers (APPs -  Physician Assistants and Nurse Practitioners) who all work together to provide you with the care you need, when you need it. You will need a follow up appointment in 2 months.  Please call our office 2 months in advance to schedule this appointment.  You may see Minus Breeding, MD or one of the following Advanced Practice Providers on your designated Care Team:   Bernerd Pho, PA-C [facility]) . Ermalinda Barrios, PA-C ([place])  Any Other Special Instructions Will Be Listed  Below (If Applicable). Your physician recommends that you schedule a follow-up appointment in: 2 Weeks for Blood Pressure Check   Thank you for choosing [facility]!     Signed, Erma Heritage, PA-C  05/01/2019 5:10 PM    [facility] [address] [place], [place] [address] Phone: [phone] Fax: [phone]

## 2019-05-01 NOTE — Patient Instructions (Signed)
Medication Instructions:  Your physician recommends that you continue on your current medications as directed. Please refer to the Current Medication list given to you today.  Increase Coreg to 12.5 mg Two times Daily   If you need a refill on your cardiac medications before your next appointment, please call your pharmacy.   Lab work: NONE   If you have labs (blood work) drawn today and your tests are completely normal, you will receive your results only by: Marland Kitchen MyChart Message (if you have MyChart) OR . A paper copy in the mail If you have any lab test that is abnormal or we need to change your treatment, we will call you to review the results.  Testing/Procedures: NONE   Follow-Up: At [facility], you and your health needs are our priority.  As part of our continuing mission to provide you with exceptional heart care, we have created designated Provider Care Teams.  These Care Teams include your primary Cardiologist (physician) and Advanced Practice Providers (APPs -  Physician Assistants and Nurse Practitioners) who all work together to provide you with the care you need, when you need it. You will need a follow up appointment in 2 months.  Please call our office 2 months in advance to schedule this appointment.  You may see Minus Breeding, MD or one of the following Advanced Practice Providers on your designated Care Team:   Bernerd Pho, PA-C [facility]) . Ermalinda Barrios, PA-C ([place])  Any Other Special Instructions Will Be Listed Below (If Applicable). Your physician recommends that you schedule a follow-up appointment in: 2 Weeks for Blood Pressure Check   Thank you for choosing [facility]!

## 2019-05-01 NOTE — Discharge Instructions (Addendum)
Follow-up with cardiology and primary doctor. Return for new or worsening chest pain. Take your heart medications as previously prescribed.

## 2019-05-15 ENCOUNTER — Ambulatory Visit: Payer: Managed Care, Other (non HMO)

## 2019-05-30 ENCOUNTER — Other Ambulatory Visit: Payer: Self-pay

## 2019-06-03 ENCOUNTER — Telehealth: Payer: Self-pay | Admitting: Cardiology

## 2019-06-03 NOTE — Telephone Encounter (Signed)
New message    Pt c/o of Chest Pain: STAT if CP now or developed within 24 hours  1. Are you having CP right now? 9a this morning , still having light pressure   2. Are you experiencing any other symptoms (ex. SOB, nausea, vomiting, sweating)? Feels like his chest is being squeezed   3. How long have you been experiencing CP? Since friday  4. Is your CP continuous or coming and going? Last 2 or 3 minutes then goes away   5. Have you taken Nitroglycerin?  no ?

## 2019-06-03 NOTE — Telephone Encounter (Signed)
Spoke with pt who states that pain has been on and off since Sat. States that it feels as if something is squeezing his heart. Pt denies SOB and nausea at this time. Advised to be seen in ER.

## 2019-06-03 NOTE — Telephone Encounter (Signed)
Agree with ER evaluation   J Verneta Hamidi MD 

## 2019-06-10 ENCOUNTER — Observation Stay (HOSPITAL_COMMUNITY)
Admission: EM | Admit: 2019-06-10 | Discharge: 2019-06-11 | Disposition: A | Discharge status: Home / Self Care | Payer: Managed Care, Other (non HMO) | Attending: Internal Medicine | Admitting: Internal Medicine

## 2019-06-10 ENCOUNTER — Emergency Department (HOSPITAL_COMMUNITY): Payer: Managed Care, Other (non HMO)

## 2019-06-10 ENCOUNTER — Other Ambulatory Visit: Payer: Self-pay

## 2019-06-10 DIAGNOSIS — I509 Heart failure, unspecified: Secondary | ICD-10-CM | POA: Insufficient documentation

## 2019-06-10 DIAGNOSIS — Z87891 Personal history of nicotine dependence: Secondary | ICD-10-CM | POA: Insufficient documentation

## 2019-06-10 DIAGNOSIS — Z7984 Long term (current) use of oral hypoglycemic drugs: Secondary | ICD-10-CM | POA: Diagnosis not present

## 2019-06-10 DIAGNOSIS — R0789 Other chest pain: Secondary | ICD-10-CM | POA: Diagnosis not present

## 2019-06-10 DIAGNOSIS — I110 Hypertensive heart disease with heart failure: Secondary | ICD-10-CM | POA: Insufficient documentation

## 2019-06-10 DIAGNOSIS — E119 Type 2 diabetes mellitus without complications: Secondary | ICD-10-CM | POA: Insufficient documentation

## 2019-06-10 DIAGNOSIS — Z79899 Other long term (current) drug therapy: Secondary | ICD-10-CM | POA: Insufficient documentation

## 2019-06-10 DIAGNOSIS — Z20828 Contact with and (suspected) exposure to other viral communicable diseases: Secondary | ICD-10-CM | POA: Insufficient documentation

## 2019-06-10 LAB — BASIC METABOLIC PANEL
Anion gap: 7 (ref 5–15)
BUN: 15 mg/dL (ref 6–20)
CO2: 28 mmol/L (ref 22–32)
Calcium: 9.3 mg/dL (ref 8.9–10.3)
Chloride: 105 mmol/L (ref 98–111)
Creatinine, Ser: 1.17 mg/dL (ref 0.61–1.24)
GFR calc Af Amer: >60 mL/min (ref >60)
GFR calc non Af Amer: >60 mL/min (ref >60)
Glucose, Bld: 136 mg/dL — ABNORMAL HIGH (ref 70–99)
Potassium: 3.9 mmol/L (ref 3.5–5.1)
Sodium: 140 mmol/L (ref 135–145)

## 2019-06-10 LAB — CBC
HCT: 42.1 % (ref 39.0–52.0)
Hemoglobin: 13 g/dL (ref 13.0–17.0)
MCH: 29.2 pg (ref 26.0–34.0)
MCHC: 30.9 g/dL (ref 30.0–36.0)
MCV: 94.6 fL (ref 80.0–100.0)
Platelets: 151 10*3/uL (ref 150–400)
RBC: 4.45 MIL/uL (ref 4.22–5.81)
RDW: 15.1 % (ref 11.5–15.5)
WBC: 11 10*3/uL — ABNORMAL HIGH (ref 4.0–10.5)
nRBC: 0 % (ref 0.0–0.2)

## 2019-06-10 LAB — TROPONIN I (HIGH SENSITIVITY)
Troponin I (High Sensitivity): 4 ng/L (ref <18)
Troponin I (High Sensitivity): 4 ng/L (ref <18)

## 2019-06-10 MED ORDER — SODIUM CHLORIDE 0.9% FLUSH: 3.0000 mL | Freq: Once | INTRAVENOUS | Status: DC

## 2019-06-10 MED ORDER — ACETAMINOPHEN 325 MG PO TABS
650.0000 mg | ORAL_TABLET | Freq: Once | ORAL | Status: AC
  Administered 2019-06-10: 650 mg via ORAL
  Filled 2019-06-10: qty 2

## 2019-06-10 MED ORDER — NITROGLYCERIN 0.4 MG SL SUBL: 0.4000 mg | SUBLINGUAL_TABLET | SUBLINGUAL | Status: DC | PRN

## 2019-06-10 NOTE — ED Notes (Signed)
Pt said cp is a 7/10. Declined ntg tab. Said he would accept tylenol

## 2019-06-10 NOTE — ED Provider Notes (Signed)
[facility] EMERGENCY DEPARTMENT Provider Note   CSN: [id number] Arrival date & time: 06/10/19  1924     History   Chief Complaint Chief Complaint  Patient presents with  . Chest Pain    HPI Roberto Guerra is a 58 y.o. male.     Pt complains of chest pain with pressure.  His cardiologist sent him to the emergency department for evaluate his chest pain  The history is provided by the patient. No language interpreter was used.  Chest Pain Pain location:  Substernal area Pain quality: aching   Pain radiates to:  Does not radiate Pain severity:  Moderate Onset quality:  Sudden Timing:  Constant Progression:  Waxing and waning Chronicity:  New Context: not breathing   Relieved by:  Nothing Worsened by:  Nothing Ineffective treatments:  None tried Associated symptoms: no abdominal pain, no back pain, no cough, no fatigue and no headache     Past Medical History:  Diagnosis Date  . CHF (congestive heart failure) ([place])   . Diabetes mellitus without complication ([place])   . Gout   . Hyperlipidemia   . Hypertension   . PNA (pneumonia)     Patient Active Problem List   Diagnosis Date Noted  . Dyspnea 03/30/2019  . Tachycardia 03/30/2019  . Neuralgia of lower extremity 08/15/2012  . ERECTILE DYSFUNCTION 08/29/2008  . CERUMEN IMPACTION, BILATERAL 06/27/2008  . GOUT 09/21/2007  . Hyperlipidemia 10/12/2006  . OBESITY NOS 10/12/2006  . Essential hypertension 10/12/2006  . GERD 10/12/2006  . HIATAL HERNIA 10/12/2006    Past Surgical History:  Procedure Laterality Date  . CATARACT EXTRACTION W/PHACO Left 10/29/2018   Procedure: CATARACT EXTRACTION PHACO AND INTRAOCULAR LENS PLACEMENT (IOC);  Surgeon: Tonny Branch, MD;  Location: [facility] ORS;  Service: Ophthalmology;  Laterality: Left;  CDE: 43.37  . COLONOSCOPY N/A 02/27/2014   Procedure: COLONOSCOPY;  Surgeon: Rogene Houston, MD;  Location: [facility] SUITE;  Service: Endoscopy;  Laterality: N/A;  830  . NO PAST SURGERIES           Home Medications    Prior to Admission medications   Medication Sig Start Date End Date Taking? Authorizing Provider  allopurinol (ZYLOPRIM) 300 MG tablet Take 300 mg by mouth daily.     Yes [provider]  carvedilol (COREG) 12.5 MG tablet Take 1 tablet (12.5 mg total) by mouth 2 (two) times daily with a meal. 05/01/19 04/25/20 Yes Strader, [place] M, PA-C  furosemide (LASIX) 40 MG tablet Take 1 tablet (40 mg total) by mouth daily. 05/01/19 04/25/20 Yes Strader, Fransisco Hertz, PA-C  gabapentin (NEURONTIN) 300 MG capsule Take 300 mg by mouth 3 (three) times daily.    Yes [provider]  HYDROcodone-acetaminophen (NORCO/VICODIN) 5-325 MG tablet Take 1 tablet by mouth every 6 (six) hours as needed for severe pain.   Yes [provider]  lisinopril (ZESTRIL) 20 MG tablet Take 1 tablet (20 mg total) by mouth daily. 05/01/19 04/25/20 Yes Strader, Fransisco Hertz, PA-C  meloxicam (MOBIC) 15 MG tablet Take 15 mg by mouth daily.   Yes [provider]  metFORMIN (GLUCOPHAGE) 1000 MG tablet Take 1,000 mg by mouth 2 (two) times daily.    Yes [provider]  Omega-3 Fatty Acids (FISH OIL) 1000 MG CAPS Take 1,000 mg by mouth daily.   Yes [provider]  potassium chloride (K-DUR) 10 MEQ tablet Take 1 tablet (10 mEq total) by mouth daily. 05/01/19 04/25/20 Yes Strader, Fransisco Hertz, PA-C  pravastatin (PRAVACHOL) 40 MG tablet Take 40 mg by mouth every morning.    Yes [provider]    Family History Family History  Problem Relation Age of Onset  . Diabetes Other   . Diabetes Sister   . Colon cancer Neg Hx     Social History Social History   Tobacco Use  . Smoking status: Former Smoker    Packs/day: 0.25    Years: 1.00    Pack years: 0.25    Types: Cigarettes    Quit date: 11/30/1998    Years since quitting: 20.5  . Smokeless tobacco: Never Used  Substance Use Topics  . Alcohol use: No  . Drug use: No     Allergies   Patient  has no known allergies.   Review of Systems Review of Systems  Constitutional: Negative for appetite change and fatigue.  HENT: Negative for congestion, ear discharge and sinus pressure.   Eyes: Negative for discharge.  Respiratory: Negative for cough.   Cardiovascular: Positive for chest pain.  Gastrointestinal: Negative for abdominal pain and diarrhea.  Genitourinary: Negative for frequency and hematuria.  Musculoskeletal: Negative for back pain.  Skin: Negative for rash.  Neurological: Negative for seizures and headaches.  Psychiatric/Behavioral: Negative for hallucinations.     Physical Exam Updated Vital Signs BP 134/66   Pulse 89   Temp 98.3 F (36.8 C) (Oral)   Resp 18   Ht 5\' 6"  (1.676 m)   Wt 133.8 kg   SpO2 100%   BMI 47.61 kg/m   Physical Exam Vitals signs and nursing note reviewed.  Constitutional:      Appearance: He is well-developed.  HENT:     Head: Normocephalic.     Nose: Nose normal.  Eyes:     General: No scleral icterus.    Conjunctiva/sclera: Conjunctivae normal.  Neck:     Musculoskeletal: Neck supple.     Thyroid: No thyromegaly.  Cardiovascular:     Rate and Rhythm: Normal rate and regular rhythm.     Heart sounds: No murmur. No friction rub. No gallop.   Pulmonary:     Breath sounds: No stridor. No wheezing or rales.  Chest:     Chest wall: No tenderness.  Abdominal:     General: There is no distension.     Tenderness: There is no abdominal tenderness. There is no rebound.  Musculoskeletal: Normal range of motion.  Lymphadenopathy:     Cervical: No cervical adenopathy.  Skin:    Findings: No erythema or rash.  Neurological:     Mental Status: He is alert and oriented to person, place, and time.     Motor: No abnormal muscle tone.     Coordination: Coordination normal.  Psychiatric:        Behavior: Behavior normal.      ED Treatments / Results  Labs (all labs ordered are listed, but only abnormal results are displayed)  Labs Reviewed  BASIC METABOLIC PANEL - Abnormal; Notable for the following components:      Result Value   Glucose, Bld 136 (*)    All other components within normal limits  CBC - Abnormal; Notable for the following components:   WBC 11.0 (*)    All other components within normal limits  TROPONIN I (HIGH SENSITIVITY)  TROPONIN I (HIGH SENSITIVITY)    EKG EKG Interpretation  Date/Time:  Monday June 10 2019 19:30:26 EDT Ventricular Rate:  90 PR Interval:  184 QRS Duration: 90 QT Interval:  380 QTC Calculation: 464 R Axis:   27 Text Interpretation:  Sinus rhythm with frequent and consecutive Premature ventricular complexes Nonspecific T wave abnormality Prolonged QT Abnormal ECG Confirmed by Milton Ferguson [id number]) on 06/10/2019 10:08:45 PM Also confirmed by Milton Ferguson [phone])  on 06/10/2019 10:50:23 PM   Radiology Dg Chest 2 View  Result Date: 06/10/2019 CLINICAL DATA:  Midsternal chest pain worsening over the last several days. EXAM: CHEST - 2 VIEW COMPARISON:  Radiographs 05/01/2019 and 04/09/2019.  CT 03/23/2019. FINDINGS: The heart size and mediastinal contours are normal. The lungs are clear. There is no pleural effusion or pneumothorax. No acute osseous findings are identified. IMPRESSION: Stable chest.  No active cardiopulmonary process. Electronically Signed   By: Richardean Sale M.D.   On: 06/10/2019 19:53    Procedures Procedures (including critical care time)  Medications Ordered in ED Medications  sodium chloride flush (NS) 0.9 % injection 3 mL (has no administration in time range)  nitroGLYCERIN (NITROSTAT) SL tablet 0.4 mg (has no administration in time range)  acetaminophen (TYLENOL) tablet 650 mg (650 mg Oral Given 06/10/19 2249)     Initial Impression / Assessment and Plan / ED Course  I have reviewed the triage vital signs and the nursing notes.  Pertinent labs & imaging results that were available during my care of the patient were reviewed by me and  considered in my medical decision making (see chart for details).        Chest pain,  Troponin neg   patient with chest pain and will be admitted to medicine for further evaluation  Final Clinical Impressions(s) / ED Diagnoses   Final diagnoses:  None    ED Discharge Orders    None       Milton Ferguson, MD 06/14/19 1023

## 2019-06-10 NOTE — ED Triage Notes (Signed)
Pt c/o mid sternal chest pain that has gotten worse over the last couple of days; pt denies any radiating pain, n/v or diaphoresis; pt does c/o of some intermittent sob

## 2019-06-11 ENCOUNTER — Encounter (HOSPITAL_COMMUNITY): Payer: Self-pay

## 2019-06-11 ENCOUNTER — Other Ambulatory Visit: Payer: Self-pay

## 2019-06-11 DIAGNOSIS — R0789 Other chest pain: Secondary | ICD-10-CM

## 2019-06-11 DIAGNOSIS — R079 Chest pain, unspecified: Secondary | ICD-10-CM

## 2019-06-11 LAB — LIPID PANEL
Cholesterol: 142 mg/dL (ref 0–200)
HDL: 37 mg/dL — ABNORMAL LOW (ref >40)
LDL Cholesterol: 80 mg/dL (ref 0–99)
Total CHOL/HDL Ratio: 3.8 RATIO
Triglycerides: 126 mg/dL (ref <150)
VLDL: 25 mg/dL (ref 0–40)

## 2019-06-11 LAB — TSH: TSH: 3.729 u[IU]/mL (ref 0.350–4.500)

## 2019-06-11 LAB — TROPONIN I (HIGH SENSITIVITY): Troponin I (High Sensitivity): 3 ng/L (ref <18)

## 2019-06-11 LAB — MAGNESIUM: Magnesium: 2 mg/dL (ref 1.7–2.4)

## 2019-06-11 LAB — HEMOGLOBIN A1C
Hgb A1c MFr Bld: 6.8 % — ABNORMAL HIGH (ref 4.8–5.6)
Mean Plasma Glucose: 148.46 mg/dL

## 2019-06-11 LAB — GLUCOSE, CAPILLARY
Glucose-Capillary: 107 mg/dL — ABNORMAL HIGH (ref 70–99)
Glucose-Capillary: 107 mg/dL — ABNORMAL HIGH (ref 70–99)

## 2019-06-11 LAB — SARS CORONAVIRUS 2 BY RT PCR (HOSPITAL ORDER, PERFORMED IN ~~LOC~~ HOSPITAL LAB): SARS Coronavirus 2: NEGATIVE

## 2019-06-11 MED ORDER — PANTOPRAZOLE SODIUM 40 MG PO TBEC: 40.0000 mg | DELAYED_RELEASE_TABLET | Freq: Every day | ORAL | 1 refills | Status: DC

## 2019-06-11 MED ORDER — ALUM & MAG HYDROXIDE-SIMETH 200-200-20 MG/5ML PO SUSP
30.0000 mL | Freq: Once | ORAL | Status: AC
  Administered 2019-06-11: 30 mL via ORAL
  Filled 2019-06-11: qty 30

## 2019-06-11 MED ORDER — INSULIN ASPART 100 UNIT/ML ~~LOC~~ SOLN: 0.0000 [IU] | Freq: Three times a day (TID) | SUBCUTANEOUS | Status: DC

## 2019-06-11 MED ORDER — LISINOPRIL 10 MG PO TABS
20.0000 mg | ORAL_TABLET | Freq: Every day | ORAL | Status: DC
  Administered 2019-06-11: 20 mg via ORAL
  Filled 2019-06-11: qty 2

## 2019-06-11 MED ORDER — PRAVASTATIN SODIUM 40 MG PO TABS
40.0000 mg | ORAL_TABLET | Freq: Every morning | ORAL | Status: DC
  Administered 2019-06-11: 40 mg via ORAL
  Filled 2019-06-11: qty 1

## 2019-06-11 MED ORDER — ONDANSETRON HCL 4 MG/2ML IJ SOLN: 4.0000 mg | Freq: Four times a day (QID) | INTRAMUSCULAR | Status: DC | PRN

## 2019-06-11 MED ORDER — ALUM & MAG HYDROXIDE-SIMETH 200-200-20 MG/5ML PO SUSP
30.0000 mL | ORAL | Status: DC | PRN
  Filled 2019-06-11: qty 30

## 2019-06-11 MED ORDER — LIDOCAINE VISCOUS HCL 2 % MT SOLN
15.0000 mL | Freq: Once | OROMUCOSAL | Status: AC
  Administered 2019-06-11: 15 mL via ORAL
  Filled 2019-06-11: qty 15

## 2019-06-11 MED ORDER — FUROSEMIDE 40 MG PO TABS
40.0000 mg | ORAL_TABLET | Freq: Every day | ORAL | Status: DC
  Administered 2019-06-11: 40 mg via ORAL
  Filled 2019-06-11: qty 1

## 2019-06-11 MED ORDER — NITROGLYCERIN 0.4 MG SL SUBL: 0.4000 mg | SUBLINGUAL_TABLET | SUBLINGUAL | 3 refills | Status: DC | PRN

## 2019-06-11 MED ORDER — ENOXAPARIN SODIUM 40 MG/0.4ML ~~LOC~~ SOLN
40.0000 mg | SUBCUTANEOUS | Status: DC
  Administered 2019-06-11: 40 mg via SUBCUTANEOUS
  Filled 2019-06-11: qty 0.4

## 2019-06-11 MED ORDER — ACETAMINOPHEN 325 MG PO TABS: 650.0000 mg | ORAL_TABLET | ORAL | Status: DC | PRN

## 2019-06-11 MED ORDER — GABAPENTIN 300 MG PO CAPS
300.0000 mg | ORAL_CAPSULE | Freq: Three times a day (TID) | ORAL | Status: DC
  Administered 2019-06-11: 300 mg via ORAL
  Filled 2019-06-11: qty 1

## 2019-06-11 MED ORDER — ALLOPURINOL 300 MG PO TABS
300.0000 mg | ORAL_TABLET | Freq: Every day | ORAL | Status: DC
  Administered 2019-06-11: 300 mg via ORAL
  Filled 2019-06-11: qty 1

## 2019-06-11 MED ORDER — OMEGA-3-ACID ETHYL ESTERS 1 G PO CAPS
2.0000 g | ORAL_CAPSULE | Freq: Every day | ORAL | Status: DC
  Administered 2019-06-11: 2 g via ORAL
  Filled 2019-06-11: qty 2

## 2019-06-11 MED ORDER — CARVEDILOL 12.5 MG PO TABS
12.5000 mg | ORAL_TABLET | Freq: Two times a day (BID) | ORAL | Status: DC
  Administered 2019-06-11: 12.5 mg via ORAL
  Filled 2019-06-11: qty 1

## 2019-06-11 MED ORDER — HYDROCODONE-ACETAMINOPHEN 5-325 MG PO TABS: 1.0000 | ORAL_TABLET | Freq: Four times a day (QID) | ORAL | Status: DC | PRN

## 2019-06-11 NOTE — H&P (Signed)
TRH H&P    Patient Demographics:    Roberto Guerra, is a 58 y.o. male  MRN: [id number]  DOB - [birth year]  Admit Date - 06/10/2019  Referring MD/NP/PA: Dr. Roderic Palau  Outpatient Primary MD for the patient is Celene Squibb, MD  Patient coming from: Home  Chief complaint-chest pain   HPI:    Roberto Guerra  is a 58 y.o. male, with history of hypertension hyperlipidemia diabetes mellitus obesity CHF and gout presents with chest pain.  Patient reports pain is substernal, and feels like someone is pulling on him.  It has been getting progressively worse over several days.  He talked to his cardiologist who told him to present to the ED at least 1 day ago, patient did not come into the ED until today.  Patient reports that the pain is better with him sitting up, and nothing makes it worse.  He reports no shortness of breath with this pain.  He does have palpitations.  He denies any radiation.  He denies nausea vomiting and diaphoresis.  He denies worsening on exertion.  Patient reports that he was not exerting himself when the pain started.  Pain is been constant.  It is not worse with palpation.  Patient has a heart score of 3.  He is a former smoker, but reports that he quit 20 years ago.    ED course In the ED 2 troponins were drawn the first was 4 and the second was 3.in June 2020 he had a troponin of 4.00.  His initial EKG showed a sinus tachycardia with a rate of 127 and multiple PVCs.  Upon my exam patient heart rate sounded irregular.  The monitor was showing a sinus arrhythmia.  I had a another  twelve-lead EKG done that initially showed ventricular bigeminy the heart rate of 89 QTC of 402.  Immediately after that bigeminy was gone on the monitor so another EKG was done which showed sinus arrhythmia rate of 74 no significant ST-T changes.  Patient also had a Chem profile done in the ED. this showed a hyperglycemia of 136,  creatinine of 1.17 up from a baseline of 1.  CBC showed a white blood cell count of 11, hemoglobin of 13.0 platelets of 151.     Review of systems:    In addition to the HPI above,  No Fever-chills, No Headache, No changes with Vision or hearing, No problems swallowing food or Liquids, No Cough or Shortness of Breath, positive for Chest pain,  No Abdominal pain, No Nausea or Vomiting, bowel movements are regular, No Blood in stool or Urine, No dysuria, No new skin rashes or bruises, No new joints pains-aches,  No new weakness, tingling, numbness in any extremity, No recent weight gain or loss, No polyuria, polydypsia or polyphagia, No significant Mental Stressors.  All other systems reviewed and are negative.    Past History of the following :    Past Medical History:  Diagnosis Date   CHF (congestive heart failure) ([place])    Diabetes mellitus without complication ([place])  Gout    Hyperlipidemia    Hypertension    PNA (pneumonia)       Past Surgical History:  Procedure Laterality Date   CATARACT EXTRACTION W/PHACO Left 10/29/2018   Procedure: CATARACT EXTRACTION PHACO AND INTRAOCULAR LENS PLACEMENT (IOC);  Surgeon: Tonny Branch, MD;  Location: [facility];  Service: Ophthalmology;  Laterality: Left;  CDE: 43.37   COLONOSCOPY N/A 02/27/2014   Procedure: COLONOSCOPY;  Surgeon: Rogene Houston, MD;  Location: [facility];  Service: Endoscopy;  Laterality: N/A;  830   NO PAST SURGERIES        Social History:      Social History   Tobacco Use   Smoking status: Former Smoker    Packs/day: 0.25    Years: 1.00    Pack years: 0.25    Types: Cigarettes    Quit date: 11/30/1998    Years since quitting: 20.5   Smokeless tobacco: Never Used  Substance Use Topics   Alcohol use: No       Family History :     Family History  Problem Relation Age of Onset   Diabetes Other    Diabetes Sister    Colon cancer Neg Hx       Home Medications:   Prior  to Admission medications   Medication Sig Start Date End Date Taking? Authorizing Provider  allopurinol (ZYLOPRIM) 300 MG tablet Take 300 mg by mouth daily.     Yes [provider]  carvedilol (COREG) 12.5 MG tablet Take 1 tablet (12.5 mg total) by mouth 2 (two) times daily with a meal. 05/01/19 04/25/20 Yes Strader, [place] M, PA-C  furosemide (LASIX) 40 MG tablet Take 1 tablet (40 mg total) by mouth daily. 05/01/19 04/25/20 Yes Strader, Fransisco Hertz, PA-C  gabapentin (NEURONTIN) 300 MG capsule Take 300 mg by mouth 3 (three) times daily.    Yes [provider]  HYDROcodone-acetaminophen (NORCO/VICODIN) 5-325 MG tablet Take 1 tablet by mouth every 6 (six) hours as needed for severe pain.   Yes [provider]  lisinopril (ZESTRIL) 20 MG tablet Take 1 tablet (20 mg total) by mouth daily. 05/01/19 04/25/20 Yes Strader, Fransisco Hertz, PA-C  meloxicam (MOBIC) 15 MG tablet Take 15 mg by mouth daily.   Yes [provider]  metFORMIN (GLUCOPHAGE) 1000 MG tablet Take 1,000 mg by mouth 2 (two) times daily.    Yes [provider]  Omega-3 Fatty Acids (FISH OIL) 1000 MG CAPS Take 1,000 mg by mouth daily.   Yes [provider]  potassium chloride (K-DUR) 10 MEQ tablet Take 1 tablet (10 mEq total) by mouth daily. 05/01/19 04/25/20 Yes Strader, Fransisco Hertz, PA-C  pravastatin (PRAVACHOL) 40 MG tablet Take 40 mg by mouth every morning.    Yes [provider]     Allergies:    No Known Allergies   Physical Exam:   Vitals  Blood pressure 125/73, pulse 89, temperature 98.3 F (36.8 C), temperature source Oral, resp. rate 17, height 5\' 6"  (1.676 m), weight 133.8 kg, SpO2 100 %.  1.  General: Patient lying supine in bed with head of bed elevated to 30 degrees resting comfortably  2. Psychiatric: Intact insight and judgment  3. Neurologic: Alert and oriented x3, cranial nerves II through XII grossly intact, no focal deficits on exam  4. HEENMT:  Head is  normocephalic atraumatic, eyes are anicteric with appropriate pupil reaction.  Neck is supple with trachea midline.  5. Respiratory : Lungs are clear to  auscultation bilaterally  6. Cardiovascular : As mentioned above on exam patient's heart rate sounded irregular.  Monitor was showing sinus arrhythmia.  EKG was done that showed ventricular bigeminy.  Repeat EKG immediately after showed sinus arrhythmia.  Patient's blood pressure has been maintained.  7. Gastrointestinal:  Soft obese nondistended nontender  8. Skin:  No acute skin changes on limited skin exam      Data Review:    CBC Recent Labs  Lab 06/10/19 2019  WBC 11.0*  HGB 13.0  HCT 42.1  PLT 151  MCV 94.6  MCH 29.2  MCHC 30.9  RDW 15.1   ------------------------------------------------------------------------------------------------------------------  Results for orders placed or performed during the hospital encounter of 06/10/19 (from the past 48 hour(s))  Basic metabolic panel     Status: Abnormal   Collection Time: 06/10/19  8:19 PM  Result Value Ref Range   Sodium 140 135 - 145 mmol/L   Potassium 3.9 3.5 - 5.1 mmol/L   Chloride 105 98 - 111 mmol/L   CO2 28 22 - 32 mmol/L   Glucose, Bld 136 (H) 70 - 99 mg/dL   BUN 15 6 - 20 mg/dL   Creatinine, Ser 1.17 0.61 - 1.24 mg/dL   Calcium 9.3 8.9 - 10.3 mg/dL   GFR calc non Af Amer >60 >60 mL/min   GFR calc Af Amer >60 >60 mL/min   Anion gap 7 5 - 15    Comment: Performed at [facility], [address], [place], [place] [address]  CBC     Status: Abnormal   Collection Time: 06/10/19  8:19 PM  Result Value Ref Range   WBC 11.0 (H) 4.0 - 10.5 K/uL   RBC 4.45 4.22 - 5.81 MIL/uL   Hemoglobin 13.0 13.0 - 17.0 g/dL   HCT 42.1 39.0 - 52.0 %   MCV 94.6 80.0 - 100.0 fL   MCH 29.2 26.0 - 34.0 pg   MCHC 30.9 30.0 - 36.0 g/dL   RDW 15.1 11.5 - 15.5 %   Platelets 151 150 - 400 K/uL   nRBC 0.0 0.0 - 0.2 %    Comment: Performed at [facility], [address],  [place], [place] [address]  Troponin I (High Sensitivity)     Status: None   Collection Time: 06/10/19  8:19 PM  Result Value Ref Range   Troponin I (High Sensitivity) 4 <18 ng/L    Comment: (NOTE) Elevated high sensitivity troponin I (hsTnI) values and significant  changes across serial measurements may suggest ACS but many other  chronic and acute conditions are known to elevate hsTnI results.  Refer to the "Links" section for chest pain algorithms and additional  guidance. Performed at [facility], [address], [place], [place] [address]   Troponin I (High Sensitivity)     Status: None   Collection Time: 06/10/19  9:59 PM  Result Value Ref Range   Troponin I (High Sensitivity) 4 <18 ng/L    Comment: (NOTE) Elevated high sensitivity troponin I (hsTnI) values and significant  changes across serial measurements may suggest ACS but many other  chronic and acute conditions are known to elevate hsTnI results.  Refer to the "Links" section for chest pain algorithms and additional  guidance. Performed at [facility], [address], [place], [place] [address]   SARS Coronavirus 2 [facility] order, Performed in [facility] hospital lab) Nasopharyngeal Nasopharyngeal Swab     Status: None   Collection Time: 06/10/19 11:59 PM   Specimen: Nasopharyngeal Swab  Result Value Ref Range   SARS Coronavirus 2 NEGATIVE NEGATIVE    Comment: (NOTE) If result is NEGATIVE SARS-CoV-2 target nucleic acids are NOT DETECTED. The SARS-CoV-2 RNA is generally detectable in upper and lower  respiratory specimens during the acute phase of infection. The lowest  concentration of SARS-CoV-2 viral copies this assay can detect is 250  copies / mL. A negative result does not preclude SARS-CoV-2 infection  and should not be used as the sole basis for treatment or other  patient management decisions.  A negative result may occur with  improper specimen collection / handling, submission of specimen other  than  nasopharyngeal swab, presence of viral mutation(s) within the  areas targeted by this assay, and inadequate number of viral copies  (<250 copies / mL). A negative result must be combined with clinical  observations, patient history, and epidemiological information. If result is POSITIVE SARS-CoV-2 target nucleic acids are DETECTED. The SARS-CoV-2 RNA is generally detectable in upper and lower  respiratory specimens dur ing the acute phase of infection.  Positive  results are indicative of active infection with SARS-CoV-2.  Clinical  correlation with patient history and other diagnostic information is  necessary to determine patient infection status.  Positive results do  not rule out bacterial infection or co-infection with other viruses. If result is PRESUMPTIVE POSTIVE SARS-CoV-2 nucleic acids MAY BE PRESENT.   A presumptive positive result was obtained on the submitted specimen  and confirmed on repeat testing.  While 2019 novel coronavirus  (SARS-CoV-2) nucleic acids may be present in the submitted sample  additional confirmatory testing may be necessary for epidemiological  and / or clinical management purposes  to differentiate between  SARS-CoV-2 and other Sarbecovirus currently known to infect humans.  If clinically indicated additional testing with an alternate test  methodology [id number]) is advised. The SARS-CoV-2 RNA is generally  detectable in upper and lower respiratory sp ecimens during the acute  phase of infection. The expected result is Negative. Fact Sheet for Patients:  StrictlyIdeas.no Fact Sheet for Healthcare Providers: BankingDealers.co.za This test is not yet approved or cleared by the [place] FDA and has been authorized for detection and/or diagnosis of SARS-CoV-2 by FDA under an Emergency Use Authorization (EUA).  This EUA will remain in effect (meaning this test can be used) for the duration of  the COVID-19 declaration under Section 564(b)(1) of the Act, 21 U.S.C. section 360bbb-3(b)(1), unless the authorization is terminated or revoked sooner. Performed at [facility], [address] [place], [place] [address]   Troponin I (High Sensitivity)     Status: None   Collection Time: 06/11/19 12:04 AM  Result Value Ref Range   Troponin I (High Sensitivity) 3 <18 ng/L    Comment: (NOTE) Elevated high sensitivity troponin I (hsTnI) values and significant  changes across serial measurements may suggest ACS but many other  chronic and acute conditions are known to elevate hsTnI results.  Refer to the "Links" section for chest pain algorithms and additional  guidance. Performed at [facility], [address], [place], [place] [address]     Chemistries  Recent Labs  Lab 06/10/19 2019  NA 140  K 3.9  CL 105  CO2 28  GLUCOSE 136*  BUN 15  CREATININE 1.17  CALCIUM 9.3   ------------------------------------------------------------------------------------------------------------------  ------------------------------------------------------------------------------------------------------------------ GFR: Estimated Creatinine Clearance: 91.5 mL/min (by C-G formula based on SCr of 1.17 mg/dL). Liver Function Tests: No results for input(s): AST, ALT, ALKPHOS, BILITOT, PROT, ALBUMIN in the last  168 hours. No results for input(s): LIPASE, AMYLASE in the last 168 hours. No results for input(s): AMMONIA in the last 168 hours. Coagulation Profile: No results for input(s): INR, PROTIME in the last 168 hours. Cardiac Enzymes: No results for input(s): CKTOTAL, CKMB, CKMBINDEX, TROPONINI in the last 168 hours. BNP (last 3 results) No results for input(s): PROBNP in the last 8760 hours. HbA1C: No results for input(s): HGBA1C in the last 72 hours. CBG: No results for input(s): GLUCAP in the last 168 hours. Lipid Profile: No results for input(s): CHOL, HDL, LDLCALC, TRIG, CHOLHDL,  LDLDIRECT in the last 72 hours. Thyroid Function Tests: No results for input(s): TSH, T4TOTAL, FREET4, T3FREE, THYROIDAB in the last 72 hours. Anemia Panel: No results for input(s): VITAMINB12, FOLATE, FERRITIN, TIBC, IRON, RETICCTPCT in the last 72 hours.  --------------------------------------------------------------------------------------------------------------- Urine analysis:    Component Value Date/Time   COLORURINE yellow 06/27/2008 1549   APPEARANCEUR Clear 06/27/2008 1549   LABSPEC 1.020 06/27/2008 1549   PHURINE 5.0 06/27/2008 1549   HGBUR negative 06/27/2008 1549   BILIRUBINUR negative 06/27/2008 1549   UROBILINOGEN 2.0 06/27/2008 1549   NITRITE negative 06/27/2008 1549      Imaging Results:    Dg Chest 2 View  Result Date: 06/10/2019 CLINICAL DATA:  Midsternal chest pain worsening over the last several days. EXAM: CHEST - 2 VIEW COMPARISON:  Radiographs 05/01/2019 and 04/09/2019.  CT 03/23/2019. FINDINGS: The heart size and mediastinal contours are normal. The lungs are clear. There is no pleural effusion or pneumothorax. No acute osseous findings are identified. IMPRESSION: Stable chest.  No active cardiopulmonary process. Electronically Signed   By: Richardean Sale M.D.   On: 06/10/2019 19:53       Assessment & Plan:    Active Problems:   Chest pain   1. Chest pain rule out ACS 1. Chest pain improved at this time. 2. Troponins are so far normal 3. Last echo done in May 2020 -study was inadequate for wall motion analysis.  LVEF 40 to 45%.  Mild concentric left ventricular hypertrophy.  Mitral annular calcification present. 4. Patient has multiple risk factors for ACS including obesity, diabetes, hyperlipidemia, former smoker, hypertension.  Patient reports no family history of CAD. 5. 1 more troponin to complete a 3 tiered trend.  EKG to be repeated in the morning.  Continue oxygen therapy, aspirin, beta-blocker, statin in the meantime. 2. Sinus  arrhythmia 1. Continue to monitor on telemetry.  May consider cardiology consult in the morning. 3. Hyperglycemia 1. Last hemoglobin A1c was December 19 of 2019.  It was at goal at 6.6.  Holding patient's oral hypoglycemics, add sliding scale coverage.  Monitor sugars with Accu-Cheks.  Carb modified heart healthy diet.   DVT Prophylaxis-   Lovenox - SCDs  AM Labs Ordered, also please review Full Orders  Family Communication: Admission, patients condition and plan of care including tests being ordered have been discussed with the patient who verbalizes understanding and agrees with the plan and Code Status.  Code Status: Full  Admission status: Observation Based on patients clinical presentation and evaluation of above clinical data, I have made determination that patient meets observation criteria at this time  Time spent in minutes : 59   Yahir Tavano B Zierle-Ghosh M.D on 06/11/2019 at 2:15 AM

## 2019-06-11 NOTE — Discharge Summary (Signed)
Physician Discharge Summary  Roberto Guerra TGG:[id number] DOB: Sep 08, 1961 DOA: 06/10/2019  PCP: Celene Squibb, MD  Admit date: 06/10/2019  Discharge date: 06/11/2019  Admitted From:Home  Disposition:  Home  Recommendations for Outpatient Follow-up:  1. Follow up with PCP in 1-2 weeks 2. Follow-up with cardiology as scheduled on 8/24 3. Nitroglycerin prescribed as needed for chest pain 4. Patient appears to have some symptoms of atypical chest pain related to GI issues for which I have prescribed Protonix daily to try  Home Health: None  Equipment/Devices: None  Discharge Condition: Stable  CODE STATUS: Full  Diet recommendation: Heart Healthy/carb modified  Brief/Interim Summary: Per HPI: Roberto Guerra  is a 58 y.o. male, with history of hypertension hyperlipidemia diabetes mellitus obesity CHF and gout presents with chest pain.  Patient reports pain is substernal, and feels like someone is pulling on him.  It has been getting progressively worse over several days.  He talked to his cardiologist who told him to present to the ED at least 1 day ago, patient did not come into the ED until today.  Patient reports that the pain is better with him sitting up, and nothing makes it worse.  He reports no shortness of breath with this pain.  He does have palpitations.  He denies any radiation.  He denies nausea vomiting and diaphoresis.  He denies worsening on exertion.  Patient reports that he was not exerting himself when the pain started.  Pain is been constant.  It is not worse with palpation.  Patient has a heart score of 3.  He is a former smoker, but reports that he quit 20 years ago.  Patient has had negative troponin trend and this has remained flat.  Prior echo was done in 03/2019 with LVEF 40-45% and was not repeated during this hospitalization.  EKG with some sinus arrhythmia, but no other acute abnormalities to suggest ACS.  He is without any further chest pain or shortness of breath or  other symptoms this morning and does state that he had some burning sensation with his chest pain and would like to try some Protonix to help alleviate some potential acid reflux that could be contributing to his atypical chest pain.  This will be prescribed for him on discharge and he will follow-up with cardiology as noted above.  Discharge Diagnoses:  Active Problems:   Atypical chest pain  Principal discharge diagnosis: Atypical chest pain likely of GI origin.  Discharge Instructions  Discharge Instructions    Diet - low sodium heart healthy   Complete by: As directed    Increase activity slowly   Complete by: As directed      Allergies as of 06/11/2019   No Known Allergies     Medication List    TAKE these medications   allopurinol 300 MG tablet Commonly known as: ZYLOPRIM Take 300 mg by mouth daily.   carvedilol 12.5 MG tablet Commonly known as: COREG Take 1 tablet (12.5 mg total) by mouth 2 (two) times daily with a meal.   Fish Oil 1000 MG Caps Take 1,000 mg by mouth daily.   furosemide 40 MG tablet Commonly known as: LASIX Take 1 tablet (40 mg total) by mouth daily.   gabapentin 300 MG capsule Commonly known as: NEURONTIN Take 300 mg by mouth 3 (three) times daily.   HYDROcodone-acetaminophen 5-325 MG tablet Commonly known as: NORCO/VICODIN Take 1 tablet by mouth every 6 (six) hours as needed for severe pain.   lisinopril  20 MG tablet Commonly known as: ZESTRIL Take 1 tablet (20 mg total) by mouth daily.   meloxicam 15 MG tablet Commonly known as: MOBIC Take 15 mg by mouth daily.   metFORMIN 1000 MG tablet Commonly known as: GLUCOPHAGE Take 1,000 mg by mouth 2 (two) times daily.   nitroGLYCERIN 0.4 MG SL tablet Commonly known as: NITROSTAT Place 1 tablet (0.4 mg total) under the tongue every 5 (five) minutes as needed for chest pain (CP or SOB).   pantoprazole 40 MG tablet Commonly known as: Protonix Take 1 tablet (40 mg total) by mouth daily.    potassium chloride 10 MEQ tablet Commonly known as: K-DUR Take 1 tablet (10 mEq total) by mouth daily.   pravastatin 40 MG tablet Commonly known as: PRAVACHOL Take 40 mg by mouth every morning.      Follow-up Information    Imogene Burn, PA-C Follow up on 07/01/2019.   Specialty: Cardiology Why: Cardiology Hospital Follow-up on 07/01/2019 at 12:30 PM.  Contact information: [place] [address] [phone]        Celene Squibb, MD Follow up in 2 week(s).   Specialty: Internal Medicine Contact information: [address] [place] [facility] [address] [phone]        Minus Breeding, MD .   Specialty: Cardiology Contact information: [place] [address] [phone]          No Known Allergies  Consultations:  None   Procedures/Studies: Dg Chest 2 View  Result Date: 06/10/2019 CLINICAL DATA:  Midsternal chest pain worsening over the last several days. EXAM: CHEST - 2 VIEW COMPARISON:  Radiographs 05/01/2019 and 04/09/2019.  CT 03/23/2019. FINDINGS: The heart size and mediastinal contours are normal. The lungs are clear. There is no pleural effusion or pneumothorax. No acute osseous findings are identified. IMPRESSION: Stable chest.  No active cardiopulmonary process. Electronically Signed   By: Richardean Sale M.D.   On: 06/10/2019 19:53      Discharge Exam: Vitals:   06/11/19 0802 06/11/19 0952  BP: (!) 173/101 (!) 148/87  Pulse: (!) 44 76  Resp: 17   Temp: 98.4 F (36.9 C)   SpO2: 96%    Vitals:   06/11/19 0300 06/11/19 0350 06/11/19 0802 06/11/19 0952  BP: 109/69 (!) 144/92 (!) 173/101 (!) 148/87  Pulse:  68 (!) 44 76  Resp: 14 20 17    Temp:  98.1 F (36.7 C) 98.4 F (36.9 C)   TempSrc:  Oral Oral   SpO2: 97% 98% 96%   Weight:  (!) 137.8 kg    Height:  5\' 6"  (1.676 m)      General: Pt is alert, awake, not in acute distress, obese Cardiovascular: RRR, S1/S2 +, no rubs, no gallops Respiratory: CTA  bilaterally, no wheezing, no rhonchi Abdominal: Soft, NT, ND, bowel sounds + Extremities: no edema, no cyanosis    The results of significant diagnostics from this hospitalization (including imaging, microbiology, ancillary and laboratory) are listed below for reference.     Microbiology: Recent Results (from the past 240 hour(s))  SARS Coronavirus 2 [facility] order, Performed in [facility] hospital lab) Nasopharyngeal Nasopharyngeal Swab     Status: None   Collection Time: 06/10/19 11:59 PM   Specimen: Nasopharyngeal Swab  Result Value Ref Range Status   SARS Coronavirus 2 NEGATIVE NEGATIVE Final    Comment: (NOTE) If result is NEGATIVE SARS-CoV-2 target nucleic acids are NOT DETECTED. The SARS-CoV-2 RNA is generally detectable  in upper and lower  respiratory specimens during the acute phase of infection. The lowest  concentration of SARS-CoV-2 viral copies this assay can detect is 250  copies / mL. A negative result does not preclude SARS-CoV-2 infection  and should not be used as the sole basis for treatment or other  patient management decisions.  A negative result may occur with  improper specimen collection / handling, submission of specimen other  than nasopharyngeal swab, presence of viral mutation(s) within the  areas targeted by this assay, and inadequate number of viral copies  (<250 copies / mL). A negative result must be combined with clinical  observations, patient history, and epidemiological information. If result is POSITIVE SARS-CoV-2 target nucleic acids are DETECTED. The SARS-CoV-2 RNA is generally detectable in upper and lower  respiratory specimens dur ing the acute phase of infection.  Positive  results are indicative of active infection with SARS-CoV-2.  Clinical  correlation with patient history and other diagnostic information is  necessary to determine patient infection status.  Positive results do  not rule out bacterial infection or co-infection  with other viruses. If result is PRESUMPTIVE POSTIVE SARS-CoV-2 nucleic acids MAY BE PRESENT.   A presumptive positive result was obtained on the submitted specimen  and confirmed on repeat testing.  While 2019 novel coronavirus  (SARS-CoV-2) nucleic acids may be present in the submitted sample  additional confirmatory testing may be necessary for epidemiological  and / or clinical management purposes  to differentiate between  SARS-CoV-2 and other Sarbecovirus currently known to infect humans.  If clinically indicated additional testing with an alternate test  methodology [phone]) is advised. The SARS-CoV-2 RNA is generally  detectable in upper and lower respiratory sp ecimens during the acute  phase of infection. The expected result is Negative. Fact Sheet for Patients:  StrictlyIdeas.no Fact Sheet for Healthcare Providers: BankingDealers.co.za This test is not yet approved or cleared by the [place] FDA and has been authorized for detection and/or diagnosis of SARS-CoV-2 by FDA under an Emergency Use Authorization (EUA).  This EUA will remain in effect (meaning this test can be used) for the duration of the COVID-19 declaration under Section 564(b)(1) of the Act, 21 U.S.C. section 360bbb-3(b)(1), unless the authorization is terminated or revoked sooner. Performed at [facility], [address] [place], [place] [address]      Labs: BNP (last 3 results) Recent Labs    03/30/19 0205 05/01/19 0732  BNP 79.0 27.7   Basic Metabolic Panel: Recent Labs  Lab 06/10/19 2019  NA 140  K 3.9  CL 105  CO2 28  GLUCOSE 136*  BUN 15  CREATININE 1.17  CALCIUM 9.3  MG 2.0   Liver Function Tests: No results for input(s): AST, ALT, ALKPHOS, BILITOT, PROT, ALBUMIN in the last 168 hours. No results for input(s): LIPASE, AMYLASE in the last 168 hours. No results for input(s): AMMONIA in the last 168 hours. CBC: Recent Labs  Lab  06/10/19 2019  WBC 11.0*  HGB 13.0  HCT 42.1  MCV 94.6  PLT 151   Cardiac Enzymes: No results for input(s): CKTOTAL, CKMB, CKMBINDEX, TROPONINI in the last 168 hours. BNP: Invalid input(s): POCBNP CBG: Recent Labs  Lab 06/11/19 0759  GLUCAP 107*   D-Dimer No results for input(s): DDIMER in the last 72 hours. Hgb A1c No results for input(s): HGBA1C in the last 72 hours. Lipid Profile Recent Labs    06/11/19 0004  CHOL 142  HDL 37*  LDLCALC 80  TRIG 126  CHOLHDL 3.8   Thyroid function studies Recent Labs    06/10/19 2019  TSH 3.729   Anemia work up No results for input(s): VITAMINB12, FOLATE, FERRITIN, TIBC, IRON, RETICCTPCT in the last 72 hours. Urinalysis    Component Value Date/Time   COLORURINE yellow 06/27/2008 1549   APPEARANCEUR Clear 06/27/2008 1549   LABSPEC 1.020 06/27/2008 1549   PHURINE 5.0 06/27/2008 1549   HGBUR negative 06/27/2008 1549   BILIRUBINUR negative 06/27/2008 1549   UROBILINOGEN 2.0 06/27/2008 1549   NITRITE negative 06/27/2008 1549   Sepsis Labs Invalid input(s): PROCALCITONIN,  WBC,  LACTICIDVEN Microbiology Recent Results (from the past 240 hour(s))  SARS Coronavirus 2 [facility] order, Performed in [facility] hospital lab) Nasopharyngeal Nasopharyngeal Swab     Status: None   Collection Time: 06/10/19 11:59 PM   Specimen: Nasopharyngeal Swab  Result Value Ref Range Status   SARS Coronavirus 2 NEGATIVE NEGATIVE Final    Comment: (NOTE) If result is NEGATIVE SARS-CoV-2 target nucleic acids are NOT DETECTED. The SARS-CoV-2 RNA is generally detectable in upper and lower  respiratory specimens during the acute phase of infection. The lowest  concentration of SARS-CoV-2 viral copies this assay can detect is 250  copies / mL. A negative result does not preclude SARS-CoV-2 infection  and should not be used as the sole basis for treatment or other  patient management decisions.  A negative result may occur with  improper specimen  collection / handling, submission of specimen other  than nasopharyngeal swab, presence of viral mutation(s) within the  areas targeted by this assay, and inadequate number of viral copies  (<250 copies / mL). A negative result must be combined with clinical  observations, patient history, and epidemiological information. If result is POSITIVE SARS-CoV-2 target nucleic acids are DETECTED. The SARS-CoV-2 RNA is generally detectable in upper and lower  respiratory specimens dur ing the acute phase of infection.  Positive  results are indicative of active infection with SARS-CoV-2.  Clinical  correlation with patient history and other diagnostic information is  necessary to determine patient infection status.  Positive results do  not rule out bacterial infection or co-infection with other viruses. If result is PRESUMPTIVE POSTIVE SARS-CoV-2 nucleic acids MAY BE PRESENT.   A presumptive positive result was obtained on the submitted specimen  and confirmed on repeat testing.  While 2019 novel coronavirus  (SARS-CoV-2) nucleic acids may be present in the submitted sample  additional confirmatory testing may be necessary for epidemiological  and / or clinical management purposes  to differentiate between  SARS-CoV-2 and other Sarbecovirus currently known to infect humans.  If clinically indicated additional testing with an alternate test  methodology [phone]) is advised. The SARS-CoV-2 RNA is generally  detectable in upper and lower respiratory sp ecimens during the acute  phase of infection. The expected result is Negative. Fact Sheet for Patients:  StrictlyIdeas.no Fact Sheet for Healthcare Providers: BankingDealers.co.za This test is not yet approved or cleared by the [place] FDA and has been authorized for detection and/or diagnosis of SARS-CoV-2 by FDA under an Emergency Use Authorization (EUA).  This EUA will remain in effect  (meaning this test can be used) for the duration of the COVID-19 declaration under Section 564(b)(1) of the Act, 21 U.S.C. section 360bbb-3(b)(1), unless the authorization is terminated or revoked sooner. Performed at [facility], [address], [place], [place] [address]      Time coordinating discharge: 35 minutes  SIGNED:   Ariam Mol D  Manuella Ghazi, DO [facility] 06/11/2019, 10:47 AM  If 7PM-7AM, please contact night-coverage [email or web address] Password [email or web address]

## 2019-06-11 NOTE — Progress Notes (Signed)
Nsg Discharge Note  Admit Date:  06/10/2019 Discharge date: 06/11/2019   Baird Kay to be D/C'd Home per MD order.  AVS completed. Patient able to verbalize understanding.  Discharge Medication: Allergies as of 06/11/2019   No Known Allergies     Medication List    TAKE these medications   allopurinol 300 MG tablet Commonly known as: ZYLOPRIM Take 300 mg by mouth daily.   carvedilol 12.5 MG tablet Commonly known as: COREG Take 1 tablet (12.5 mg total) by mouth 2 (two) times daily with a meal.   Fish Oil 1000 MG Caps Take 1,000 mg by mouth daily.   furosemide 40 MG tablet Commonly known as: LASIX Take 1 tablet (40 mg total) by mouth daily.   gabapentin 300 MG capsule Commonly known as: NEURONTIN Take 300 mg by mouth 3 (three) times daily.   HYDROcodone-acetaminophen 5-325 MG tablet Commonly known as: NORCO/VICODIN Take 1 tablet by mouth every 6 (six) hours as needed for severe pain.   lisinopril 20 MG tablet Commonly known as: ZESTRIL Take 1 tablet (20 mg total) by mouth daily.   meloxicam 15 MG tablet Commonly known as: MOBIC Take 15 mg by mouth daily.   metFORMIN 1000 MG tablet Commonly known as: GLUCOPHAGE Take 1,000 mg by mouth 2 (two) times daily.   nitroGLYCERIN 0.4 MG SL tablet Commonly known as: NITROSTAT Place 1 tablet (0.4 mg total) under the tongue every 5 (five) minutes as needed for chest pain (CP or SOB).   pantoprazole 40 MG tablet Commonly known as: Protonix Take 1 tablet (40 mg total) by mouth daily.   potassium chloride 10 MEQ tablet Commonly known as: K-DUR Take 1 tablet (10 mEq total) by mouth daily.   pravastatin 40 MG tablet Commonly known as: PRAVACHOL Take 40 mg by mouth every morning.       Discharge Assessment: Vitals:   06/11/19 0802 06/11/19 0952  BP: (!) 173/101 (!) 148/87  Pulse: (!) 44 76  Resp: 17   Temp: 98.4 F (36.9 C)   SpO2: 96%    Skin clean, dry and intact without evidence of skin break down, no  evidence of skin tears noted. IV catheter discontinued intact. Site without signs and symptoms of complications - no redness or edema noted at insertion site, patient denies c/o pain - only slight tenderness at site.  Dressing with slight pressure applied.  D/c Instructions-Education: Discharge instructions given to patient with verbalized understanding. D/c education completed with patient including follow up instructions, medication list, d/c activities limitations if indicated, with other d/c instructions as indicated by MD - patient able to verbalize understanding, all questions fully answered. Patient instructed to return to ED, call 911, or call MD for any changes in condition.  Patient escorted via [place], and D/C home via private auto.  Aaryanna Hyden C, RN 06/11/2019 11:25 AM

## 2019-06-11 NOTE — ED Notes (Signed)
EKG completed and given to hospitalist

## 2019-07-01 ENCOUNTER — Ambulatory Visit: Payer: Managed Care, Other (non HMO) | Admitting: Physician Assistant

## 2019-07-05 ENCOUNTER — Encounter: Payer: Self-pay | Admitting: Student

## 2019-07-05 ENCOUNTER — Ambulatory Visit (INDEPENDENT_AMBULATORY_CARE_PROVIDER_SITE_OTHER): Payer: Managed Care, Other (non HMO) | Admitting: Student

## 2019-07-05 ENCOUNTER — Other Ambulatory Visit: Payer: Self-pay

## 2019-07-05 VITALS — BP 137/81 | HR 82 | Ht 67.0 in | Wt 303.0 lb

## 2019-07-05 DIAGNOSIS — I10 Essential (primary) hypertension: Secondary | ICD-10-CM | POA: Diagnosis not present

## 2019-07-05 DIAGNOSIS — R0789 Other chest pain: Secondary | ICD-10-CM | POA: Diagnosis not present

## 2019-07-05 DIAGNOSIS — E785 Hyperlipidemia, unspecified: Secondary | ICD-10-CM

## 2019-07-05 DIAGNOSIS — R000 Tachycardia, unspecified: Secondary | ICD-10-CM | POA: Diagnosis not present

## 2019-07-05 DIAGNOSIS — I5042 Chronic combined systolic (congestive) and diastolic (congestive) heart failure: Secondary | ICD-10-CM | POA: Diagnosis not present

## 2019-07-05 NOTE — Progress Notes (Signed)
Cardiology Office Note    Date:  07/06/2019   ID:  Roberto Guerra, DOB [birth year], MRN [id number]  PCP:  Celene Squibb, MD  Cardiologist: Minus Breeding, MD  --> wishes to switch to Dr. Harl Bowie and the [facility] due to transportation issues  Chief Complaint  Patient presents with   Hospitalization Follow-up    History of Present Illness:    Roberto Guerra is a 58 y.o. male with past medical history of chronic combined systolic and diastolic CHF (EF [id number] in 03/2019 with NST showing no reversible ischemia), HTN, HLD, and Type 2 DM who presents to the office today for hospital follow-up.  He was last examined by myself in 04/2019 and reported a squeezing sensation along his pectoral region which had been constant since the previous day and had been evaluated in the ED with troponin values being negative and EKG showing no acute ischemic changes.Symptoms were thought to be most consistent with palpitations and he already had a ZIO patch in place but the results were pending. Was taking Coreg 6.25 mg twice daily, Lasix 40 mg daily, and Lisinopril 20 mg daily at the time of his visit and Coreg was titrated to 12.5 mg twice daily. Cardiac Monitor did show occasional PAC's and PVC's with an isolated 4 beat run of NSVT.   In the interim, he was admitted to [facility] from 8/3 - 06/11/2019 for evaluation of chest pain which had been constant for the past few days leading to admission. EKG showed no acute ischemic changes and HS Troponin values were negative. It was thought that his symptoms might be secondary to reflux and he was started on Protonix.  In talking with the patient today, he reports having episodes of feeling like his "heart is working hard".  He denies specific pain but says he does feel a bloating sensation along his sternal and pectoral region if he presses in on his stomach. He has been trying to walk up to 0.5 miles per day for exercise and says this can trigger his episodes  but at other times his episodes are triggered by food consumption. He reports having dyspnea on exertion which has overall been unchanged. No recent orthopnea, PND, or lower extremity edema.  He does have a blood pressure cuff at home and this has overall been well controlled.  At 137/81 during today's visit.  Past Medical History:  Diagnosis Date   CHF (congestive heart failure) ([place])    a. EF 40-45% in 03/2019 with NST showing no reversible ischemia   Diabetes mellitus without complication (HCC)    Gout    Hyperlipidemia    Hypertension    PNA (pneumonia)     Past Surgical History:  Procedure Laterality Date   CATARACT EXTRACTION W/PHACO Left 10/29/2018   Procedure: CATARACT EXTRACTION PHACO AND INTRAOCULAR LENS PLACEMENT ([place]);  Surgeon: Tonny Branch, MD;  Location: [facility] OR[facility];  Service: Ophthalmology;  Laterality: Left;  CDE: 43.37   COLONOSCOPY N/A 02/27/2014   Procedure: COLONOSCOPY;  Surgeon: Rogene Houston, MD;  Location: [facility] END[facility] SU[facility];  Service: Endoscopy;  Laterality: N/A;  830   NO PAST SURGERIES      Current Medications: Outpatient Medications Prior to Visit  Medication Sig Dispense Refill   allopurinol (ZYLOPRIM) 300 MG tablet Take 300 mg by mouth daily.       carvedilol (COREG) 12.5 MG tablet Take 1 tablet (12.5 mg total) by mouth 2 (two) times daily with a meal. 180 tablet 3  furosemide (LASIX) 40 MG tablet Take 1 tablet (40 mg total) by mouth daily. 90 tablet 3   gabapentin (NEURONTIN) 300 MG capsule Take 300 mg by mouth 3 (three) times daily.      HYDROcodone-acetaminophen (NORCO/VICODIN) 5-325 MG tablet Take 1 tablet by mouth every 6 (six) hours as needed for severe pain.     lisinopril (ZESTRIL) 20 MG tablet Take 1 tablet (20 mg total) by mouth daily. 90 tablet 3   meloxicam (MOBIC) 15 MG tablet Take 15 mg by mouth daily.     metFORMIN (GLUCOPHAGE) 1000 MG tablet Take 1,000 mg by mouth 2 (two) times daily.      nitroGLYCERIN (NITROSTAT) 0.4 MG SL  tablet Place 1 tablet (0.4 mg total) under the tongue every 5 (five) minutes as needed for chest pain (CP or SOB). 30 tablet 3   Omega-3 Fatty Acids (FISH OIL) 1000 MG CAPS Take 1,000 mg by mouth daily.     pantoprazole (PROTONIX) 40 MG tablet Take 1 tablet (40 mg total) by mouth daily. 30 tablet 1   potassium chloride (K-DUR) 10 MEQ tablet Take 1 tablet (10 mEq total) by mouth daily. 90 tablet 3   pravastatin (PRAVACHOL) 40 MG tablet Take 40 mg by mouth every morning.      No facility-administered medications prior to visit.      Allergies:   Patient has no known allergies.   Social History   Socioeconomic History   Marital status: Married    Spouse name: Not on file   Number of children: Not on file   Years of education: 11   Highest education level: Not on file  Occupational History    Employer: [employer] resource strain: Not on file   Food insecurity    Worry: Not on file    Inability: Not on file   Transportation needs    Medical: Not on file    Non-medical: Not on file  Tobacco Use   Smoking status: Former Smoker    Packs/day: 0.25    Years: 1.00    Pack years: 0.25    Types: Cigarettes    Quit date: 11/30/1998    Years since quitting: 20.6   Smokeless tobacco: Never Used  Substance and Sexual Activity   Alcohol use: No   Drug use: No   Sexual activity: Not on file  Lifestyle   Physical activity    Days per week: Not on file    Minutes per session: Not on file   Stress: Not on file  Relationships   Social connections    Talks on phone: Not on file    Gets together: Not on file    Attends religious service: Not on file    Active member of club or organization: Not on file    Attends meetings of clubs or organizations: Not on file    Relationship status: Not on file  Other Topics Concern   Not on file  Social History Narrative   Lives with wife.  Daughter at home.  Supervisor.       Family History:   The patient's family history includes Diabetes in his sister and another family member.   Review of Systems:   Please see the history of present illness.     General:  No chills, fever, night sweats or weight changes.  Cardiovascular:  No edema, orthopnea, paroxysmal nocturnal dyspnea. Positive for chest pain, palpitations, and dyspnea.  Dermatological: No rash, lesions/masses  Respiratory: No cough, dyspnea Urologic: No hematuria, dysuria Abdominal:   No nausea, vomiting, diarrhea, bright red blood per rectum, melena, or hematemesis Neurologic:  No visual changes, wkns, changes in mental status. All other systems reviewed and are otherwise negative except as noted above.   Physical Exam:    VS:  BP 137/81    Pulse 82    Ht 5\' 7"  (1.702 m)    Wt (!) 303 lb (137.4 kg)    BMI 47.46 kg/m    General: Well developed, well nourished,male appearing in no acute distress. Head: Normocephalic, atraumatic, sclera non-icteric, no xanthomas, nares are without discharge.  Neck: No carotid bruits. JVD not elevated.  Lungs: Respirations regular and unlabored, without wheezes or rales.  Heart: Regular rate and rhythm. No S3 or S4.  No murmur, no rubs, or gallops appreciated. Abdomen: Soft, non-tender, non-distended with normoactive bowel sounds. No hepatomegaly. No rebound/guarding. No obvious abdominal masses. Msk:  Strength and tone appear normal for age. No joint deformities or effusions. Extremities: No clubbing or cyanosis. No lower extremity edema.  Distal pedal pulses are 2+ bilaterally. Neuro: Alert and oriented X 3. Moves all extremities spontaneously. No focal deficits noted. Psych:  Responds to questions appropriately with a normal affect. Skin: No rashes or lesions noted  Wt Readings from Last 3 Encounters:  07/05/19 (!) 303 lb (137.4 kg)  06/11/19 (!) 303 lb 12.7 oz (137.8 kg)  05/01/19 (!) 301 lb (136.5 kg)    Studies/Labs Reviewed:   EKG:  EKG is not ordered today.   Recent  Labs: 03/31/2019: ALT 31 05/01/2019: B Natriuretic Peptide 22.0 06/10/2019: BUN 15; Creatinine, Ser 1.17; Hemoglobin 13.0; Magnesium 2.0; Platelets 151; Potassium 3.9; Sodium 140; TSH 3.729   Lipid Panel    Component Value Date/Time   CHOL 142 06/11/2019 0004   TRIG 126 06/11/2019 0004   HDL 37 (L) 06/11/2019 0004   CHOLHDL 3.8 06/11/2019 0004   VLDL 25 06/11/2019 0004   LDLCALC 80 06/11/2019 0004    Additional studies/ records that were reviewed today include:   Echocardiogram: 03/2019 IMPRESSIONS   1. The left ventricle has mild-moderately reduced systolic function, with an ejection fraction of 40-45%. The cavity size was normal. There is mild concentric left ventricular hypertrophy. Left ventricular diastolic Doppler parameters are consistent  with pseudonormalization. Elevated mean left atrial pressure.  2. The right ventricle has normal systolic function. The cavity was normal. There is no increase in right ventricular wall thickness. Right ventricular systolic pressure could not be assessed.  3. There is mild mitral annular calcification present.  4. The tricuspid valve is grossly normal.   Event Monitor: 04/2019  14 day monitor  Occasional PVCs, rare couplets. Isolated 4 beat run of NSVT  Occasioanl PACs, rare couplets and triplets  No symptoms reported  Assessment:    1. Chronic combined systolic and diastolic heart failure ([place])   2. Atypical chest pain   3. Tachycardia   4. Essential hypertension   5. Hyperlipidemia, unspecified hyperlipidemia type      Plan:   In order of problems listed above:  1. Chronic Combined Systolic and Diastolic CHF - he has a known reduced EF of 40-45% in 03/2019 with NST showing no reversible ischemia. 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  - he denies any recent orthopnea, PND, or edema. Weight has been stable on his home scales.  - continue current regimen with Coreg 12.5mg  BID, Lisinopril 20mg  daily,  and Lasix 40mg  daily. He is  due for a repeat echocardiogram for reassessment so will obtain a limited echo to reassess EF. If this remains reduced, would further titrate Coreg to 25mg  BID. He wishes to not start Entresto unless absolutely necessary due to financial concerns and prefers generics. Once on max dose of Coreg, would consider addition of Spironolactone if BP allows.   2. Atypical Chest Pain - episodes overall seem atypical as they can occur at times with food consumption or him pressing on his stomach. However, sometimes triggered by exertion. NST in 03/2019 showed no reversible ischemia but balanced ischemia could not be entirely excluded. Will recheck echo for assessment of EF. If improved, would not anticipate further ischemic testing at this time and may benefit from GI referral. If EF has further declined, would discuss options including cardiac catheterization for definitive evaluation.   3. Tachycardia - recent monitor showed PAC's and PVC's but no significant arrhythmias. Telemetry during recent admission showed PAC's and PVC's as well. TSH and electrolytes WNL.  - continue Coreg 12.5mg  BID at this time with possible titration as outlined above.   4. HTN - BP at 137/81 during today's visit. Continue current medication regimen.   5. HLD - FLP earlier this month showed total cholesterol of 142, HDL 37, and LDL 80. Continue Pravastatin 40mg  daily.   Medication Adjustments/Labs and Tests Ordered: Current medicines are reviewed at length with the patient today.  Concerns regarding medicines are outlined above.  Medication changes, Labs and Tests ordered today are listed in the Patient Instructions below. Patient Instructions  Medication Instructions:  Your physician recommends that you continue on your current medications as directed. Please refer to the Current Medication list given to you today.  If you need a refill on your cardiac medications before your next appointment,  please call your pharmacy.   Lab work: NONE   If you have labs (blood work) drawn today and your tests are completely normal, you will receive your results only by:  [place] (if you have MyChart) OR  A paper copy in the mail If you have any lab test that is abnormal or we need to change your treatment, we will call you to review the results.  Testing/Procedures: NONE   Follow-Up: At [facility], you and your health needs are our priority.  As part of our continuing mission to provide you with exceptional heart care, we have created designated Provider Care Teams.  These Care Teams include your primary Cardiologist (physician) and Advanced Practice Providers (APPs -  Physician Assistants and Nurse Practitioners) who all work together to provide you with the care you need, when you need it. You will need a follow up appointment in 1 months.  Please call our office 2 months in advance to schedule this appointment.  You may see Minus Breeding, MD or one of the following Advanced Practice Providers on your designated Care Team:   Bernerd Pho, PA-C ([place])  Ermalinda Barrios, PA-C [facility])  Any Other Special Instructions Will Be Listed Below (If Applicable). Thank you for choosing [facility]!     Signed, Erma Heritage, PA-C  07/06/2019 10:55 AM    [place] [address] [place], [place] [address] Phone: [phone] Fax: [phone]

## 2019-07-05 NOTE — Patient Instructions (Signed)
Medication Instructions:  Your physician recommends that you continue on your current medications as directed. Please refer to the Current Medication list given to you today.  If you need a refill on your cardiac medications before your next appointment, please call your pharmacy.   Lab work: NONE   If you have labs (blood work) drawn today and your tests are completely normal, you will receive your results only by: Marland Kitchen MyChart Message (if you have MyChart) OR . A paper copy in the mail If you have any lab test that is abnormal or we need to change your treatment, we will call you to review the results.  Testing/Procedures: NONE   Follow-Up: At [facility], you and your health needs are our priority.  As part of our continuing mission to provide you with exceptional heart care, we have created designated Provider Care Teams.  These Care Teams include your primary Cardiologist (physician) and Advanced Practice Providers (APPs -  Physician Assistants and Nurse Practitioners) who all work together to provide you with the care you need, when you need it. You will need a follow up appointment in 1 months.  Please call our office 2 months in advance to schedule this appointment.  You may see Minus Breeding, MD or one of the following Advanced Practice Providers on your designated Care Team:   Bernerd Pho, PA-C [facility]) . Ermalinda Barrios, PA-C ([place])  Any Other Special Instructions Will Be Listed Below (If Applicable). Thank you for choosing [facility]!

## 2019-07-06 ENCOUNTER — Encounter: Payer: Self-pay | Admitting: Student

## 2019-07-19 ENCOUNTER — Other Ambulatory Visit: Payer: Self-pay

## 2019-07-19 ENCOUNTER — Ambulatory Visit (HOSPITAL_COMMUNITY)
Admission: RE | Admit: 2019-07-19 | Discharge: 2019-07-19 | Disposition: A | Discharge status: Home / Self Care | Payer: Managed Care, Other (non HMO) | Source: Ambulatory Visit | Attending: Student | Admitting: Student

## 2019-07-19 DIAGNOSIS — I5042 Chronic combined systolic (congestive) and diastolic (congestive) heart failure: Secondary | ICD-10-CM | POA: Insufficient documentation

## 2019-07-19 NOTE — Progress Notes (Signed)
*  PRELIMINARY RESULTS* Echocardiogram 2D Echocardiogram LIMITED has been performed.  Roberto Guerra 07/19/2019, 2:42 PM

## 2019-08-02 ENCOUNTER — Other Ambulatory Visit: Payer: Self-pay

## 2019-08-02 ENCOUNTER — Ambulatory Visit (INDEPENDENT_AMBULATORY_CARE_PROVIDER_SITE_OTHER): Payer: Managed Care, Other (non HMO) | Admitting: Cardiology

## 2019-08-02 ENCOUNTER — Encounter: Payer: Self-pay | Admitting: Cardiology

## 2019-08-02 VITALS — BP 148/88 | HR 91 | Temp 97.5°F | Ht 67.0 in | Wt 303.0 lb

## 2019-08-02 DIAGNOSIS — R0789 Other chest pain: Secondary | ICD-10-CM

## 2019-08-02 DIAGNOSIS — I5022 Chronic systolic (congestive) heart failure: Secondary | ICD-10-CM

## 2019-08-02 DIAGNOSIS — R002 Palpitations: Secondary | ICD-10-CM | POA: Diagnosis not present

## 2019-08-02 DIAGNOSIS — I10 Essential (primary) hypertension: Secondary | ICD-10-CM | POA: Diagnosis not present

## 2019-08-02 MED ORDER — LISINOPRIL 40 MG PO TABS: 40.0000 mg | ORAL_TABLET | Freq: Every day | ORAL | 3 refills | Status: DC

## 2019-08-02 NOTE — Addendum Note (Signed)
Addended by: Debbora Lacrosse R on: 08/02/2019 04:36 PM   Modules accepted: Orders

## 2019-08-02 NOTE — Progress Notes (Signed)
Clinical Summary Mr. Hams is a 58 y.o.male seen today for follow up of the following medical problems.    1. Chronic systolic HF - new diagnosis during 03/2019 admission in setting of HCAP - 03/2019 echo LVEF 40-45%, grade II diastolic dysfunction - [id number] echo LVEF 55-60%, grade II diastolic dysfunction  - no recent SOb/DOE. Some swelling at times in legs  2. Palpitations - 05/2019 heart monitor 14 day  14 day monitor  Occasional PVCs, rare couplets. Isolated 4 beat run of NSVT  Occasioanl PACs, rare couplets and triplets  No symptoms reported   - no recent palpitations.   3. Chest pain - multiple admits for chest pain, no clear signs of ischemia - 03/2019 nuclear stress no ischemia.  - no recent chest pain.   4. HTN - compliant with meds - does not check at home.   Past Medical History:  Diagnosis Date  . CHF (congestive heart failure) ([place])    a. EF 40-45% in 03/2019 with NST showing no reversible ischemia  . Diabetes mellitus without complication ([place])   . Gout   . Hyperlipidemia   . Hypertension   . PNA (pneumonia)      No Known Allergies   Current Outpatient Medications  Medication Sig Dispense Refill  . allopurinol (ZYLOPRIM) 300 MG tablet Take 300 mg by mouth daily.      . carvedilol (COREG) 12.5 MG tablet Take 1 tablet (12.5 mg total) by mouth 2 (two) times daily with a meal. 180 tablet 3  . furosemide (LASIX) 40 MG tablet Take 1 tablet (40 mg total) by mouth daily. 90 tablet 3  . gabapentin (NEURONTIN) 300 MG capsule Take 300 mg by mouth 3 (three) times daily.     Marland Kitchen HYDROcodone-acetaminophen (NORCO/VICODIN) 5-325 MG tablet Take 1 tablet by mouth every 6 (six) hours as needed for severe pain.    Marland Kitchen lisinopril (ZESTRIL) 20 MG tablet Take 1 tablet (20 mg total) by mouth daily. 90 tablet 3  . meloxicam (MOBIC) 15 MG tablet Take 15 mg by mouth daily.    . metFORMIN (GLUCOPHAGE) 1000 MG tablet Take 1,000 mg by mouth 2 (two) times daily.     .  nitroGLYCERIN (NITROSTAT) 0.4 MG SL tablet Place 1 tablet (0.4 mg total) under the tongue every 5 (five) minutes as needed for chest pain (CP or SOB). 30 tablet 3  . Omega-3 Fatty Acids (FISH OIL) 1000 MG CAPS Take 1,000 mg by mouth daily.    . pantoprazole (PROTONIX) 40 MG tablet Take 1 tablet (40 mg total) by mouth daily. 30 tablet 1  . potassium chloride (K-DUR) 10 MEQ tablet Take 1 tablet (10 mEq total) by mouth daily. 90 tablet 3  . pravastatin (PRAVACHOL) 40 MG tablet Take 40 mg by mouth every morning.      No current facility-administered medications for this visit.      Past Surgical History:  Procedure Laterality Date  . CATARACT EXTRACTION W/PHACO Left 10/29/2018   Procedure: CATARACT EXTRACTION PHACO AND INTRAOCULAR LENS PLACEMENT ([facility]);  Surgeon: Tonny Aianna Fahs, MD;  Location: [facility];  Service: Ophthalmology;  Laterality: Left;  CDE: 43.37  . COLONOSCOPY N/A 02/27/2014   Procedure: COLONOSCOPY;  Surgeon: Rogene Houston, MD;  Location: [facility];  Service: Endoscopy;  Laterality: N/A;  830  . NO PAST SURGERIES       No Known Allergies    Family History  Problem Relation Age of Onset  . Diabetes Other   .  Diabetes Sister   . Colon cancer Neg Hx      Social History Mr. Galioto reports that he quit smoking about 20 years ago. His smoking use included cigarettes. He has a 0.25 pack-year smoking history. He has never used smokeless tobacco. Mr. Giarraputo reports no history of alcohol use.   Review of Systems CONSTITUTIONAL: No weight loss, fever, chills, weakness or fatigue.  HEENT: Eyes: No visual loss, blurred vision, double vision or yellow sclerae.No hearing loss, sneezing, congestion, runny nose or sore throat.  SKIN: No rash or itching.  CARDIOVASCULAR: per hpi RESPIRATORY: No shortness of breath, cough or sputum.  GASTROINTESTINAL: No anorexia, nausea, vomiting or diarrhea. No abdominal pain or blood.  GENITOURINARY: No burning on urination, no polyuria  NEUROLOGICAL: No headache, dizziness, syncope, paralysis, ataxia, numbness or tingling in the extremities. No change in bowel or bladder control.  MUSCULOSKELETAL: No muscle, back pain, joint pain or stiffness.  LYMPHATICS: No enlarged nodes. No history of splenectomy.  PSYCHIATRIC: No history of depression or anxiety.  ENDOCRINOLOGIC: No reports of sweating, cold or heat intolerance. No polyuria or polydipsia.  Marland Kitchen   Physical Examination Today's Vitals   08/02/19 1539  BP: (!) 148/88  Pulse: 91  Temp: (!) 97.5 F (36.4 C)  Weight: (!) 303 lb (137.4 kg)  Height: 5\' 7"  (1.702 m)   Body mass index is 47.46 kg/m.  Gen: resting comfortably, no acute distress HEENT: no scleral icterus, pupils equal round and reactive, no palptable cervical adenopathy,  CV: RRR, no m/r/g, no jvd Resp: Clear to auscultation bilaterally GI: abdomen is soft, non-tender, non-distended, normal bowel sounds, no hepatosplenomegaly MSK: extremities are warm, no edema.  Skin: warm, no rash Neuro:  no focal deficits Psych: appropriate affect   Diagnostic Studies   03/2019 echo IMPRESSIONS    1. The left ventricle has mild-moderately reduced systolic function, with an ejection fraction of 40-45%. The cavity size was normal. There is mild concentric left ventricular hypertrophy. Left ventricular diastolic Doppler parameters are consistent  with pseudonormalization. Elevated mean left atrial pressure.  2. The right ventricle has normal systolic function. The cavity was normal. There is no increase in right ventricular wall thickness. Right ventricular systolic pressure could not be assessed.  3. There is mild mitral annular calcification present.  4. The tricuspid valve is grossly normal.   05/2019 heart monitor 14 day  14 day monitor  Occasional PVCs, rare couplets. Isolated 4 beat run of NSVT  Occasioanl PACs, rare couplets and triplets  No symptoms reported  07/2019 echo IMPRESSIONS    1.  The left ventricle has normal systolic function, with an ejection fraction of 55-60%. Left ventricular diastolic Doppler parameters are consistent with pseudonormal.  2. The right ventricle has normal systolc function. The cavity was normal. There is no increase in right ventricular wall thickness.  3. The mitral valve is grossly normal.  4. The tricuspid valve was grossly normal.  5. The aortic valve is grossly normal.  6. The aortic root is normal in size and structure.  7. The interatrial septum was not well visualized.   03/2019 nuclear stress test  There was no ST segment deviation noted during stress.  No T wave inversion was noted during stress.  Defect 1: There is a medium defect of mild severity present in the basal inferior location.  Defect 2: There is a small defect of mild severity present in the apex location.  This is a high risk study.  The left ventricular ejection  fraction is severely decreased (<30%).  Nuclear stress EF: 25%.    Assessment and Plan  1. Chronic systolic HF - LVEF has normalized, no recent symptoms. COntinue current meds  2. Chest pain - previous atypical symptoms, resolved. Negative stress test 03/2019 - continue to monitor  3. HTN - manual recheck 140/90, above goal of <130/80. Increase lisinopril to 40mg  daily. BMET in 2 weeks   4. Palpitations - resolved, continue beta blocker. Event monitor as reported above   F?u 6 months  Arnoldo Lenis, M.D.

## 2019-08-02 NOTE — Patient Instructions (Addendum)
Medication Instructions:  INCREASE LISINOPRIL TO 40 MG DAILY   Labwork: 2 WEEKS   BMET  Testing/Procedures: NONE  Follow-Up: Your physician wants you to follow-up in: 6 MONTHS.  You will receive a reminder letter in the mail two months in advance. If you don't receive a letter, please call our office to schedule the follow-up appointment.   Any Other Special Instructions Will Be Listed Below (If Applicable).     If you need a refill on your cardiac medications before your next appointment, please call your pharmacy.

## 2020-03-13 ENCOUNTER — Encounter (HOSPITAL_COMMUNITY): Payer: Self-pay | Admitting: Emergency Medicine

## 2020-03-13 ENCOUNTER — Emergency Department (HOSPITAL_COMMUNITY): Payer: Managed Care, Other (non HMO)

## 2020-03-13 ENCOUNTER — Emergency Department (HOSPITAL_COMMUNITY)
Admission: EM | Admit: 2020-03-13 | Discharge: 2020-03-13 | Disposition: A | Discharge status: Home / Self Care | Payer: Managed Care, Other (non HMO) | Attending: Emergency Medicine | Admitting: Emergency Medicine

## 2020-03-13 ENCOUNTER — Other Ambulatory Visit: Payer: Self-pay

## 2020-03-13 DIAGNOSIS — Z79899 Other long term (current) drug therapy: Secondary | ICD-10-CM | POA: Diagnosis not present

## 2020-03-13 DIAGNOSIS — Z87891 Personal history of nicotine dependence: Secondary | ICD-10-CM | POA: Insufficient documentation

## 2020-03-13 DIAGNOSIS — Z7984 Long term (current) use of oral hypoglycemic drugs: Secondary | ICD-10-CM | POA: Diagnosis not present

## 2020-03-13 DIAGNOSIS — R42 Dizziness and giddiness: Secondary | ICD-10-CM | POA: Diagnosis not present

## 2020-03-13 DIAGNOSIS — E119 Type 2 diabetes mellitus without complications: Secondary | ICD-10-CM | POA: Diagnosis not present

## 2020-03-13 DIAGNOSIS — I110 Hypertensive heart disease with heart failure: Secondary | ICD-10-CM | POA: Insufficient documentation

## 2020-03-13 DIAGNOSIS — I509 Heart failure, unspecified: Secondary | ICD-10-CM | POA: Diagnosis not present

## 2020-03-13 LAB — CBC WITH DIFFERENTIAL/PLATELET
Abs Immature Granulocytes: 0.01 10*3/uL (ref 0.00–0.07)
Basophils Absolute: 0 10*3/uL (ref 0.0–0.1)
Basophils Relative: 0 %
Eosinophils Absolute: 0.2 10*3/uL (ref 0.0–0.5)
Eosinophils Relative: 3 %
HCT: 40.1 % (ref 39.0–52.0)
Hemoglobin: 12.5 g/dL — ABNORMAL LOW (ref 13.0–17.0)
Immature Granulocytes: 0 %
Lymphocytes Relative: 31 %
Lymphs Abs: 2.3 10*3/uL (ref 0.7–4.0)
MCH: 29.8 pg (ref 26.0–34.0)
MCHC: 31.2 g/dL (ref 30.0–36.0)
MCV: 95.7 fL (ref 80.0–100.0)
Monocytes Absolute: 0.6 10*3/uL (ref 0.1–1.0)
Monocytes Relative: 8 %
Neutro Abs: 4.3 10*3/uL (ref 1.7–7.7)
Neutrophils Relative %: 58 %
Platelets: 150 10*3/uL (ref 150–400)
RBC: 4.19 MIL/uL — ABNORMAL LOW (ref 4.22–5.81)
RDW: 14.1 % (ref 11.5–15.5)
WBC: 7.5 10*3/uL (ref 4.0–10.5)
nRBC: 0 % (ref 0.0–0.2)

## 2020-03-13 LAB — BASIC METABOLIC PANEL
Anion gap: 11 (ref 5–15)
BUN: 20 mg/dL (ref 6–20)
CO2: 24 mmol/L (ref 22–32)
Calcium: 9.2 mg/dL (ref 8.9–10.3)
Chloride: 105 mmol/L (ref 98–111)
Creatinine, Ser: 1.05 mg/dL (ref 0.61–1.24)
GFR calc Af Amer: >60 mL/min (ref >60)
GFR calc non Af Amer: >60 mL/min (ref >60)
Glucose, Bld: 137 mg/dL — ABNORMAL HIGH (ref 70–99)
Potassium: 3.7 mmol/L (ref 3.5–5.1)
Sodium: 140 mmol/L (ref 135–145)

## 2020-03-13 MED ORDER — MECLIZINE HCL 12.5 MG PO TABS
25.0000 mg | ORAL_TABLET | Freq: Once | ORAL | Status: AC
  Administered 2020-03-13: 25 mg via ORAL
  Filled 2020-03-13: qty 2

## 2020-03-13 MED ORDER — MECLIZINE HCL 25 MG PO TABS: 25.0000 mg | ORAL_TABLET | Freq: Three times a day (TID) | ORAL | 0 refills | Status: DC | PRN

## 2020-03-13 MED ORDER — SODIUM CHLORIDE 0.9 % IV BOLUS
500.0000 mL | Freq: Once | INTRAVENOUS | Status: AC
  Administered 2020-03-13: 07:00:00 500 mL via INTRAVENOUS

## 2020-03-13 MED ORDER — LORAZEPAM 2 MG/ML IJ SOLN
1.0000 mg | Freq: Once | INTRAMUSCULAR | Status: AC
  Administered 2020-03-13: 07:00:00 1 mg via INTRAVENOUS
  Filled 2020-03-13: qty 1

## 2020-03-13 NOTE — ED Triage Notes (Signed)
Patient states dizziness that started over there last 2-3 days. Patient states dizziness is constant.

## 2020-03-13 NOTE — ED Provider Notes (Signed)
[facility] EMERGENCY DEPARTMENT Provider Note   CSN: [id number] Arrival date & time: 03/13/20  0530     History Chief Complaint  Patient presents with  . Dizziness    Roberto Guerra is a 59 y.o. male.  Patient presents to the emergency department for evaluation of dizziness.  Patient reports that symptoms began 2 or 3 days ago.  It feels like a sensation of spinning.  Symptoms significantly worsen if he stands up or moves around, get better if he sits down and sit still.  No headache, vision change.  No hearing loss or ringing in his ears.  He has never had similar symptoms.        Past Medical History:  Diagnosis Date  . CHF (congestive heart failure) ([place])    a. EF 40-45% in 03/2019 with NST showing no reversible ischemia  . Diabetes mellitus without complication ([place])   . Gout   . Hyperlipidemia   . Hypertension   . PNA (pneumonia)     Patient Active Problem List   Diagnosis Date Noted  . Atypical chest pain 06/10/2019  . Dyspnea 03/30/2019  . Tachycardia 03/30/2019  . Neuralgia of lower extremity 08/15/2012  . ERECTILE DYSFUNCTION 08/29/2008  . CERUMEN IMPACTION, BILATERAL 06/27/2008  . GOUT 09/21/2007  . Hyperlipidemia 10/12/2006  . OBESITY NOS 10/12/2006  . Essential hypertension 10/12/2006  . GERD 10/12/2006  . HIATAL HERNIA 10/12/2006    Past Surgical History:  Procedure Laterality Date  . CATARACT EXTRACTION W/PHACO Left 10/29/2018   Procedure: CATARACT EXTRACTION PHACO AND INTRAOCULAR LENS PLACEMENT (IOC);  Surgeon: Tonny Branch, MD;  Location: [facility] ORS;  Service: Ophthalmology;  Laterality: Left;  CDE: 43.37  . COLONOSCOPY N/A 02/27/2014   Procedure: COLONOSCOPY;  Surgeon: Rogene Houston, MD;  Location: [facility] ENDO SUITE;  Service: Endoscopy;  Laterality: N/A;  830  . NO PAST SURGERIES         Family History  Problem Relation Age of Onset  . Diabetes Other   . Diabetes Sister   . Colon cancer Neg Hx     Social History   Tobacco Use  .  Smoking status: Former Smoker    Packs/day: 0.25    Years: 1.00    Pack years: 0.25    Types: Cigarettes    Quit date: 11/30/1998    Years since quitting: 21.2  . Smokeless tobacco: Never Used  Substance Use Topics  . Alcohol use: No  . Drug use: No    Home Medications Prior to Admission medications   Medication Sig Start Date End Date Taking? Authorizing Provider  allopurinol (ZYLOPRIM) 300 MG tablet Take 300 mg by mouth daily.      [provider]  carvedilol (COREG) 12.5 MG tablet Take 1 tablet (12.5 mg total) by mouth 2 (two) times daily with a meal. 05/01/19 04/25/20  Strader, Fransisco Hertz, PA-C  furosemide (LASIX) 40 MG tablet Take 1 tablet (40 mg total) by mouth daily. 05/01/19 04/25/20  Strader, Fransisco Hertz, PA-C  gabapentin (NEURONTIN) 300 MG capsule Take 300 mg by mouth 3 (three) times daily.     [provider]  HYDROcodone-acetaminophen (NORCO/VICODIN) 5-325 MG tablet Take 1 tablet by mouth every 6 (six) hours as needed for severe pain.    [provider]  lisinopril (ZESTRIL) 40 MG tablet Take 1 tablet (40 mg total) by mouth daily. 08/02/19 10/31/19  Arnoldo Lenis, MD  meloxicam (MOBIC) 15 MG tablet Take 15 mg by mouth daily.  [provider]  metFORMIN (GLUCOPHAGE) 1000 MG tablet Take 1,000 mg by mouth 2 (two) times daily.     [provider]  nitroGLYCERIN (NITROSTAT) 0.4 MG SL tablet Place 1 tablet (0.4 mg total) under the tongue every 5 (five) minutes as needed for chest pain (CP or SOB). 06/11/19 08/02/19  Manuella Ghazi, Pratik D, DO  Omega-3 Fatty Acids (FISH OIL) 1000 MG CAPS Take 1,000 mg by mouth daily.    [provider]  pantoprazole (PROTONIX) 40 MG tablet Take 1 tablet (40 mg total) by mouth daily. 06/11/19 06/10/20  Manuella Ghazi, Pratik D, DO  potassium chloride (K-DUR) 10 MEQ tablet Take 1 tablet (10 mEq total) by mouth daily. 05/01/19 04/25/20  Strader, Fransisco Hertz, PA-C  pravastatin (PRAVACHOL) 40 MG tablet Take 40 mg by mouth every  morning.     [provider]    Allergies    Patient has no known allergies.  Review of Systems   Review of Systems  Neurological: Positive for dizziness.  All other systems reviewed and are negative.   Physical Exam Updated Vital Signs BP 119/82 (BP Location: Left Arm)   Pulse 82   Temp 98.1 F (36.7 C) (Oral)   Resp 16   Ht 5\' 7"  (1.702 m)   Wt 134.7 kg   SpO2 96%   BMI 46.52 kg/m   Physical Exam Vitals and nursing note reviewed.  Constitutional:      General: He is not in acute distress.    Appearance: Normal appearance. He is well-developed.  HENT:     Head: Normocephalic and atraumatic.     Right Ear: Hearing normal.     Left Ear: Hearing normal.     Nose: Nose normal.  Eyes:     Conjunctiva/sclera: Conjunctivae normal.     Pupils: Pupils are equal, round, and reactive to light.  Cardiovascular:     Rate and Rhythm: Regular rhythm.     Heart sounds: S1 normal and S2 normal. No murmur. No friction rub. No gallop.   Pulmonary:     Effort: Pulmonary effort is normal. No respiratory distress.     Breath sounds: Normal breath sounds.  Chest:     Chest wall: No tenderness.  Abdominal:     General: Bowel sounds are normal.     Palpations: Abdomen is soft.     Tenderness: There is no abdominal tenderness. There is no guarding or rebound. Negative signs include Murphy's sign and McBurney's sign.     Hernia: No hernia is present.  Musculoskeletal:        General: Normal range of motion.     Cervical back: Normal range of motion and neck supple.  Skin:    General: Skin is warm and dry.     Findings: No rash.  Neurological:     Mental Status: He is alert and oriented to person, place, and time.     GCS: GCS eye subscore is 4. GCS verbal subscore is 5. GCS motor subscore is 6.     Cranial Nerves: No cranial nerve deficit.     Sensory: No sensory deficit.     Coordination: Coordination normal.     Comments: Extraocular muscle movement: normal No visual  field cut Pupils: equal and reactive both direct and consensual response is normal Fatigable lateral nystagmus present    Sensory function is intact to light touch, pinprick Proprioception intact  Grip strength 5/5 symmetric in upper extremities No pronator drift Normal finger to nose bilaterally  Lower extremity strength 5/5 against gravity Normal heel to shin bilaterally  Gait: normal   Psychiatric:        Speech: Speech normal.        Behavior: Behavior normal.        Thought Content: Thought content normal.     ED Results / Procedures / Treatments   Labs (all labs ordered are listed, but only abnormal results are displayed) Labs Reviewed  CBC WITH DIFFERENTIAL/PLATELET - Abnormal; Notable for the following components:      Result Value   RBC 4.19 (*)    Hemoglobin 12.5 (*)    All other components within normal limits  BASIC METABOLIC PANEL    EKG EKG Interpretation  Date/Time:  Friday Mar 13 2020 05:58:47 EDT Ventricular Rate:  80 PR Interval:    QRS Duration: 99 QT Interval:  395 QTC Calculation: 456 R Axis:   34 Text Interpretation: Sinus rhythm Normal ECG Confirmed by Orpah Greek [id number]) on 03/13/2020 6:02:32 AM   Radiology No results found.  Procedures Procedures (including critical care time)  Medications Ordered in ED Medications  sodium chloride 0.9 % bolus 500 mL (500 mLs Intravenous New Bag/Given 03/13/20 0642)  LORazepam (ATIVAN) injection 1 mg (1 mg Intravenous Given 03/13/20 0639)  meclizine (ANTIVERT) tablet 25 mg (25 mg Oral Given 03/13/20 [id number])    ED Course  I have reviewed the triage vital signs and the nursing notes.  Pertinent labs & imaging results that were available during my care of the patient were reviewed by me and considered in my medical decision making (see chart for details).    MDM Rules/Calculators/A&P                      Patient presents to the emergency department for evaluation of positional dizziness that  has been ongoing for 2 or 3 days.  He has a normal neurologic examination.  No focal findings.  Patient undergo CT scan, basic testing.  Will treat symptomatically with fluids, Ativan, meclizine.  If symptoms resolve no further work-up necessary.  Will sign out to oncoming ER physician to follow his progress.  Final Clinical Impression(s) / ED Diagnoses Final diagnoses:  Vertigo    Rx / DC Orders ED Discharge Orders    None       Torie Towle, Gwenyth Allegra, MD 03/13/20 [phone]

## 2020-03-13 NOTE — Discharge Instructions (Addendum)
Rest at home for the next day.  Follow-up with your doctor next week if any problems.

## 2020-03-24 ENCOUNTER — Telehealth: Payer: Self-pay | Admitting: Cardiology

## 2020-03-24 NOTE — Telephone Encounter (Signed)
°  Patient Consent for Virtual Visit         Roberto Guerra has provided verbal consent on 03/24/2020 for a virtual visit (video or telephone).   CONSENT FOR VIRTUAL VISIT FOR:  Roberto Guerra  By participating in this virtual visit I agree to the following:  I hereby voluntarily request, consent and authorize [facility] and its employed or contracted physicians, physician assistants, nurse practitioners or other licensed health care professionals (the Practitioner), to provide me with telemedicine health care services (the Services") as deemed necessary by the treating Practitioner. I acknowledge and consent to receive the Services by the Practitioner via telemedicine. I understand that the telemedicine visit will involve communicating with the Practitioner through live audiovisual communication technology and the disclosure of certain medical information by electronic transmission. I acknowledge that I have been given the opportunity to request an in-person assessment or other available alternative prior to the telemedicine visit and am voluntarily participating in the telemedicine visit.  I understand that I have the right to withhold or withdraw my consent to the use of telemedicine in the course of my care at any time, without affecting my right to future care or treatment, and that the Practitioner or I may terminate the telemedicine visit at any time. I understand that I have the right to inspect all information obtained and/or recorded in the course of the telemedicine visit and may receive copies of available information for a reasonable fee.  I understand that some of the potential risks of receiving the Services via telemedicine include:   Delay or interruption in medical evaluation due to technological equipment failure or disruption;  Information transmitted may not be sufficient (e.g. poor resolution of images) to allow for appropriate medical decision making by the Practitioner;  and/or   In rare instances, security protocols could fail, causing a breach of personal health information.  Furthermore, I acknowledge that it is my responsibility to provide information about my medical history, conditions and care that is complete and accurate to the best of my ability. I acknowledge that Practitioner's advice, recommendations, and/or decision may be based on factors not within their control, such as incomplete or inaccurate data provided by me or distortions of diagnostic images or specimens that may result from electronic transmissions. I understand that the practice of medicine is not an exact science and that Practitioner makes no warranties or guarantees regarding treatment outcomes. I acknowledge that a copy of this consent can be made available to me via my patient portal ([facility]), or I can request a printed copy by calling the office of [facility].    I understand that my insurance will be billed for this visit.   I have read or had this consent read to me.  I understand the contents of this consent, which adequately explains the benefits and risks of the Services being provided via telemedicine.   I have been provided ample opportunity to ask questions regarding this consent and the Services and have had my questions answered to my satisfaction.  I give my informed consent for the services to be provided through the use of telemedicine in my medical care

## 2020-03-26 ENCOUNTER — Encounter: Payer: Self-pay | Admitting: *Deleted

## 2020-03-26 ENCOUNTER — Encounter: Payer: Self-pay | Admitting: Cardiology

## 2020-03-26 ENCOUNTER — Telehealth (INDEPENDENT_AMBULATORY_CARE_PROVIDER_SITE_OTHER): Payer: Managed Care, Other (non HMO) | Admitting: Cardiology

## 2020-03-26 VITALS — Ht 66.0 in | Wt 302.0 lb

## 2020-03-26 DIAGNOSIS — I10 Essential (primary) hypertension: Secondary | ICD-10-CM

## 2020-03-26 DIAGNOSIS — R42 Dizziness and giddiness: Secondary | ICD-10-CM

## 2020-03-26 DIAGNOSIS — R002 Palpitations: Secondary | ICD-10-CM

## 2020-03-26 DIAGNOSIS — I5022 Chronic systolic (congestive) heart failure: Secondary | ICD-10-CM | POA: Diagnosis not present

## 2020-03-26 MED ORDER — FUROSEMIDE 20 MG PO TABS: 20.0000 mg | ORAL_TABLET | Freq: Every day | ORAL | 3 refills | Status: DC

## 2020-03-26 NOTE — Progress Notes (Signed)
Virtual Visit via Telephone Note   This visit type was conducted due to national recommendations for restrictions regarding the COVID-19 Pandemic (e.g. social distancing) in an effort to limit this patient's exposure and mitigate transmission in our community.  Due to his co-morbid illnesses, this patient is at least at moderate risk for complications without adequate follow up.  This format is felt to be most appropriate for this patient at this time.  The patient did not have access to video technology/had technical difficulties with video requiring transitioning to audio format only (telephone).  All issues noted in this document were discussed and addressed.  No physical exam could be performed with this format.  Please refer to the patient's chart for his  consent to telehealth for [facility].   The patient was identified using 2 identifiers.  Date:  03/26/2020   ID:  Roberto Guerra, DOB Mar 12, 1961, MRN [id number]  Patient Location: Home Provider Location: Office  PCP:  Celene Squibb, MD  Cardiologist:  Minus Breeding, MD  Electrophysiologist:  None   Evaluation Performed:  Follow-Up Visit  Chief Complaint:  Follow up  History of Present Illness:    Roberto Guerra is a 59 y.o. male  seen today for follow up of the following medical problems.    1. Chronic systolic HF - new diagnosis during 03/2019 admission in setting of HCAP - 03/2019 echo LVEF 40-45%, grade II diastolic dysfunction - [id number] echo LVEF 55-60%, grade II diastolic dysfunction  - some recent orthostatic symptoms - no SOB/DOE  2. Palpitations - 05/2019 heart monitor 14 day  14 day monitor  Occasional PVCs, rare couplets. Isolated 4 beat run of NSVT  Occasioanl PACs, rare couplets and triplets  No symptoms reported   - some ongoing palpitations at times, mild   3. Chest pain - multiple admits for chest pain, no clear signs of ischemia - 03/2019 nuclear stress no ischemia.  No recent  symptoms  4. HTN - he is compliant with meds   5. Dizziness - seen in ER 03/13/20 with dizziness - normal EKG and labs. CT head no acute findings - feeling of room spinning, though occurs with standing.  - better with meclizine, thought only occurs with standing.     SH: has not had COVID vaccine.    The patient does not have symptoms concerning for COVID-19 infection (fever, chills, cough, or new shortness of breath).    Past Medical History:  Diagnosis Date  . CHF (congestive heart failure) ([place])    a. EF 40-45% in 03/2019 with NST showing no reversible ischemia  . Diabetes mellitus without complication ([place])   . Gout   . Hyperlipidemia   . Hypertension   . PNA (pneumonia)    Past Surgical History:  Procedure Laterality Date  . CATARACT EXTRACTION W/PHACO Left 10/29/2018   Procedure: CATARACT EXTRACTION PHACO AND INTRAOCULAR LENS PLACEMENT (IOC);  Surgeon: Tonny Adarsh Mundorf, MD;  Location: [facility] ORS;  Service: Ophthalmology;  Laterality: Left;  CDE: 43.37  . COLONOSCOPY N/A 02/27/2014   Procedure: COLONOSCOPY;  Surgeon: Rogene Houston, MD;  Location: [facility] ENDO SU[facility];  Service: Endoscopy;  Laterality: N/A;  830  . NO PAST SURGERIES       Current Meds  Medication Sig  . allopurinol (ZYLOPRIM) 300 MG tablet Take 300 mg by mouth daily.    . carvedilol (COREG) 12.5 MG tablet Take 1 tablet (12.5 mg total) by mouth 2 (two) times daily with a meal.  . furosemide (  LASIX) 40 MG tablet Take 1 tablet (40 mg total) by mouth daily.  Marland Kitchen gabapentin (NEURONTIN) 300 MG capsule Take 300 mg by mouth 3 (three) times daily.   Marland Kitchen HYDROcodone-acetaminophen (NORCO/VICODIN) 5-325 MG tablet Take 1 tablet by mouth every 6 (six) hours as needed for severe pain.  Marland Kitchen lisinopril (ZESTRIL) 40 MG tablet Take 1 tablet (40 mg total) by mouth daily.  . meclizine (ANTIVERT) 25 MG tablet Take 1 tablet (25 mg total) by mouth 3 (three) times daily as needed for dizziness.  . meloxicam (MOBIC) 15 MG tablet Take 15 mg by  mouth daily.  . metFORMIN (GLUCOPHAGE) 1000 MG tablet Take 1,000 mg by mouth 2 (two) times daily.   . nitroGLYCERIN (NITROSTAT) 0.4 MG SL tablet Place 1 tablet (0.4 mg total) under the tongue every 5 (five) minutes as needed for chest pain (CP or SOB).  . Omega-3 Fatty Acids (FISH OIL) 1000 MG CAPS Take 1,000 mg by mouth daily.  . potassium chloride (K-DUR) 10 MEQ tablet Take 1 tablet (10 mEq total) by mouth daily.  . pravastatin (PRAVACHOL) 40 MG tablet Take 40 mg by mouth every morning.      Allergies:   Patient has no known allergies.   Social History   Tobacco Use  . Smoking status: Former Smoker    Packs/day: 0.25    Years: 1.00    Pack years: 0.25    Types: Cigarettes    Quit date: 11/30/1998    Years since quitting: 21.3  . Smokeless tobacco: Never Used  Substance Use Topics  . Alcohol use: No  . Drug use: No     Family Hx: The patient's family history includes Diabetes in his sister and another family member. There is no history of Colon cancer.  ROS:   Please see the history of present illness.     All other systems reviewed and are negative.   Prior CV studies:   The following studies were reviewed today:  03/2019 echo IMPRESSIONS   1. The left ventricle has mild-moderately reduced systolic function, with an ejection fraction of 40-45%. The cavity size was normal. There is mild concentric left ventricular hypertrophy. Left ventricular diastolic Doppler parameters are consistent  with pseudonormalization. Elevated mean left atrial pressure. 2. The right ventricle has normal systolic function. The cavity was normal. There is no increase in right ventricular wall thickness. Right ventricular systolic pressure could not be assessed. 3. There is mild mitral annular calcification present. 4. The tricuspid valve is grossly normal.   05/2019 heart monitor 14 day  14 day monitor  Occasional PVCs, rare couplets. Isolated 4 beat run of NSVT  Occasioanl PACs,  rare couplets and triplets  No symptoms reported  07/2019 echo IMPRESSIONS   1. The left ventricle has normal systolic function, with an ejection fraction of 55-60%. Left ventricular diastolic Doppler parameters are consistent with pseudonormal. 2. The right ventricle has normal systolc function. The cavity was normal. There is no increase in right ventricular wall thickness. 3. The mitral valve is grossly normal. 4. The tricuspid valve was grossly normal. 5. The aortic valve is grossly normal. 6. The aortic root is normal in size and structure. 7. The interatrial septum was not well visualized.   03/2019 nuclear stress test  There was no ST segment deviation noted during stress.  No T wave inversion was noted during stress.  Defect 1: There is a medium defect of mild severity present in the basal inferior location.  Defect 2: There  is a small defect of mild severity present in the apex location.  This is a high risk study.  The left ventricular ejection fraction is severely decreased (<30%).  Nuclear stress EF: 25%.  Labs/Other Tests and Data Reviewed:    EKG:  No ECG reviewed.  Recent Labs: 03/31/2019: ALT 31 05/01/2019: B Natriuretic Peptide 22.0 06/10/2019: Magnesium 2.0; TSH 3.729 03/13/2020: BUN 20; Creatinine, Ser 1.05; Hemoglobin 12.5; Platelets 150; Potassium 3.7; Sodium 140   Recent Lipid Panel Lab Results  Component Value Date/Time   CHOL 142 06/11/2019 12:04 AM   TRIG 126 06/11/2019 12:04 AM   HDL 37 (L) 06/11/2019 12:04 AM   CHOLHDL 3.8 06/11/2019 12:04 AM   LDLCALC 80 06/11/2019 12:04 AM    Wt Readings from Last 3 Encounters:  03/26/20 (!) 302 lb (137 kg)  03/13/20 297 lb (134.7 kg)  08/02/19 (!) 303 lb (137.4 kg)     Objective:    Vital Signs:  Ht 5\' 6"  (1.676 m)   Wt (!) 302 lb (137 kg)   BMI 48.74 kg/m    Normal affect. Normal speech pattern and tone. COmfortable, no apparent distress. No audible signs of sob or wheezing.    ASSESSMENT & PLAN:    1. Chronic systolic HF - LVEF has normalized, no recent symptoms.  - some recent orthostatic symptoms, lower lasix to 20mg  daily.    2. HTN - continue current meds   3. Palpitations - previous monitor with benign ectopy, symptoms at times but mild - continue current meds  4. Dizziness - mixed description. Feeling of room spinning better with meclizine, but only occurs with standing up - continue prn meclizine, lower lasix to 20mg  daily and encouraged increased hydration given potential orthostatic component.   COVID-19 Education: The signs and symptoms of COVID-19 were discussed with the patient and how to seek care for testing (follow up with PCP or arrange E-visit).  The importance of social distancing was discussed today.  Time:   Today, I have spent 18 minutes with the patient with telehealth technology discussing the above problems.     Medication Adjustments/Labs and Tests Ordered: Current medicines are reviewed at length with the patient today.  Concerns regarding medicines are outlined above.   Tests Ordered: No orders of the defined types were placed in this encounter.   Medication Changes: No orders of the defined types were placed in this encounter.   Follow Up:  Either In Person or Virtual in 6 month(s)  Signed, Carlyle Dolly, MD  03/26/2020 12:48 PM    [place]

## 2020-03-26 NOTE — Patient Instructions (Signed)
Medication Instructions:  Your physician has recommended you make the following change in your medication:  Decrease Lasix to 20 mg Daily   *If you need a refill on your cardiac medications before your next appointment, please call your pharmacy*   Lab Work: NONE   If you have labs (blood work) drawn today and your tests are completely normal, you will receive your results only by: Marland Kitchen MyChart Message (if you have MyChart) OR . A paper copy in the mail If you have any lab test that is abnormal or we need to change your treatment, we will call you to review the results.   Testing/Procedures: NONE   Follow-Up: At [facility], you and your health needs are our priority.  As part of our continuing mission to provide you with exceptional heart care, we have created designated Provider Care Teams.  These Care Teams include your primary Cardiologist (physician) and Advanced Practice Providers (APPs -  Physician Assistants and Nurse Practitioners) who all work together to provide you with the care you need, when you need it.  We recommend signing up for the patient portal called "MyChart".  Sign up information is provided on this After Visit Summary.  MyChart is used to connect with patients for Virtual Visits (Telemedicine).  Patients are able to view lab/test results, encounter notes, upcoming appointments, etc.  Non-urgent messages can be sent to your provider as well.   To learn more about what you can do with MyChart, go to [email or web address].    Your next appointment:   6 month(s)  The format for your next appointment:   Either In Person or Virtual  Provider:   Carlyle Dolly, MD   Other Instructions Thank you for choosing [facility]!

## 2020-03-26 NOTE — Addendum Note (Signed)
Addended by: Levonne Hubert on: 03/26/2020 02:50 PM   Modules accepted: Orders

## 2020-05-05 ENCOUNTER — Other Ambulatory Visit: Payer: Self-pay | Admitting: Student

## 2020-05-11 ENCOUNTER — Encounter (HOSPITAL_COMMUNITY): Payer: Self-pay | Admitting: *Deleted

## 2020-05-11 ENCOUNTER — Emergency Department (HOSPITAL_COMMUNITY): Payer: Managed Care, Other (non HMO)

## 2020-05-11 ENCOUNTER — Emergency Department (HOSPITAL_COMMUNITY)
Admission: EM | Admit: 2020-05-11 | Discharge: 2020-05-11 | Disposition: A | Discharge status: Home / Self Care | Payer: Managed Care, Other (non HMO) | Attending: Emergency Medicine | Admitting: Emergency Medicine

## 2020-05-11 ENCOUNTER — Other Ambulatory Visit: Payer: Self-pay

## 2020-05-11 DIAGNOSIS — Z7984 Long term (current) use of oral hypoglycemic drugs: Secondary | ICD-10-CM | POA: Insufficient documentation

## 2020-05-11 DIAGNOSIS — M1711 Unilateral primary osteoarthritis, right knee: Secondary | ICD-10-CM

## 2020-05-11 DIAGNOSIS — Z87891 Personal history of nicotine dependence: Secondary | ICD-10-CM | POA: Diagnosis not present

## 2020-05-11 DIAGNOSIS — M25551 Pain in right hip: Secondary | ICD-10-CM | POA: Diagnosis present

## 2020-05-11 DIAGNOSIS — Z79899 Other long term (current) drug therapy: Secondary | ICD-10-CM | POA: Diagnosis not present

## 2020-05-11 DIAGNOSIS — M1611 Unilateral primary osteoarthritis, right hip: Secondary | ICD-10-CM

## 2020-05-11 DIAGNOSIS — I509 Heart failure, unspecified: Secondary | ICD-10-CM | POA: Insufficient documentation

## 2020-05-11 DIAGNOSIS — M25561 Pain in right knee: Secondary | ICD-10-CM | POA: Diagnosis not present

## 2020-05-11 DIAGNOSIS — E119 Type 2 diabetes mellitus without complications: Secondary | ICD-10-CM | POA: Insufficient documentation

## 2020-05-11 MED ORDER — ACETAMINOPHEN 325 MG PO TABS
650.0000 mg | ORAL_TABLET | Freq: Once | ORAL | Status: AC
  Administered 2020-05-11: 650 mg via ORAL
  Filled 2020-05-11: qty 2

## 2020-05-11 NOTE — ED Provider Notes (Signed)
[facility] EMERGENCY DEPARTMENT Provider Note   CSN: [id number] Arrival date & time: 05/11/20  0354     History Chief Complaint  Patient presents with  . Leg Pain    [name] is a 59 y.o. male.  The history is provided by the patient.  Leg Pain Location:  Hip and knee Hip location:  R hip Knee location:  R knee Pain details:    Quality:  Aching   Severity:  Moderate   Onset quality:  Gradual   Duration:  1 month   Timing:  Intermittent   Progression:  Worsening Chronicity:  New Dislocation: no   Prior injury to area:  No Relieved by: rest. Worsened by:  Bearing weight, extension and flexion Associated symptoms: no back pain, no decreased ROM, no fever and no muscle weakness   Patient history of diabetes, obesity, hypertension presents with right hip and knee pain.  Patient reports for the past month he has had pain in both his right hip and right knee.  At times it radiates from the right hip to the knee He reports over the past week is worsening.  He is able to ambulate.  No falls or trauma.  No back pain.  No incontinence.  No weakness in the leg.  No previous history of joint disease.  Patient is able to ambulate     Past Medical History:  Diagnosis Date  . CHF (congestive heart failure) ([place])    a. EF 40-45% in 03/2019 with NST showing no reversible ischemia  . Diabetes mellitus without complication ([place])   . Gout   . Hyperlipidemia   . Hypertension   . PNA (pneumonia)     Patient Active Problem List   Diagnosis Date Noted  . Atypical chest pain 06/10/2019  . Dyspnea 03/30/2019  . Tachycardia 03/30/2019  . Neuralgia of lower extremity 08/15/2012  . ERECTILE DYSFUNCTION 08/29/2008  . CERUMEN IMPACTION, BILATERAL 06/27/2008  . GOUT 09/21/2007  . Hyperlipidemia 10/12/2006  . OBESITY NOS 10/12/2006  . Essential hypertension 10/12/2006  . GERD 10/12/2006  . HIATAL HERNIA 10/12/2006    Past Surgical History:  Procedure Laterality Date  .  CATARACT EXTRACTION W/PHACO Left 10/29/2018   Procedure: CATARACT EXTRACTION PHACO AND INTRAOCULAR LENS PLACEMENT (IOC);  Surgeon: Tonny Branch, MD;  Location: [facility] ORS;  Service: Ophthalmology;  Laterality: Left;  CDE: 43.37  . COLONOSCOPY N/A 02/27/2014   Procedure: COLONOSCOPY;  Surgeon: Rogene Houston, MD;  Location: [facility] ENDO SUITE;  Service: Endoscopy;  Laterality: N/A;  830  . NO PAST SURGERIES         Family History  Problem Relation Age of Onset  . Diabetes Other   . Diabetes Sister   . Colon cancer Neg Hx     Social History   Tobacco Use  . Smoking status: Former Smoker    Packs/day: 0.25    Years: 1.00    Pack years: 0.25    Types: Cigarettes    Quit date: 11/30/1998    Years since quitting: 21.4  . Smokeless tobacco: Never Used  Vaping Use  . Vaping Use: Never used  Substance Use Topics  . Alcohol use: No  . Drug use: No    Home Medications Prior to Admission medications   Medication Sig Start Date End Date Taking? Authorizing Provider  allopurinol (ZYLOPRIM) 300 MG tablet Take 300 mg by mouth daily.      [provider]  carvedilol (COREG) 12.5 MG tablet Take 1 tablet (  12.5 mg total) by mouth 2 (two) times daily with a meal. 05/01/19 04/25/20  Strader, Fransisco Hertz, PA-C  furosemide (LASIX) 20 MG tablet Take 1 tablet (20 mg total) by mouth daily. 03/26/20 06/24/20  Arnoldo Lenis, MD  gabapentin (NEURONTIN) 300 MG capsule Take 300 mg by mouth 3 (three) times daily.     [provider]  HYDROcodone-acetaminophen (NORCO/VICODIN) 5-325 MG tablet Take 1 tablet by mouth every 6 (six) hours as needed for severe pain.    [provider]  lisinopril (ZESTRIL) 40 MG tablet Take 1 tablet (40 mg total) by mouth daily. 08/02/19 03/26/20  Arnoldo Lenis, MD  meclizine (ANTIVERT) 25 MG tablet Take 1 tablet (25 mg total) by mouth 3 (three) times daily as needed for dizziness. 03/13/20   Orpah Greek, MD  meloxicam (MOBIC) 15 MG tablet Take 15 mg  by mouth daily.    [provider]  metFORMIN (GLUCOPHAGE) 1000 MG tablet Take 1,000 mg by mouth 2 (two) times daily.     [provider]  nitroGLYCERIN (NITROSTAT) 0.4 MG SL tablet Place 1 tablet (0.4 mg total) under the tongue every 5 (five) minutes as needed for chest pain (CP or SOB). 06/11/19 03/26/20  Manuella Ghazi, Pratik D, DO  Omega-3 Fatty Acids (FISH OIL) 1000 MG CAPS Take 1,000 mg by mouth daily.    [provider]  potassium chloride (KLOR-CON) 10 MEQ tablet Take 1 tablet by mouth once daily 05/06/20   Arnoldo Lenis, MD  pravastatin (PRAVACHOL) 40 MG tablet Take 40 mg by mouth every morning.     [provider]    Allergies    Patient has no known allergies.  Review of Systems   Review of Systems  Constitutional: Negative for fever.  Cardiovascular: Negative for chest pain.  Gastrointestinal: Negative for abdominal pain.  Musculoskeletal: Positive for arthralgias. Negative for back pain.  Neurological: Negative for weakness.  All other systems reviewed and are negative.   Physical Exam Updated Vital Signs BP (!) 148/90   Pulse 80   Temp 98.1 F (36.7 C) (Oral)   Resp 20   Ht 1.626 m (5\' 4" )   Wt (!) 137 kg   SpO2 98%   BMI 51.84 kg/m   Physical Exam CONSTITUTIONAL: Well developed/well nourished HEAD: Normocephalic/atraumatic EYES: EOMI/PERRL ENMT: Mucous membranes moist NECK: supple no meningeal signs SPINE/BACK:entire spine nontender, no paraspinal tenderness CV: S1/S2 noted, no murmurs/rubs/gallops noted LUNGS: Lungs are clear to auscultation bilaterally, no apparent distress ABDOMEN: soft, nontender, no rebound or guarding, bowel sounds noted throughout abdomen GU:no cva tenderness NEURO: Pt is awake/alert/appropriate, moves all extremitiesx4.  No facial droop.  equal distal motor 5/5 strength noted with the following: hip flexion/knee flexion/extension, ankle dorsi/plantar flexion, great toe extension intact bilaterally,  no  sensory deficit in any dermatome.   EXTREMITIES: pulses normal/equal, full ROM, tenderness to lateral aspect of right thigh.  Tenderness to palpation of right patella.  No deformities.  Full range of motion of both right hip and right knee but limited due to pain.  Distal pulses equal/intact.  No asymmetric edema There is no right inguinal tenderness.  There is no erythema or induration to right thigh SKIN: warm, color normal PSYCH: no abnormalities of mood noted, alert and oriented to situation  ED Results / Procedures / Treatments   Labs (all labs ordered are listed, but only abnormal results are displayed) Labs Reviewed - No data to display  EKG None  Radiology DG Knee Complete  4 Views Right  Result Date: 05/11/2020 CLINICAL DATA:  59 year old male with history of right knee pain. EXAM: RIGHT KNEE - COMPLETE 4+ VIEW COMPARISON:  No priors. FINDINGS: Four views of the right knee demonstrate no acute displaced fracture or dislocation. Patella [place]. Joint space narrowing, subchondral sclerosis, subchondral cyst formation and osteophyte formation is noted in a tricompartmental distribution, most severe in the medial and patellofemoral compartments. IMPRESSION: 1. No acute radiographic abnormality of the right knee. 2. Tricompartmental osteoarthritis of the right knee, as above. Electronically Signed   By: Vinnie Langton M.D.   On: 05/11/2020 05:20   DG Hip Unilat W or Wo Pelvis 2-3 Views Right  Result Date: 05/11/2020 CLINICAL DATA:  59 year old male with history of right-sided hip pain. EXAM: DG HIP (WITH OR WITHOUT PELVIS) 2-3V RIGHT COMPARISON:  No priors. FINDINGS: AP view of the bony pelvis and AP and lateral views of the right hip demonstrate no acute displaced fracture, subluxation or dislocation. The right hip is located. Joint space narrowing, subchondral sclerosis and osteophyte formation is noted in the hip joints bilaterally, compatible with moderate osteoarthritis. IMPRESSION: 1. No  acute radiographic abnormality of the bony pelvis or the right hip. 2. Moderate bilateral hip joint osteoarthritis. Electronically Signed   By: Vinnie Langton M.D.   On: 05/11/2020 05:19    Procedures Procedures    Medications Ordered in ED Medications  acetaminophen (TYLENOL) tablet 650 mg (650 mg Oral Given 05/11/20 0258)    ED Course  I have reviewed the triage vital signs and the nursing notes.  Pertinent  imaging results that were available during my care of the patient were reviewed by me and considered in my medical decision making (see chart for details).    MDM Rules/Calculators/A&P                          Patient presents with right hip and knee pain for the past month.  No trauma.  No signs of DVT.  No signs of any septic joint.  Patient found to have arthritis.  Denies back pain, low suspicion for radiculopathy. Will discharge home with follow with orthopedics.  He can continue his home pain medicine Final Clinical Impression(s) / ED Diagnoses Final diagnoses:  Right hip pain  Acute pain of right knee    Rx / DC Orders ED Discharge Orders    None       Ripley Fraise, MD 05/11/20 [phone]

## 2020-05-11 NOTE — ED Triage Notes (Signed)
Pt c/o right hip pain that radiates down right leg, pain started a month ago but worse over the past week, denies any injury,

## 2020-05-11 NOTE — ED Notes (Signed)
ED Provider at bedside. 

## 2020-05-20 ENCOUNTER — Other Ambulatory Visit: Payer: Self-pay | Admitting: Student

## 2020-06-02 ENCOUNTER — Ambulatory Visit (INDEPENDENT_AMBULATORY_CARE_PROVIDER_SITE_OTHER): Payer: Managed Care, Other (non HMO) | Admitting: Orthopedic Surgery

## 2020-06-02 ENCOUNTER — Other Ambulatory Visit: Payer: Self-pay

## 2020-06-02 ENCOUNTER — Encounter: Payer: Self-pay | Admitting: Orthopedic Surgery

## 2020-06-02 ENCOUNTER — Ambulatory Visit: Payer: Managed Care, Other (non HMO)

## 2020-06-02 VITALS — BP 97/68 | HR 90 | Ht 68.5 in | Wt 307.0 lb

## 2020-06-02 DIAGNOSIS — G8929 Other chronic pain: Secondary | ICD-10-CM

## 2020-06-02 DIAGNOSIS — Z6841 Body Mass Index (BMI) 40.0 and over, adult: Secondary | ICD-10-CM | POA: Diagnosis not present

## 2020-06-02 DIAGNOSIS — M1711 Unilateral primary osteoarthritis, right knee: Secondary | ICD-10-CM

## 2020-06-02 DIAGNOSIS — M5441 Lumbago with sciatica, right side: Secondary | ICD-10-CM

## 2020-06-02 DIAGNOSIS — M4317 Spondylolisthesis, lumbosacral region: Secondary | ICD-10-CM

## 2020-06-02 DIAGNOSIS — M4306 Spondylolysis, lumbar region: Secondary | ICD-10-CM

## 2020-06-02 NOTE — Progress Notes (Signed)
NEW PROBLEM//OFFICE VISIT  Chief Complaint  Patient presents with  . Knee Pain    ER follow upon right knee pain, no injury.     59 year old male with diabetes hypertension BMI 46 history of congestive heart failure brought on by pneumonia ejection fraction 40 to 45% by echo history of May 2020 with non-ST heart insult  Comes in today complaint 1 month history of pain in his right leg this includes pain in the medial groin which runs down the leg pain in the right knee on the medial side of the knee and radiating pain from the right leg which goes all the way down to the lateral side of the foot  He was treated with hydrocodone and steroids the steroids did not help much but the hydrocodone did Note repeat echo 07/28/2019 ejection fraction now 55 to 60% Also have stress test in May 2020 no ST segment deviation during the stress test Review of Systems  Cardiovascular: Positive for leg swelling.  All other systems reviewed and are negative.    Past Medical History:  Diagnosis Date  . CHF (congestive heart failure) ([place])    a. EF 40-45% in 03/2019 with NST showing no reversible ischemia  . Diabetes mellitus without complication ([place])   . Gout   . Hyperlipidemia   . Hypertension   . PNA (pneumonia)     Past Surgical History:  Procedure Laterality Date  . CATARACT EXTRACTION W/PHACO Left 10/29/2018   Procedure: CATARACT EXTRACTION PHACO AND INTRAOCULAR LENS PLACEMENT (IOC);  Surgeon: Tonny Branch, MD;  Location: [facility] OR[facility];  Service: Ophthalmology;  Laterality: Left;  CDE: 43.37  . COLONOSCOPY N/A 02/27/2014   Procedure: COLONOSCOPY;  Surgeon: Rogene Houston, MD;  Location: [facility] SU[facility];  Service: Endoscopy;  Laterality: N/A;  830  . NO PAST SURGERIES      Family History  Problem Relation Age of Onset  . Diabetes Other   . Diabetes Sister   . Colon cancer Neg Hx    Social History   Tobacco Use  . Smoking status: Former Smoker    Packs/day: 0.25    Years: 1.00    Pack years:  0.25    Types: Cigarettes    Quit date: 11/30/1998    Years since quitting: 21.5  . Smokeless tobacco: Never Used  Vaping Use  . Vaping Use: Never used  Substance Use Topics  . Alcohol use: No  . Drug use: No    No Known Allergies  Current Meds  Medication Sig  . allopurinol (ZYLOPRIM) 300 MG tablet Take 300 mg by mouth daily.    . carvedilol (COREG) 12.5 MG tablet TAKE 1 TABLET BY MOUTH TWICE DAILY WITH A MEAL  . furosemide (LASIX) 20 MG tablet Take 1 tablet (20 mg total) by mouth daily.  Marland Kitchen gabapentin (NEURONTIN) 300 MG capsule Take 300 mg by mouth 3 (three) times daily.   Marland Kitchen HYDROcodone-acetaminophen (NORCO/VICODIN) 5-325 MG tablet Take 1 tablet by mouth every 6 (six) hours as needed for severe pain.  . meclizine (ANTIVERT) 25 MG tablet Take 1 tablet (25 mg total) by mouth 3 (three) times daily as needed for dizziness.  . meloxicam (MOBIC) 15 MG tablet Take 15 mg by mouth daily.  . metFORMIN (GLUCOPHAGE) 1000 MG tablet Take 1,000 mg by mouth 2 (two) times daily.   . Omega-3 Fatty Acids (FISH OIL) 1000 MG CAPS Take 1,000 mg by mouth daily.  . potassium chloride (KLOR-CON) 10 MEQ tablet Take 1 tablet by mouth  once daily  . pravastatin (PRAVACHOL) 40 MG tablet Take 40 mg by mouth every morning.     BP 97/68   Pulse 90   Ht 5' 8.5" (1.74 m)   Wt (!) 307 lb (139.3 kg)   BMI 46.00 kg/m   Physical Exam Constitutional:      General: He is not in acute distress.    Appearance: He is well-developed.  Cardiovascular:     Comments: No peripheral edema Skin:    General: Skin is warm and dry.  Neurological:     Mental Status: He is alert and oriented to person, place, and time.     Sensory: No sensory deficit.     Coordination: Coordination normal.     Gait: Gait normal.     Deep Tendon Reflexes: Reflexes are normal and symmetric.     Ortho Exam  Rotation of the hips did not produce any groin pain he has good straight leg raises with no hip pain  Hip flexion was  asymptomatic as well except for some mild pain with internal rotation  Most of his knee pain was on the medial side it was palpably tender there was no effusion his flexion was 110 degrees with limitations due to leg morphology  His back was nontender on the right  MEDICAL DECISION MAKING  A.  Encounter Diagnoses  Name Primary?  . Chronic right-sided low back pain with right-sided sciatica   . Body mass index 45.0-49.9, adult ([place])   . Morbid obesity ([place])   . Arthritis of knee, right Yes  . Spondylolysis, lumbar region   . Spondylolisthesis, lumbosacral region     B. DATA ANALYSED:  Procedure note right knee injection   verbal consent was obtained to inject right knee joint  Timeout was completed to confirm the site of injection  The medications used were 40 mg of Depo-Medrol and 1% lidocaine 3 cc  Anesthesia was provided by ethyl chloride and the skin was prepped with alcohol.  After cleaning the skin with alcohol a 20-gauge needle was used to inject the right knee joint. There were no complications. A sterile bandage was applied.   IMAGING: Independent interpretation of images: Show that he had x-rays done at the hospital of his knee he has severe arthritis of his knee with a severely compromised medial compartment  His hip x-ray was essentially normal except for one small inferior osteophyte so the reading of moderately severe hip arthritis was incorrect   Orders: PT  Back x-ray: Spondylolysis and listhesis L4 on 5 degenerative disc disease L4-5  Outside records reviewed: ER record   C. MANAGEMENT patient is on hydrocodone his PDMP records indicate medication going back to 2019 on a consistent basis  Today's x-ray  No orders of the defined types were placed in this encounter.     Arther Abbott, MD  06/02/2020 4:44 PM

## 2020-06-02 NOTE — Addendum Note (Signed)
Addended byCandice Camp on: 06/02/2020 04:50 PM   Modules accepted: Orders

## 2020-06-02 NOTE — Patient Instructions (Addendum)
1.  You have arthritis of your knee  The best way to handle that is to lose weight and continue your meloxicam and norco   2.  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  The images of your hip at the hospital said she had moderately severe arthritis and you do not have very mild arthritis   Here is some information regarding spondylolysis and listhesis   Spondylolisthesis  Spondylolisthesis is when one of the bones in the spine (vertebra) slips forward and out of place. This commonly occurs in the lower back (lumbar spine), but it can happen anywhere along the spine. What are the causes? This condition may be caused by:  A break or crack (stress fracture) in a bone in the spine from doing sports or physical activities that: ? Put a lot of strain on the bones in the lower back. ? Involve repetitive overstretching (hyperextension) of the spine.  Injury (trauma) from an accident.  Wear and tear that happens as a person grows older. What increases the risk? The following factors may make you more likely to develop this condition:  Participating in sports or activities such as: ? Gymnastics. ? Figure skating. ? Weight lifting. ? Football.  Having a condition that affects the bones, such as osteoarthritis or cancer.  Being overweight. What are the signs or symptoms? Symptoms of this condition may include:  Mild to severe pain in the legs, lower back, or buttocks.  An abnormal way of walking (abnormal gait).  Poor posture.  Muscle stiffness, specifically in the hamstrings. The hamstrings are in the backs of the thighs.  Weakness, numbness, or a tingling sensation in the legs.  Neck pain, if the injury is at the top of the spine. Symptoms may get worse when standing, and they may temporarily get better when sitting down or bending  forward. In some cases, there may be no symptoms of this condition. How is this diagnosed? This condition may be diagnosed based on:  Your symptoms.  Your medical history.  A physical exam.  Imaging tests, such as: ? X-rays. ? CT scan. ? MRI. How is this treated? This condition may be treated by:  Resting.  Pain medicines.  NSAIDs, like ibuprofen, to help reduce swelling and discomfort.  Injections of medicine (cortisone) in your back. These injections can help to relieve pain and numbness.  A brace to stabilize and support your back.  Physical therapy. You may work with an occupational therapist or physical therapist who can teach you how to reduce pressure on your back while you do everyday activities.  Surgery. This may be needed if: ? Other treatment methods do not improve your condition. ? Your symptoms do not go away after 3-6 months. ? You have changes in control of your stool or urine. ? You are unable to walk or stand. ? You have severe pain. Follow these instructions at home: Medicines  Take over-the-counter and prescription medicines only as told by your health care provider.  Ask your health care provider if the medicine prescribed to you: ? Requires you to avoid driving or using heavy machinery. ? Can cause constipation. You may need to take these actions to prevent or treat constipation:  Drink enough fluid to keep your urine pale yellow.  Take over-the-counter or prescription medicines.  Eat foods that are high in  fiber, such as beans, whole grains, and fresh fruits and vegetables.  Limit foods that are high in fat and processed sugars, such as fried or sweet foods. If you have a brace:  Wear the brace as told by your health care provider. Remove it only as told by your health care provider.  Keep the brace clean.  If the brace is not waterproof: ? Do not let it get wet. ? Cover it with a watertight covering when you take a bath or a  shower. Activity  Rest and return to your normal activities as told by your health care provider. Ask your health care provider what activities are safe for you.  Ask your health care provider when it is safe to drive if you have a back brace.  Work with a physical therapist to make a safe exercise program, as recommended by your health care provider. Do exercises as told by your physical therapist. This may include exercises to strengthen your back and abdominal muscles (core exercises). Managing pain, stiffness, and swelling      If directed, put ice on the affected area. ? If you have a removable brace, remove it as told by your health care provider. ? Put ice in a plastic bag. ? Place a towel between your skin and the bag. ? Leave the ice on for 20 minutes, 2-3 times a day.  If directed, apply heat to the affected area as often as told by your health care provider. Use the heat source that your health care provider recommends, such as a moist heat pack or a heating pad. ? If you have a removable brace, remove it as told by your health care provider. ? Place a towel between your skin and the heat source. ? Leave the heat on for 20-30 minutes. ? Remove the heat if your skin turns bright red. This is especially important if you are unable to feel pain, heat, or cold. You may have a greater risk of getting burned. General instructions  Do not use any products that contain nicotine or tobacco, such as cigarettes, e-cigarettes, and chewing tobacco. These can delay bone healing. If you need help quitting, ask your health care provider.  If you are overweight, work with your health care provider and a dietitian to set a weight-loss goal that is healthy and reasonable for you.  Keep all follow-up visits as told by your health care provider. This is important. Contact a health care provider if:  You have pain that gets worse or does not get better. Get help right away if:  You have  severe back or neck pain.  You have changes in control of your stool or urine.  You develop weakness or numbness in your legs.  You are unable to stand or walk. Summary  Spondylolisthesis is when one of the bones in the spine (vertebra) slips forward and out of place.  This condition may be treated with rest, medicines, wearing a brace, physical therapy, or surgery.  Rest and return to your normal activities as told by your health care provider. Ask your health care provider what activities are safe for you.  Contact a health care provider if you have pain that gets worse or does not get better. This information is not intended to replace advice given to you by your health care provider. Make sure you discuss any questions you have with your health care provider. Document Revised: 02/14/2019 Document Reviewed: 05/29/2018 Elsevier Patient Education  [place].  Spondylolysis  Spondylolysis is a small break or crack (stress fracture) in a bone in the spine (vertebra) in the lower back (lumbar spine). The stress fracture occurs on the bony mass between and behind the vertebra. Spondylolysis may be caused by an injury (trauma) or by overuse. Since the lower back is almost always under pressure from daily living, this stress fracture usually does not heal normally. Spondylolysis may eventually cause one vertebra to slip forward and out of place (spondylolisthesis). What are the causes? This condition may be caused by:  Trauma, such as a fall.  Excessive wear and tear. This is often a result of doing sports or physical activities that involve repetitive overstretching (hyperextension) and rotation of the spine. What increases the risk?  You are more likely to develop this condition if you have: ? A family history of this condition. ? An inward curvature of your spine (lordosis). ? A condition that affects your spine, such as spina bifida. You are more likely to develop this  condition if you participate in:  Gymnastics.  Dance.  Football.  Wrestling.  Martial arts.  Weight lifting.  Tennis.  Swimming. What are the signs or symptoms? Symptoms of this condition may include:  Long-lasting (chronic) pain in the lower back.  Stiffness in the back or legs.  Tightness in the hamstring muscles, which are in the backs of the thighs. In some cases, there may be no symptoms of this condition. How is this diagnosed? This condition may be diagnosed based on:  Your symptoms.  Your medical history.  A physical exam.  Imaging tests, such as: ? X-rays. ? CT scan. ? MRI. How is this treated? This condition may be treated by:  Resting. You may be asked to avoid or modify activities that put strain on your back until your symptoms improve.  Medicines to help relieve pain.  NSAIDs to help reduce swelling and discomfort.  Injections of medicine (cortisone) in your back. These injections can help to relieve pain and numbness.  A brace to stabilize and support your back.  Physical therapy. You may work with an occupational therapist or physical therapist who can teach you how to reduce pressure on your back while you do everyday activities.  Surgery. This may be needed if you have: ? A severe injury. ? Pain that lasts for more than 6 months. ? Numbness in your pelvic region. ? Changes in control of your stool or urine. Follow these instructions at home: Medicines  Take over-the-counter and prescription medicines only as told by your health care provider.  Ask your health care provider if the medicine prescribed to you: ? Requires you to avoid driving or using heavy machinery. ? Can cause constipation. You may need to take these actions to prevent or treat constipation:  Drink enough fluid to keep your urine pale yellow.  Take over-the-counter or prescription medicines.  Eat foods that are high in fiber, such as beans, whole grains, and  fresh fruits and vegetables.  Limit foods that are high in fat and processed sugars, such as fried or sweet foods. If you have a brace:  Wear the brace as told by your health care provider. Remove it only as told by your health care provider.  Keep the brace clean.  If the brace is not waterproof: ? Do not let it get wet. ? Cover it with a watertight covering when you take a bath or a shower. Activity  Rest and return to your normal activities as  told by your health care provider. Ask your health care provider what activities are safe for you.  Ask your health care provider when it is safe to drive if you have a back brace.  Work with a physical therapist to make a safe exercise program, as recommended by your health care provider. Do exercises as told by your physical therapist. This may include exercises to strengthen your back and abdominal muscles (core exercises). Managing pain, stiffness, and swelling      If directed, put ice on the affected area. ? If you have a removable brace, remove it as told by your health care provider. ? Put ice in a plastic bag. ? Place a towel between your skin and the bag. ? Leave the ice on for 20 minutes, 2-3 times a day.  If directed, apply heat to the affected area as often as told by your health care provider. Use the heat source that your health care provider recommends, such as a moist heat pack or a heating pad. ? If you have a removable brace, remove it as told by your health care provider. ? Place a towel between your skin and the heat source. ? Leave the heat on for 20-30 minutes. ? Remove the heat if your skin turns bright red. This is especially important if you are unable to feel pain, heat, or cold. You may have a greater risk of getting burned. General instructions  Do not use any products that contain nicotine or tobacco, such as cigarettes, e-cigarettes, and chewing tobacco. These can delay bone healing. If you need help  quitting, ask your health care provider.  Maintain a healthy weight. Extra weight puts stress on your back.  Keep all follow-up visits as told by your health care provider. This is important. Contact a health care provider if:  You have pain that gets worse or does not get better. Get help right away if:  You have severe back pain.  You have changes in control of your stool or urine.  You develop weakness or numbness in your legs.  You are unable to stand or walk. Summary  Spondylolysis is a small break or crack (stress fracture) in a bone in the spine (vertebra) in the lower back (lumbar spine).  This condition may be treated by resting, medicines, physical therapy, wearing a brace, or surgery.  Rest and return to your normal activities as told by your health care provider. Ask your health care provider what activities are safe for you.  Contact a health care provider if you have pain that gets worse or does not get better. This information is not intended to replace advice given to you by your health care provider. Make sure you discuss any questions you have with your health care provider. Document Revised: 02/14/2019 Document Reviewed: 05/29/2018 Elsevier Patient Education  [place].

## 2020-06-24 ENCOUNTER — Encounter (HOSPITAL_COMMUNITY): Payer: Self-pay | Admitting: Physical Therapy

## 2020-06-24 ENCOUNTER — Other Ambulatory Visit: Payer: Self-pay

## 2020-06-24 ENCOUNTER — Ambulatory Visit (HOSPITAL_COMMUNITY): Payer: Managed Care, Other (non HMO) | Attending: Orthopedic Surgery | Admitting: Physical Therapy

## 2020-06-24 DIAGNOSIS — R293 Abnormal posture: Secondary | ICD-10-CM | POA: Diagnosis present

## 2020-06-24 DIAGNOSIS — M5441 Lumbago with sciatica, right side: Secondary | ICD-10-CM | POA: Diagnosis not present

## 2020-06-24 NOTE — Therapy (Signed)
[place], [place], [address] Phone: [phone]   Fax:  [phone]  Physical Therapy Evaluation  Patient Details  Name: Roberto Guerra MRN: [id number] Date of Birth: [birth year] Referring Provider (PT): Arther Abbott MD    Encounter Date: 06/24/2020   PT End of Session - 06/24/20 1751    Visit Number 1    Number of Visits 12    Date for PT Re-Evaluation 08/07/20    Authorization Type [employer] Managed    Progress Note Due on Visit 10    PT Start Time 1720    PT Stop Time 1800    PT Time Calculation (min) 40 min    Activity Tolerance Patient tolerated treatment well;Patient limited by pain    Behavior During Therapy [facility] for tasks assessed/performed           Past Medical History:  Diagnosis Date  . CHF (congestive heart failure) ([place])    a. EF 40-45% in 03/2019 with NST showing no reversible ischemia  . Diabetes mellitus without complication ([place])   . Gout   . Hyperlipidemia   . Hypertension   . PNA (pneumonia)     Past Surgical History:  Procedure Laterality Date  . CATARACT EXTRACTION W/PHACO Left 10/29/2018   Procedure: CATARACT EXTRACTION PHACO AND INTRAOCULAR LENS PLACEMENT (IOC);  Surgeon: Tonny Branch, MD;  Location: [facility] ORS;  Service: Ophthalmology;  Laterality: Left;  CDE: 43.37  . COLONOSCOPY N/A 02/27/2014   Procedure: COLONOSCOPY;  Surgeon: Rogene Houston, MD;  Location: [facility] ENDO SUITE;  Service: Endoscopy;  Laterality: N/A;  830  . NO PAST SURGERIES      There were no vitals filed for this visit.    Subjective Assessment - 06/24/20 1718    Subjective Patient presents to physical therapy with complaint of LBP with RT side radiculopathy. Patient says this pain began insidiously about 2 months ago. Patient denies any prior issues with his back. Patient has taken pain medication and steroids which he says have not been very helpful., Patient has had recently xray imaging which showed grade 2  spondylolisthesis at L4-5 level.    Limitations Sitting;House hold activities;Lifting;Standing    Diagnostic tests xray    Currently in Pain? Yes    Pain Score 6     Pain Location Back    Pain Orientation Right;Posterior    Pain Descriptors / Indicators Shooting;Sharp    Pain Type Acute pain    Pain Radiating Towards RT foot    Pain Onset More than a month ago    Pain Frequency Constant    Aggravating Factors  walking    Pain Relieving Factors laying on side    Effect of Pain on Daily Activities Limits              [facility] PT Assessment - 06/24/20 0001      Assessment   Medical Diagnosis LBP with RT sciatica     Referring Provider (PT) Arther Abbott MD     Onset Date/Surgical Date --   2 months ago (June 2021)   Next MD Visit --   None scheduled    Prior Therapy No       Precautions   Precautions None      Restrictions   Weight Bearing Restrictions No      Balance Screen   Has the patient fallen in the past 6 months No      [place]  Private residence    Living Arrangements Spouse/significant other      Prior Function   Level of [place] Independent    Vocation Full time employment    Science writer   Overall Cognitive Status Within Functional Limits for tasks assessed      Observation/Other Assessments   Focus on Therapeutic Outcomes (FOTO)  60% limited       Posture/Postural Control   Posture/Postural Control Postural limitations    Postural Limitations Increased lumbar lordosis      ROM / Strength   AROM / PROM / Strength AROM;Strength      AROM   AROM Assessment Site Lumbar    Lumbar Flexion 50% limited    with pain    Lumbar Extension 25% limited    with pain    Lumbar - Right Side Bend 25% limited     Lumbar - Left Side Bend WFL   with pain    Lumbar - Right Rotation 25% limited     Lumbar - Left Rotation 25% limited       Strength   Strength Assessment Site  Hip;Knee;Ankle    Right/Left Hip Right;Left    Right Hip Flexion 3-/5    Right Hip Extension 3-/5    Right Hip ABduction 3-/5    Left Hip Flexion 3-/5    Left Hip Extension 3-/5    Left Hip ABduction 3-/5    Right/Left Knee Right;Left    Right Knee Extension 4+/5    Left Knee Extension 4/5    Right/Left Ankle Right;Left    Right Ankle Dorsiflexion 4+/5    Left Ankle Dorsiflexion 4/5      Palpation   Palpation comment Mod TTP about bilateral lumbar paraspinals and RT upper/ lateral gluteal       Special Tests   Other special tests --   (-) slump bilateral; SLR inconclusive (pain but not on opp)                     Objective measurements completed on examination: See above findings.               PT Education - 06/24/20 1719    Education Details on evaluation findings, POC and HEP    Person(s) Educated Patient    Methods Explanation;Handout    Comprehension Verbalized understanding            PT Short Term Goals - 06/24/20 1802      PT SHORT TERM GOAL #1   Title Patient will be independent with initial HEP and self-management strategies to improve functional outcomes    Time 3    Period Weeks    Status New    Target Date 07/17/20             PT Long Term Goals - 06/24/20 1802      PT LONG TERM GOAL #1   Title Patient will improve FOTO score by 10% to indicate improvement in functional outcomes    Time 6    Period Weeks    Status New    Target Date 08/07/20      PT LONG TERM GOAL #2   Title Patient will report at least 70% overall improvement in subjective complaint to indicate improvement in ability to perform ADLs.    Time 6    Period Weeks    Status New    Target Date 08/07/20  PT LONG TERM GOAL #3   Title Patient will improve lumbar AROM by 15% in all restricted planes for improved ability to perform functional mobility tasks and ADLs.    Time 6    Period Weeks    Status New    Target Date 08/07/20      PT LONG TERM  GOAL #4   Title Patient will be able to sit/stand > 45 minutes with pain not to exceed 3/10 in lumbar to improve ability to perform ADLs and work tasks.    Time 6    Period Weeks    Status New    Target Date 08/07/20                  Plan - 06/24/20 1754    Clinical Impression Statement Patient is a 59 y.o. male who presents to physical therapy with complaint of LBP with RT side radicular symptoms. Patient demonstrates decreased strength, ROM restriction, increased TTP and postural abnormalities which are likely contributing to symptoms of pain and are negatively impacting patient ability to perform ADLs and functional mobility tasks. Patient will benefit from skilled physical therapy services to address these deficits to reduce pain and improve level of function with ADLs and functional mobility tasks    Personal Factors and Comorbidities Comorbidity 3+    Comorbidities DM, HTN, Obesity    Examination-Activity Limitations Locomotion Level;Bend;Transfers;Squat;Stand;Stairs    Examination-Participation Restrictions Occupation;Community Activity;Cleaning;Yard Work    Stability/Clinical Decision Making Stable/Uncomplicated    Designer, jewellery Low    Rehab Potential Good    PT Frequency 2x / week    PT Duration 6 weeks    PT Treatment/Interventions ADLs/Self Care Home Management;Aquatic Therapy;Biofeedback;Cryotherapy;Orthotic Fit/Training;Therapeutic exercise;Balance training;DME Instruction;Electrical Stimulation;Iontophoresis 4mg /ml Dexamethasone;Moist Heat;Traction;Ultrasound;Parrafin;Fluidtherapy;Therapeutic activities;Patient/family education;Manual techniques;Functional mobility training;Stair training;Gait training;Neuromuscular re-education;Passive range of motion;Dry needling;Energy conservation;Splinting;Spinal Manipulations;Taping;Vasopneumatic Device;Joint Manipulations    PT Next Visit Plan Review goals and HEP. Progress core and hip strength on mat (glute set, ab  march, heel slides). Progress hip and lumbar mobility when able, then to WB and functional strengthening as tolerated    PT Home Exercise Plan 06/24/20: ab set, [place] with towel    Consulted and Agree with Plan of Care Patient           Patient will benefit from skilled therapeutic intervention in order to improve the following deficits and impairments:  Impaired flexibility, Decreased activity tolerance, Decreased range of motion, Decreased strength, Hypomobility, Pain, Improper body mechanics, Increased fascial restricitons, Decreased mobility, Postural dysfunction  Visit Diagnosis: Right-sided low back pain with right-sided sciatica, unspecified chronicity  Abnormal posture     Problem List Patient Active Problem List   Diagnosis Date Noted  . Atypical chest pain 06/10/2019  . Dyspnea 03/30/2019  . Tachycardia 03/30/2019  . Neuralgia of lower extremity 08/15/2012  . ERECTILE DYSFUNCTION 08/29/2008  . CERUMEN IMPACTION, BILATERAL 06/27/2008  . GOUT 09/21/2007  . Hyperlipidemia 10/12/2006  . OBESITY NOS 10/12/2006  . Essential hypertension 10/12/2006  . GERD 10/12/2006  . HIATAL HERNIA 10/12/2006   6:08 PM, 06/24/20 Josue Hector PT DPT  Physical Therapist with [facility]  [phone]   [facility] [address] [place], [place], [address] Phone: [phone]   Fax:  [phone]  Name: Roberto Guerra MRN: [id number] Date of Birth: 07/29/1961

## 2020-06-24 NOTE — Patient Instructions (Signed)
Access Code: [email or web address] URL: [email or web address] Date: 06/24/2020 Prepared by: Josue Hector  Exercises Supine Transversus Abdominis Bracing - Hands on Stomach - 2-3 x daily - 7 x weekly - 2 sets - 10 reps - 5 hold Hook Lying Single Knee to Chest Stretch with Towel - 2-3 x daily - 7 x weekly - 1 sets - 5 reps - 10 sec hold

## 2020-07-07 ENCOUNTER — Other Ambulatory Visit: Payer: Self-pay

## 2020-07-07 ENCOUNTER — Encounter (HOSPITAL_COMMUNITY): Payer: Self-pay

## 2020-07-07 ENCOUNTER — Ambulatory Visit (HOSPITAL_COMMUNITY): Payer: Managed Care, Other (non HMO)

## 2020-07-07 DIAGNOSIS — M5441 Lumbago with sciatica, right side: Secondary | ICD-10-CM | POA: Diagnosis not present

## 2020-07-07 DIAGNOSIS — R293 Abnormal posture: Secondary | ICD-10-CM

## 2020-07-07 NOTE — Therapy (Signed)
[place], [place], [address] Phone: [phone]   Fax:  [phone]  Physical Therapy Treatment  Patient Details  Name: Roberto Guerra MRN: [id number] Date of Birth: 08/28/1961 Referring Provider (PT): Arther Abbott MD    Encounter Date: 07/07/2020   PT End of Session - 07/07/20 1724    Visit Number 2    Number of Visits 12    Date for PT Re-Evaluation 08/07/20    Authorization Type [employer] Managed    Progress Note Due on Visit 10    PT Start Time 6962    PT Stop Time 1735    PT Time Calculation (min) 40 min    Activity Tolerance Patient tolerated treatment well;Patient limited by pain;No increased pain    Behavior During Therapy WFL for tasks assessed/performed           Past Medical History:  Diagnosis Date  . CHF (congestive heart failure) ([place])    a. EF 40-45% in 03/2019 with NST showing no reversible ischemia  . Diabetes mellitus without complication ([place])   . Gout   . Hyperlipidemia   . Hypertension   . PNA (pneumonia)     Past Surgical History:  Procedure Laterality Date  . CATARACT EXTRACTION W/PHACO Left 10/29/2018   Procedure: CATARACT EXTRACTION PHACO AND INTRAOCULAR LENS PLACEMENT (IOC);  Surgeon: Tonny Branch, MD;  Location: [facility];  Service: Ophthalmology;  Laterality: Left;  CDE: 43.37  . COLONOSCOPY N/A 02/27/2014   Procedure: COLONOSCOPY;  Surgeon: Rogene Houston, MD;  Location[facility];  Service: Endoscopy;  Laterality: N/A;  830  . NO PAST SURGERIES      There were no vitals filed for this visit.   Subjective Assessment - 07/07/20 1658    Subjective Pt stated he has some LBP 5-6/10 and radicular symptoms down to his feet on Rt LE.    Currently in Pain? Yes    Pain Score 6     Pain Location Back    Pain Orientation Lower    Pain Descriptors / Indicators Sharp    Pain Type Acute pain    Pain Radiating Towards Posterior Rt LE to foot    Pain Onset More than a month  ago    Pain Frequency Intermittent    Aggravating Factors  walking    Pain Relieving Factors laying on side    Effect of Pain on Daily Activities Limits                             [facility] Adult PT Treatment/Exercise - 07/07/20 0001      Posture/Postural Control   Posture/Postural Control Postural limitations    Postural Limitations Increased lumbar lordosis      Exercises   Exercises Lumbar      Lumbar Exercises: Stretches   Single Knee to Chest Stretch 2 reps;30 seconds    Single Knee to Chest Stretch Limitations with towel    Figure 4 Stretch 3 reps;30 seconds;Without overpressure      Lumbar Exercises: Supine   Ab Set 10 reps;3 seconds    AB Set Limitations paired with breathing to reduce valsamic maneuver    Bent Knee Raise 10 reps;5 seconds    Bent Knee Raise Limitations with ab set    Bridge 5 reps;5 seconds   2 sets   Other Supine Lumbar Exercises Decompression 2-5; 5 reps 3" holds    Other  Supine Lumbar Exercises glut set 10x 5"                  PT Education - 07/07/20 1705    Education Details Reviewed goals and educated importance of compliance with HEP.  Pt able to demonstrate a supine piriformis stretch, unable to recall the ab set or [facility] exercise,  Reviewed mechanics with breathing to reduce valsamic manuever paired with ab set to improve activation.  Educated importance of posture for pain control.    Person(s) Educated Patient    Methods Explanation;Verbal cues    Comprehension Verbalized understanding;Returned demonstration;Verbal cues required            PT Short Term Goals - 06/24/20 1802      PT SHORT TERM GOAL #1   Title Patient will be independent with initial HEP and self-management strategies to improve functional outcomes    Time 3    Period Weeks    Status New    Target Date 07/17/20             PT Long Term Goals - 06/24/20 1802      PT LONG TERM GOAL #1   Title Patient will improve FOTO score by 10% to  indicate improvement in functional outcomes    Time 6    Period Weeks    Status New    Target Date 08/07/20      PT LONG TERM GOAL #2   Title Patient will report at least 70% overall improvement in subjective complaint to indicate improvement in ability to perform ADLs.    Time 6    Period Weeks    Status New    Target Date 08/07/20      PT LONG TERM GOAL #3   Title Patient will improve lumbar AROM by 15% in all restricted planes for improved ability to perform functional mobility tasks and ADLs.    Time 6    Period Weeks    Status New    Target Date 08/07/20      PT LONG TERM GOAL #4   Title Patient will be able to sit/stand > 45 minutes with pain not to exceed 3/10 in lumbar to improve ability to perform ADLs and work tasks.    Time 6    Period Weeks    Status New    Target Date 08/07/20                 Plan - 07/07/20 1724    Clinical Impression Statement Reviewed goal, educated importnace of HEP compliance.  Pt unable to recall current HEP so reviewed wiht additional printout for follow thru.  Session focus on abdominal strengthening and proximal strengthening as well as mobility.  Pt required instructions for abdominal bracing paired wiht breathing. Added decompression exercises for postural and posterior chain strengthening.  EOS pt reports pain reduced.  Pt given folder with current HEP printout    Personal Factors and Comorbidities Comorbidity 3+    Comorbidities DM, HTN, Obesity    Examination-Activity Limitations Locomotion Level;Bend;Transfers;Squat;Stand;Stairs    Examination-Participation Restrictions Occupation;Community Activity;Cleaning;Yard Work    Stability/Clinical Decision Making Stable/Uncomplicated    Designer, jewellery Low    Rehab Potential Good    PT Frequency 2x / week    PT Duration 6 weeks    PT Treatment/Interventions ADLs/Self Care Home Management;Aquatic Therapy;Biofeedback;Cryotherapy;Orthotic Fit/Training;Therapeutic  exercise;Balance training;DME Instruction;Electrical Stimulation;Iontophoresis 4mg /ml Dexamethasone;Moist Heat;Traction;Ultrasound;Parrafin;Fluidtherapy;Therapeutic activities;Patient/family education;Manual techniques;Functional mobility training;Stair training;Gait training;Neuromuscular re-education;Passive range of motion;Dry needling;Energy conservation;Splinting;Spinal Manipulations;Taping;Vasopneumatic  Device;Joint Manipulations    PT Next Visit Plan Review compliance with HEP. Progress core and hip strength on mat.  Progress hip and lumbar mobility when able, then to WB and functional strengthening as tolerated    PT Home Exercise Plan 06/24/20: ab set, [place] with towel; 07/07/20: decompression           Patient will benefit from skilled therapeutic intervention in order to improve the following deficits and impairments:  Impaired flexibility, Decreased activity tolerance, Decreased range of motion, Decreased strength, Hypomobility, Pain, Improper body mechanics, Increased fascial restricitons, Decreased mobility, Postural dysfunction  Visit Diagnosis: Right-sided low back pain with right-sided sciatica, unspecified chronicity  Abnormal posture     Problem List Patient Active Problem List   Diagnosis Date Noted  . Atypical chest pain 06/10/2019  . Dyspnea 03/30/2019  . Tachycardia 03/30/2019  . Neuralgia of lower extremity 08/15/2012  . ERECTILE DYSFUNCTION 08/29/2008  . CERUMEN IMPACTION, BILATERAL 06/27/2008  . GOUT 09/21/2007  . Hyperlipidemia 10/12/2006  . OBESITY NOS 10/12/2006  . Essential hypertension 10/12/2006  . GERD 10/12/2006  . HIATAL HERNIA 10/12/2006   Ihor Austin, LPTA/CLT; CBIS [phone]  Aldona Lento 07/07/2020, 5:40 PM  [place] [address] [place], [place], [address] Phone: [phone]   Fax:  [phone]  Name: Roberto Guerra MRN: [id number] Date of Birth: [birth year]

## 2020-07-07 NOTE — Patient Instructions (Signed)
Isometric Abdominal    Lying on back with knees bent, breath in swelling belly then exhale with mouth as you tighten stomach by pressing elbows down.  Hold abdominal contraction for 5 seconds, continue breathing Repeat 10 times per set. Do 2 sets per session. Do 4 sessions per day.  http://orth.exer.us/1086   Copyright  VHI. All rights reserved.   Knee to Chest (Flexion)    Pull knee toward chest, may use towel to assist.  Feel stretch in lower back or buttock area.  Breathing deeply, Hold 30 seconds. Repeat with other knee. Repeat 3 times. Do 2 sessions per day.  http://gt2.exer.us/225   Copyright  VHI. All rights reserved.

## 2020-07-09 ENCOUNTER — Encounter (HOSPITAL_COMMUNITY): Payer: Self-pay

## 2020-07-09 ENCOUNTER — Other Ambulatory Visit: Payer: Self-pay

## 2020-07-09 ENCOUNTER — Ambulatory Visit (HOSPITAL_COMMUNITY): Payer: Managed Care, Other (non HMO) | Attending: Orthopedic Surgery

## 2020-07-09 DIAGNOSIS — R293 Abnormal posture: Secondary | ICD-10-CM | POA: Diagnosis present

## 2020-07-09 DIAGNOSIS — M5441 Lumbago with sciatica, right side: Secondary | ICD-10-CM | POA: Insufficient documentation

## 2020-07-09 NOTE — Patient Instructions (Signed)
Supine    Lie on back, legs bent and feet flat. With towel behind one leg and slowly try to straighten knee. Hold 30 seconds.  Repeat 3 times per session. Do 2 sessions per day.  Copyright  VHI. All rights reserved.   Lower Trunk Rotation Stretch    Keeping back flat and feet together, rotate knees to left side. Hold 10 seconds. Repeat 5 times per set. Do 2 sets per session.  http://orth.exer.us/122   Copyright  VHI. All rights reserved.

## 2020-07-09 NOTE — Therapy (Signed)
[place], [place], [address] Phone: [phone]   Fax:  [phone]  Physical Therapy Treatment  Patient Details  Name: Roberto Guerra MRN: [id number] Date of Birth: 02-05-1961 Referring Provider (PT): Arther Abbott MD    Encounter Date: 07/09/2020   PT End of Session - 07/09/20 1752    Visit Number 3    Number of Visits 12    Date for PT Re-Evaluation 08/07/20    Authorization Type [employer] Managed    Progress Note Due on Visit 10    PT Start Time 5638    PT Stop Time 7564    PT Time Calculation (min) 38 min    Activity Tolerance Patient limited by pain;No increased pain;Patient tolerated treatment well    Behavior During Therapy [facility] for tasks assessed/performed           Past Medical History:  Diagnosis Date  . CHF (congestive heart failure) ([place])    a. EF 40-45% in 03/2019 with NST showing no reversible ischemia  . Diabetes mellitus without complication ([place])   . Gout   . Hyperlipidemia   . Hypertension   . PNA (pneumonia)     Past Surgical History:  Procedure Laterality Date  . CATARACT EXTRACTION W/PHACO Left 10/29/2018   Procedure: CATARACT EXTRACTION PHACO AND INTRAOCULAR LENS PLACEMENT (IOC);  Surgeon: Tonny Branch, MD;  Location: [facility];  Service: Ophthalmology;  Laterality: Left;  CDE: 43.37  . COLONOSCOPY N/A 02/27/2014   Procedure: COLONOSCOPY;  Surgeon: Rogene Houston, MD;  [facility];  Service: Endoscopy;  Laterality: N/A;  830  . NO PAST SURGERIES      There were no vitals filed for this visit.   Subjective Assessment - 07/09/20 1750    Subjective Pt reports LBP 6/10 and radicular symptoms ended at posterior knee.    Currently in Pain? Yes    Pain Score 6     Pain Location Back    Pain Orientation Lower    Pain Descriptors / Indicators Dull;Aching    Pain Type Acute pain    Pain Radiating Towards Posterior Rt LE ending at knee    Pain Onset More than a month ago     Pain Frequency Intermittent    Aggravating Factors  walking    Pain Relieving Factors laying on side    Effect of Pain on Daily Activities limits                             [facility] Adult PT Treatment/Exercise - 07/09/20 0001      Posture/Postural Control   Posture/Postural Control Postural limitations    Postural Limitations Increased lumbar lordosis      Exercises   Exercises Lumbar      Lumbar Exercises: Stretches   Active Hamstring Stretch 2 reps;30 seconds    Active Hamstring Stretch Limitations supine wiht towel    Lower Trunk Rotation 5 reps;10 seconds      Lumbar Exercises: Seated   Other Seated Lumbar Exercises Postural awareness      Lumbar Exercises: Supine   Ab Set 10 reps;3 seconds    AB Set Limitations 2 sets, paired wiht exhale    Bent Knee Raise 10 reps;5 seconds    Bent Knee Raise Limitations with ab set    Bridge 10 reps;5 seconds    Bridge Limitations wiht ab set    Other Supine Lumbar Exercises  Decompression 1-4 with RTB                    PT Short Term Goals - 06/24/20 1802      PT SHORT TERM GOAL #1   Title Patient will be independent with initial HEP and self-management strategies to improve functional outcomes    Time 3    Period Weeks    Status New    Target Date 07/17/20             PT Long Term Goals - 06/24/20 1802      PT LONG TERM GOAL #1   Title Patient will improve FOTO score by 10% to indicate improvement in functional outcomes    Time 6    Period Weeks    Status New    Target Date 08/07/20      PT LONG TERM GOAL #2   Title Patient will report at least 70% overall improvement in subjective complaint to indicate improvement in ability to perform ADLs.    Time 6    Period Weeks    Status New    Target Date 08/07/20      PT LONG TERM GOAL #3   Title Patient will improve lumbar AROM by 15% in all restricted planes for improved ability to perform functional mobility tasks and ADLs.    Time 6     Period Weeks    Status New    Target Date 08/07/20      PT LONG TERM GOAL #4   Title Patient will be able to sit/stand > 45 minutes with pain not to exceed 3/10 in lumbar to improve ability to perform ADLs and work tasks.    Time 6    Period Weeks    Status New    Target Date 08/07/20                 Plan - 07/09/20 1829    Clinical Impression Statement Pt able to recall ab set, bridge and decompression exercises, stated he doesn't like the [facility] due to difficulty pulling.  Session focus on core and proximal strengthening wiht additional postural educated and strengthening.  Added hamstring stretch and LTR for mobility with reports of relief following.  Pt educated on importance of proper posture to reduce strain on back, educated purpose/benefits wiht lumbar support while sitting for long periods of time at work.  EOS no reoprts of LBP or radicular symptoms.    Personal Factors and Comorbidities Comorbidity 3+    Comorbidities DM, HTN, Obesity    Examination-Activity Limitations Locomotion Level;Bend;Transfers;Squat;Stand;Stairs    Examination-Participation Restrictions Occupation;Community Activity;Cleaning;Yard Work    Stability/Clinical Decision Making Stable/Uncomplicated    Designer, jewellery Low    Rehab Potential Good    PT Frequency 2x / week    PT Duration 6 weeks    PT Treatment/Interventions ADLs/Self Care Home Management;Aquatic Therapy;Biofeedback;Cryotherapy;Orthotic Fit/Training;Therapeutic exercise;Balance training;DME Instruction;Electrical Stimulation;Iontophoresis 4mg /ml Dexamethasone;Moist Heat;Traction;Ultrasound;Parrafin;Fluidtherapy;Therapeutic activities;Patient/family education;Manual techniques;Functional mobility training;Stair training;Gait training;Neuromuscular re-education;Passive range of motion;Dry needling;Energy conservation;Splinting;Spinal Manipulations;Taping;Vasopneumatic Device;Joint Manipulations    PT Next Visit Plan Begin postural  strengthening, clams and STS next session. Progress core and hip strength on mat.  Progress hip and lumbar mobility when able, then to WB and functional strengthening as tolerated    PT Home Exercise Plan 06/24/20: ab set, [place] with towel; 07/07/20: decompression; 07/09/20: bridge, hs stretch and LTR.  Importance of proper posture.           Patient will benefit  from skilled therapeutic intervention in order to improve the following deficits and impairments:  Impaired flexibility, Decreased activity tolerance, Decreased range of motion, Decreased strength, Hypomobility, Pain, Improper body mechanics, Increased fascial restricitons, Decreased mobility, Postural dysfunction  Visit Diagnosis: Abnormal posture  Right-sided low back pain with right-sided sciatica, unspecified chronicity     Problem List Patient Active Problem List   Diagnosis Date Noted  . Atypical chest pain 06/10/2019  . Dyspnea 03/30/2019  . Tachycardia 03/30/2019  . Neuralgia of lower extremity 08/15/2012  . ERECTILE DYSFUNCTION 08/29/2008  . CERUMEN IMPACTION, BILATERAL 06/27/2008  . GOUT 09/21/2007  . Hyperlipidemia 10/12/2006  . OBESITY NOS 10/12/2006  . Essential hypertension 10/12/2006  . GERD 10/12/2006  . HIATAL HERNIA 10/12/2006   Ihor Austin, LPTA/CLT; CBIS [phone]  Aldona Lento 07/09/2020, 6:36 PM  [place] [address] [place], [place], [address] Phone: [phone]   Fax:  [phone]  Name: ARLENE GENOVA MRN: [id number] Date of Birth: 1961/08/12

## 2020-07-15 ENCOUNTER — Ambulatory Visit (HOSPITAL_COMMUNITY): Payer: Managed Care, Other (non HMO)

## 2020-07-15 ENCOUNTER — Telehealth (HOSPITAL_COMMUNITY): Payer: Self-pay

## 2020-07-15 NOTE — Telephone Encounter (Signed)
pt cancelled appt because he had something else to do today

## 2020-07-17 ENCOUNTER — Ambulatory Visit (HOSPITAL_COMMUNITY): Payer: Managed Care, Other (non HMO)

## 2020-07-17 ENCOUNTER — Encounter (HOSPITAL_COMMUNITY): Payer: Self-pay

## 2020-07-17 ENCOUNTER — Other Ambulatory Visit: Payer: Self-pay

## 2020-07-17 DIAGNOSIS — M5441 Lumbago with sciatica, right side: Secondary | ICD-10-CM

## 2020-07-17 DIAGNOSIS — R293 Abnormal posture: Secondary | ICD-10-CM | POA: Diagnosis not present

## 2020-07-17 NOTE — Therapy (Signed)
[place], [place], [address] Phone: [phone]   Fax:  [phone]  Physical Therapy Treatment  Patient Details  Name: Roberto Guerra MRN: [id number] Date of Birth: 1961-05-18 Referring Provider (PT): Arther Abbott MD    Encounter Date: 07/17/2020   PT End of Session - 07/17/20 1753    Visit Number 4    Number of Visits 12    Date for PT Re-Evaluation 08/07/20    Authorization Type [employer] Managed    Progress Note Due on Visit 10    PT Start Time 1749    PT Stop Time 1827    PT Time Calculation (min) 38 min    Activity Tolerance Patient limited by pain;No increased pain;Patient tolerated treatment well    Behavior During Therapy [facility] for tasks assessed/performed           Past Medical History:  Diagnosis Date  . CHF (congestive heart failure) ([place])    a. EF 40-45% in 03/2019 with NST showing no reversible ischemia  . Diabetes mellitus without complication ([place])   . Gout   . Hyperlipidemia   . Hypertension   . PNA (pneumonia)     Past Surgical History:  Procedure Laterality Date  . CATARACT EXTRACTION W/PHACO Left 10/29/2018   Procedure: CATARACT EXTRACTION PHACO AND INTRAOCULAR LENS PLACEMENT (IOC);  Surgeon: Tonny Branch, MD;  [facility];  Service: Ophthalmology;  Laterality: Left;  CDE: 43.37  . COLONOSCOPY N/A 02/27/2014   Procedure: COLONOSCOPY;  Surgeon: Rogene Houston, MD;  [facility];  Service: Endoscopy;  Laterality: N/A;  830  . NO PAST SURGERIES      There were no vitals filed for this visit.   Subjective Assessment - 07/17/20 1751    Subjective Pt stated LBP 5/10 and radicular symptoms down to Rt calf.    Limitations Sitting;House hold activities;Lifting;Standing    Diagnostic tests xray    Currently in Pain? Yes    Pain Score 5     Pain Location Back    Pain Orientation Lower    Pain Descriptors / Indicators Dull;Aching    Pain Type Acute pain    Pain  Radiating Towards Posterior Rt LE ending at calf    Pain Onset More than a month ago    Pain Frequency Intermittent    Aggravating Factors  walking    Pain Relieving Factors laying on side    Effect of Pain on Daily Activities limits                             [facility] Adult PT Treatment/Exercise - 07/17/20 0001      Posture/Postural Control   Posture/Postural Control Postural limitations    Postural Limitations Increased lumbar lordosis      Exercises   Exercises Lumbar      Lumbar Exercises: Stretches   Active Hamstring Stretch 2 reps;30 seconds    Active Hamstring Stretch Limitations supine wiht towel    Lower Trunk Rotation 5 reps;10 seconds      Lumbar Exercises: Standing   Row Both;10 reps;Theraband    Theraband Level (Row) Level 2 (Red)    Shoulder Extension Both;10 reps;Theraband    Theraband Level (Shoulder Extension) Level 2 (Red)      Lumbar Exercises: Supine   Ab Set 10 reps;3 seconds    AB Set Limitations 2 sets, paired wiht exhale    Bent Knee  Raise 10 reps;5 seconds    Bent Knee Raise Limitations with ab set    Bridge 10 reps;5 seconds    Bridge Limitations wiht ab set; 2 sets      Lumbar Exercises: Sidelying   Clam 10 reps    Clam Limitations cueing for form                    PT Short Term Goals - 06/24/20 1802      PT SHORT TERM GOAL #1   Title Patient will be independent with initial HEP and self-management strategies to improve functional outcomes    Time 3    Period Weeks    Status New    Target Date 07/17/20             PT Long Term Goals - 06/24/20 1802      PT LONG TERM GOAL #1   Title Patient will improve FOTO score by 10% to indicate improvement in functional outcomes    Time 6    Period Weeks    Status New    Target Date 08/07/20      PT LONG TERM GOAL #2   Title Patient will report at least 70% overall improvement in subjective complaint to indicate improvement in ability to perform ADLs.    Time  6    Period Weeks    Status New    Target Date 08/07/20      PT LONG TERM GOAL #3   Title Patient will improve lumbar AROM by 15% in all restricted planes for improved ability to perform functional mobility tasks and ADLs.    Time 6    Period Weeks    Status New    Target Date 08/07/20      PT LONG TERM GOAL #4   Title Patient will be able to sit/stand > 45 minutes with pain not to exceed 3/10 in lumbar to improve ability to perform ADLs and work tasks.    Time 6    Period Weeks    Status New    Target Date 08/07/20                 Plan - 07/17/20 1828    Clinical Impression Statement Began postural strengthening wiht theraband and progressed gluteal strengthening with additional clams/STS.  Cueing for form and stability with new exercises as well as pairing exhale wiht abdominal sets.  Pt reports pain reduced to 3/10 and decreased radicular symptoms.    Personal Factors and Comorbidities Comorbidity 3+    Comorbidities DM, HTN, Obesity    Examination-Activity Limitations Locomotion Level;Bend;Transfers;Squat;Stand;Stairs    Examination-Participation Restrictions Occupation;Community Activity;Cleaning;Yard Work    Stability/Clinical Decision Making Stable/Uncomplicated    Designer, jewellery Low    Rehab Potential Good    PT Frequency 2x / week    PT Duration 6 weeks    PT Treatment/Interventions ADLs/Self Care Home Management;Aquatic Therapy;Biofeedback;Cryotherapy;Orthotic Fit/Training;Therapeutic exercise;Balance training;DME Instruction;Electrical Stimulation;Iontophoresis 4mg /ml Dexamethasone;Moist Heat;Traction;Ultrasound;Parrafin;Fluidtherapy;Therapeutic activities;Patient/family education;Manual techniques;Functional mobility training;Stair training;Gait training;Neuromuscular re-education;Passive range of motion;Dry needling;Energy conservation;Splinting;Spinal Manipulations;Taping;Vasopneumatic Device;Joint Manipulations    PT Next Visit Plan Begin prone  exercises next session.  Progress core and hip strength on mat.  Progress hip and lumbar mobility when able, then to WB and functional strengthening as tolerated    PT Home Exercise Plan 06/24/20: ab set, [place] with towel; 07/07/20: decompression; 07/09/20: bridge, hs stretch and LTR.  Importance of proper posture.  Patient will benefit from skilled therapeutic intervention in order to improve the following deficits and impairments:  Impaired flexibility, Decreased activity tolerance, Decreased range of motion, Decreased strength, Hypomobility, Pain, Improper body mechanics, Increased fascial restricitons, Decreased mobility, Postural dysfunction  Visit Diagnosis: Right-sided low back pain with right-sided sciatica, unspecified chronicity  Abnormal posture     Problem List Patient Active Problem List   Diagnosis Date Noted  . Atypical chest pain 06/10/2019  . Dyspnea 03/30/2019  . Tachycardia 03/30/2019  . Neuralgia of lower extremity 08/15/2012  . ERECTILE DYSFUNCTION 08/29/2008  . CERUMEN IMPACTION, BILATERAL 06/27/2008  . GOUT 09/21/2007  . Hyperlipidemia 10/12/2006  . OBESITY NOS 10/12/2006  . Essential hypertension 10/12/2006  . GERD 10/12/2006  . HIATAL HERNIA 10/12/2006   Ihor Austin, LPTA/CLT; CBIS [phone]  Aldona Lento 07/17/2020, 6:34 PM  [place] [address] [place], [place], [address] Phone: [phone]   Fax:  [phone]  Name: ABDIRAHIM FLAVELL MRN: [id number] Date of Birth: 05/17/1961

## 2020-07-21 ENCOUNTER — Encounter (HOSPITAL_COMMUNITY): Payer: Self-pay | Admitting: Physical Therapy

## 2020-07-21 ENCOUNTER — Ambulatory Visit (HOSPITAL_COMMUNITY): Payer: Managed Care, Other (non HMO) | Admitting: Physical Therapy

## 2020-07-21 ENCOUNTER — Other Ambulatory Visit: Payer: Self-pay

## 2020-07-21 DIAGNOSIS — R293 Abnormal posture: Secondary | ICD-10-CM

## 2020-07-21 DIAGNOSIS — M5441 Lumbago with sciatica, right side: Secondary | ICD-10-CM

## 2020-07-21 NOTE — Patient Instructions (Signed)
Access Code: [email or web address] URL: [email or web address] Date: 07/21/2020 Prepared by: Josue Hector  Exercises Active Straight Leg Raise with Quad Set - 2 x daily - 7 x weekly - 2 sets - 10 reps Dead Bug - 2 x daily - 7 x weekly - 2 sets - 10 reps

## 2020-07-21 NOTE — Therapy (Signed)
[place], [place], [address] Phone: [phone]   Fax:  [phone]  Physical Therapy Treatment  Patient Details  Name: Roberto Guerra MRN: [id number] Date of Birth: [birth year] Referring Provider (PT): Arther Abbott MD    Encounter Date: 07/21/2020   PT End of Session - 07/21/20 1734    Visit Number 5    Number of Visits 12    Date for PT Re-Evaluation 08/07/20    Authorization Type [employer] Managed    Progress Note Due on Visit 10    PT Start Time 3664    PT Stop Time 1808    PT Time Calculation (min) 38 min    Activity Tolerance No increased pain;Patient tolerated treatment well    Behavior During Therapy [facility] for tasks assessed/performed           Past Medical History:  Diagnosis Date   CHF (congestive heart failure) ([place])    a. EF 40-45% in 03/2019 with NST showing no reversible ischemia   Diabetes mellitus without complication (HCC)    Gout    Hyperlipidemia    Hypertension    PNA (pneumonia)     Past Surgical History:  Procedure Laterality Date   CATARACT EXTRACTION W/PHACO Left 10/29/2018   Procedure: CATARACT EXTRACTION PHACO AND INTRAOCULAR LENS PLACEMENT ([place]);  Surgeon: Tonny Branch, MD;  Location: [facility];  Service: Ophthalmology;  Laterality: Left;  CDE: 43.37   COLONOSCOPY N/A 02/27/2014   Procedure: COLONOSCOPY;  Surgeon: Rogene Houston, MD;  Location[facility];  Service: Endoscopy;  Laterality: N/A;  830   NO PAST SURGERIES      There were no vitals filed for this visit.   Subjective Assessment - 07/21/20 1733    Subjective Patient reports no new issues. Says he is doing better. No back pain currently.    Limitations Sitting;House hold activities;Lifting;Standing    Diagnostic tests xray    Currently in Pain? No/denies    Pain Onset More than a month ago              [facility] PT Assessment - 07/21/20 0001      Observation/Other Assessments   Focus on  Therapeutic Outcomes (FOTO)  41% limited   was 60% limited                         [facility] Adult PT Treatment/Exercise - 07/21/20 0001      Lumbar Exercises: Stretches   Single Knee to Chest Stretch 5 reps;10 seconds      Lumbar Exercises: Standing   Other Standing Lumbar Exercises standing hip abduction and extension x15 each       Lumbar Exercises: Seated   Sit to Stand 10 reps      Lumbar Exercises: Supine   Ab Set 10 reps;5 seconds    Bent Knee Raise 20 reps    Dead Bug 10 reps    Bridge 20 reps;5 seconds    Straight Leg Raise 10 reps                    PT Short Term Goals - 06/24/20 1802      PT SHORT TERM GOAL #1   Title Patient will be independent with initial HEP and self-management strategies to improve functional outcomes    Time 3    Period Weeks    Status New    Target Date 07/17/20  PT Long Term Goals - 06/24/20 1802      PT LONG TERM GOAL #1   Title Patient will improve FOTO score by 10% to indicate improvement in functional outcomes    Time 6    Period Weeks    Status New    Target Date 08/07/20      PT LONG TERM GOAL #2   Title Patient will report at least 70% overall improvement in subjective complaint to indicate improvement in ability to perform ADLs.    Time 6    Period Weeks    Status New    Target Date 08/07/20      PT LONG TERM GOAL #3   Title Patient will improve lumbar AROM by 15% in all restricted planes for improved ability to perform functional mobility tasks and ADLs.    Time 6    Period Weeks    Status New    Target Date 08/07/20      PT LONG TERM GOAL #4   Title Patient will be able to sit/stand > 45 minutes with pain not to exceed 3/10 in lumbar to improve ability to perform ADLs and work tasks.    Time 6    Period Weeks    Status New    Target Date 08/07/20                 Plan - 07/21/20 1815    Clinical Impression Statement Patient tolerated session well today with no  increased complaint of pain. Patient was well challenged with core progressions. Patient required verbal cues and demo for proper mechanics with dead bugs, and cues for avoiding trunk lean and external rotation during hip abductions. Patient educated on and issued updated HEP handout. Completed 5th visit FOTO at end of session today. Patient making good progress overall.    Personal Factors and Comorbidities Comorbidity 3+    Comorbidities DM, HTN, Obesity    Examination-Activity Limitations Locomotion Level;Bend;Transfers;Squat;Stand;Stairs    Examination-Participation Restrictions Occupation;Community Activity;Cleaning;Yard Work    Stability/Clinical Decision Making Stable/Uncomplicated    Rehab Potential Good    PT Frequency 2x / week    PT Duration 6 weeks    PT Treatment/Interventions ADLs/Self Care Home Management;Aquatic Therapy;Biofeedback;Cryotherapy;Orthotic Fit/Training;Therapeutic exercise;Balance training;DME Instruction;Electrical Stimulation;Iontophoresis 4mg /ml Dexamethasone;Moist Heat;Traction;Ultrasound;Parrafin;Fluidtherapy;Therapeutic activities;Patient/family education;Manual techniques;Functional mobility training;Stair training;Gait training;Neuromuscular re-education;Passive range of motion;Dry needling;Energy conservation;Splinting;Spinal Manipulations;Taping;Vasopneumatic Device;Joint Manipulations    PT Next Visit Plan Continue to progress hip and core strength as tolerated. Add step ups and palloff press next session.    PT Home Exercise Plan 06/24/20: ab set, [place] with towel; 07/07/20: decompression; 07/09/20: bridge, hs stretch and LTR.  Importance of proper posture. 07/21/20: deadbug, SLR    Consulted and Agree with Plan of Care Patient           Patient will benefit from skilled therapeutic intervention in order to improve the following deficits and impairments:  Impaired flexibility, Decreased activity tolerance, Decreased range of motion, Decreased strength,  Hypomobility, Pain, Improper body mechanics, Increased fascial restricitons, Decreased mobility, Postural dysfunction  Visit Diagnosis: Right-sided low back pain with right-sided sciatica, unspecified chronicity  Abnormal posture     Problem List Patient Active Problem List   Diagnosis Date Noted   Atypical chest pain 06/10/2019   Dyspnea 03/30/2019   Tachycardia 03/30/2019   Neuralgia of lower extremity 08/15/2012   ERECTILE DYSFUNCTION 08/29/2008   CERUMEN IMPACTION, BILATERAL 06/27/2008   GOUT 09/21/2007   Hyperlipidemia 10/12/2006   OBESITY NOS 10/12/2006   Essential hypertension  10/12/2006   GERD 10/12/2006   HIATAL HERNIA 10/12/2006    6:18 PM, 07/21/20 Josue Hector PT DPT  Physical Therapist with [facility]  [phone] [place] [address] [place], [place], [address] Phone: [phone]   Fax:  [phone]  Name: SABATINO WILLIARD MRN: [id number] Date of Birth: January 10, 1961

## 2020-07-28 ENCOUNTER — Ambulatory Visit (HOSPITAL_COMMUNITY): Payer: Managed Care, Other (non HMO) | Admitting: Physical Therapy

## 2020-07-28 ENCOUNTER — Encounter (HOSPITAL_COMMUNITY): Payer: Self-pay | Admitting: Physical Therapy

## 2020-07-28 ENCOUNTER — Other Ambulatory Visit: Payer: Self-pay

## 2020-07-28 DIAGNOSIS — R293 Abnormal posture: Secondary | ICD-10-CM | POA: Diagnosis not present

## 2020-07-28 DIAGNOSIS — M5441 Lumbago with sciatica, right side: Secondary | ICD-10-CM

## 2020-07-28 NOTE — Therapy (Signed)
[place], [place], [address] Phone: [phone]   Fax:  [phone]  Physical Therapy Treatment  Patient Details  Name: Roberto Guerra MRN: [id number] Date of Birth: February 21, 1961 Referring Provider (PT): Arther Abbott MD    Encounter Date: 07/28/2020   PT End of Session - 07/28/20 1736    Visit Number 6    Number of Visits 12    Date for PT Re-Evaluation 08/07/20    Authorization Type [employer] Managed    Progress Note Due on Visit 10    PT Start Time 0277    PT Stop Time 4128    PT Time Calculation (min) 40 min    Activity Tolerance No increased pain;Patient tolerated treatment well    Behavior During Therapy [facility] for tasks assessed/performed           Past Medical History:  Diagnosis Date   CHF (congestive heart failure) ([place])    a. EF 40-45% in 03/2019 with NST showing no reversible ischemia   Diabetes mellitus without complication (HCC)    Gout    Hyperlipidemia    Hypertension    PNA (pneumonia)     Past Surgical History:  Procedure Laterality Date   CATARACT EXTRACTION W/PHACO Left 10/29/2018   Procedure: CATARACT EXTRACTION PHACO AND INTRAOCULAR LENS PLACEMENT ([place]);  Surgeon: Tonny Branch, MD;  Location: [facility];  Service: Ophthalmology;  Laterality: Left;  CDE: 43.37   COLONOSCOPY N/A 02/27/2014   Procedure: COLONOSCOPY;  Surgeon: Rogene Houston, MD;  [facility];  Service: Endoscopy;  Laterality: N/A;  830   NO PAST SURGERIES      There were no vitals filed for this visit.   Subjective Assessment - 07/28/20 1735    Subjective Patient reports onset of back pain while sitting at home on Sunday. Says pain is about a 6 right now.    Limitations Sitting;House hold activities;Lifting;Standing    Diagnostic tests xray    Currently in Pain? Yes    Pain Score 6     Pain Location Back    Pain Orientation Posterior;Lower    Pain Descriptors / Indicators Aching    Pain Type  Acute pain    Pain Onset More than a month ago    Pain Frequency Intermittent                             [facility] Adult PT Treatment/Exercise - 07/28/20 0001      Lumbar Exercises: Standing   Other Standing Lumbar Exercises standing hip abduction and extension x15 each, palloff press RTB x 15 each      Other Standing Lumbar Exercises forward step ups on 4 inch box no HHA x 10 each       Lumbar Exercises: Seated   Sit to Stand 10 reps      Lumbar Exercises: Supine   Ab Set 10 reps;5 seconds    Bent Knee Raise 20 reps    Dead Bug 10 reps    Bridge 20 reps;5 seconds    Straight Leg Raise 15 reps      Manual Therapy   Manual Therapy Soft tissue mobilization    Manual therapy comments Cpmpleted separate from all other activity     Soft tissue mobilization messgae gun on LV 10 to bilateral lumbar paraspinals with pateitn in prone  PT Short Term Goals - 06/24/20 1802      PT SHORT TERM GOAL #1   Title Patient will be independent with initial HEP and self-management strategies to improve functional outcomes    Time 3    Period Weeks    Status New    Target Date 07/17/20             PT Long Term Goals - 06/24/20 1802      PT LONG TERM GOAL #1   Title Patient will improve FOTO score by 10% to indicate improvement in functional outcomes    Time 6    Period Weeks    Status New    Target Date 08/07/20      PT LONG TERM GOAL #2   Title Patient will report at least 70% overall improvement in subjective complaint to indicate improvement in ability to perform ADLs.    Time 6    Period Weeks    Status New    Target Date 08/07/20      PT LONG TERM GOAL #3   Title Patient will improve lumbar AROM by 15% in all restricted planes for improved ability to perform functional mobility tasks and ADLs.    Time 6    Period Weeks    Status New    Target Date 08/07/20      PT LONG TERM GOAL #4   Title Patient will be able to sit/stand >  45 minutes with pain not to exceed 3/10 in lumbar to improve ability to perform ADLs and work tasks.    Time 6    Period Weeks    Status New    Target Date 08/07/20                 Plan - 07/28/20 1815    Clinical Impression Statement Patetin tolerated session well today. Patient requires verbal cues for set up with table exercise and foot positioning during SLR and deadbugs. Patient notes increased muscle fatigue with dead bug and bridging. Progressed standing strengthening for core and glutes today. Patient educated on proper form and function of added palloff pressing, and cued to avoid trunk lean during hip extension in standing. Added message gun to lumbar paraspinals per patient subjective complaint of increased pain. Patient reports 0/10 pain post treatment.    Personal Factors and Comorbidities Comorbidity 3+    Comorbidities DM, HTN, Obesity    Examination-Activity Limitations Locomotion Level;Bend;Transfers;Squat;Stand;Stairs    Examination-Participation Restrictions Occupation;Community Activity;Cleaning;Yard Work    Stability/Clinical Decision Making Stable/Uncomplicated    Rehab Potential Good    PT Frequency 2x / week    PT Duration 6 weeks    PT Treatment/Interventions ADLs/Self Care Home Management;Aquatic Therapy;Biofeedback;Cryotherapy;Orthotic Fit/Training;Therapeutic exercise;Balance training;DME Instruction;Electrical Stimulation;Iontophoresis 4mg /ml Dexamethasone;Moist Heat;Traction;Ultrasound;Parrafin;Fluidtherapy;Therapeutic activities;Patient/family education;Manual techniques;Functional mobility training;Stair training;Gait training;Neuromuscular re-education;Passive range of motion;Dry needling;Energy conservation;Splinting;Spinal Manipulations;Taping;Vasopneumatic Device;Joint Manipulations    PT Next Visit Plan Continue to progress hip and core strength as tolerated. add machine walk outs and band sidestep next visit    PT Home Exercise Plan 06/24/20: ab set,  [place] with towel; 07/07/20: decompression; 07/09/20: bridge, hs stretch and LTR.  Importance of proper posture. 07/21/20: deadbug, SLR    Consulted and Agree with Plan of Care Patient           Patient will benefit from skilled therapeutic intervention in order to improve the following deficits and impairments:  Impaired flexibility, Decreased activity tolerance, Decreased range of motion, Decreased strength, Hypomobility, Pain, Improper body mechanics, Increased  fascial restricitons, Decreased mobility, Postural dysfunction  Visit Diagnosis: Right-sided low back pain with right-sided sciatica, unspecified chronicity  Abnormal posture     Problem List Patient Active Problem List   Diagnosis Date Noted   Atypical chest pain 06/10/2019   Dyspnea 03/30/2019   Tachycardia 03/30/2019   Neuralgia of lower extremity 08/15/2012   ERECTILE DYSFUNCTION 08/29/2008   CERUMEN IMPACTION, BILATERAL 06/27/2008   GOUT 09/21/2007   Hyperlipidemia 10/12/2006   OBESITY NOS 10/12/2006   Essential hypertension 10/12/2006   GERD 10/12/2006   HIATAL HERNIA 10/12/2006    6:20 PM, 07/28/20 Josue Hector PT DPT  Physical Therapist with [facility]  [phone] [place], [place], [address] Phone: [phone]   Fax:  [phone]  Name: Roberto Guerra MRN: [id number] Date of Birth: [birth year]

## 2020-07-29 ENCOUNTER — Telehealth (HOSPITAL_COMMUNITY): Payer: Self-pay | Admitting: Physical Therapy

## 2020-07-29 ENCOUNTER — Encounter (HOSPITAL_COMMUNITY): Payer: Managed Care, Other (non HMO) | Admitting: Physical Therapy

## 2020-07-29 NOTE — Telephone Encounter (Signed)
pt called to cx today's visit due to he has a conflict with another appt.

## 2020-08-04 ENCOUNTER — Other Ambulatory Visit: Payer: Self-pay

## 2020-08-04 ENCOUNTER — Ambulatory Visit (HOSPITAL_COMMUNITY): Payer: Managed Care, Other (non HMO) | Admitting: Physical Therapy

## 2020-08-04 ENCOUNTER — Encounter (HOSPITAL_COMMUNITY): Payer: Self-pay | Admitting: Physical Therapy

## 2020-08-04 DIAGNOSIS — M5441 Lumbago with sciatica, right side: Secondary | ICD-10-CM

## 2020-08-04 DIAGNOSIS — R293 Abnormal posture: Secondary | ICD-10-CM

## 2020-08-04 NOTE — Therapy (Signed)
[place] [address] [place], [place], [address] Phone: [phone]   Fax:  [phone]  Physical Therapy Treatment  Patient Details  Name: Roberto Guerra MRN: [id number] Date of Birth: November 30, 1960 Referring Provider (PT): Arther Abbott MD   Progress Note Reporting Period 06/24/20 to 08/04/20  See note below for Objective Data and Assessment of Progress/Goals.      Encounter Date: 08/04/2020   PT End of Session - 08/04/20 1719    Visit Number 7    Number of Visits 12    Date for PT Re-Evaluation 08/21/20    Authorization Type [employer] Managed    Progress Note Due on Visit 10    PT Start Time 1720    PT Stop Time 1800    PT Time Calculation (min) 40 min    Activity Tolerance No increased pain;Patient tolerated treatment well    Behavior During Therapy [facility] for tasks assessed/performed           Past Medical History:  Diagnosis Date  . CHF (congestive heart failure) ([place])    a. EF 40-45% in 03/2019 with NST showing no reversible ischemia  . Diabetes mellitus without complication ([place])   . Gout   . Hyperlipidemia   . Hypertension   . PNA (pneumonia)     Past Surgical History:  Procedure Laterality Date  . CATARACT EXTRACTION W/PHACO Left 10/29/2018   Procedure: CATARACT EXTRACTION PHACO AND INTRAOCULAR LENS PLACEMENT ([facility]);  Surgeon: Tonny Branch, MD;  Location: [facility];  Service: Ophthalmology;  Laterality: Left;  CDE: 43.37  . COLONOSCOPY N/A 02/27/2014   Procedure: COLONOSCOPY;  Surgeon: Rogene Houston, MD;  [facility];  Service: Endoscopy;  Laterality: N/A;  830  . NO PAST SURGERIES      There were no vitals filed for this visit.   Subjective Assessment - 08/04/20 1726    Subjective Patient says he feels alright. Says he had some pain going into legs the other day. Says his blood pressure was elevated and made an appointment to go see Dr Aline Brochure. Says his back is feeling pretty good today. Patient  says he feels about 70-75% improvement since starting therapy.    Limitations Sitting;House hold activities;Lifting;Standing    How long can you stand comfortably? 30 minutes    How long can you walk comfortably? 20 minutes    Diagnostic tests xray    Currently in Pain? Yes    Pain Score 5     Pain Location Back    Pain Orientation Posterior;Lower    Pain Descriptors / Indicators Aching    Pain Type Acute pain    Pain Onset More than a month ago    Pain Frequency Constant    Aggravating Factors  prolonged sitting, driving forklift at work    Pain Relieving Factors sitting reclined    Effect of Pain on Daily Activities Limiting              [facility] PT Assessment - 08/04/20 0001      Assessment   Medical Diagnosis LBP with RT sciatica     Referring Provider (PT) Arther Abbott MD     Prior Therapy No       Precautions   Precautions None      Restrictions   Weight Bearing Restrictions No      Home Environment   Living Environment Private residence    Living Arrangements Spouse/significant other      Prior Function  Level of Independence Independent      Cognition   Overall Cognitive Status Within Functional Limits for tasks assessed      Observation/Other Assessments   Focus on Therapeutic Outcomes (FOTO)  50% limited    was 41% limited      AROM   Lumbar Flexion 50% limited     Lumbar Extension 25% limited     Lumbar - Right Side Bend WFL   was 25% limited    Lumbar - Left Side Bend WFL    Lumbar - Right Rotation WFL    was 25% limited    Lumbar - Left Rotation WFL    was 25% limited                         [facility] Adult PT Treatment/Exercise - 08/04/20 0001      Lumbar Exercises: Stretches   Active Hamstring Stretch Right;Left;5 reps;10 seconds    Active Hamstring Stretch Limitations supine with towel       Lumbar Exercises: Seated   Sit to Stand 10 reps   with med ball hold (yellow)      Lumbar Exercises: Supine   Bent Knee Raise 20  reps    Dead Bug 10 reps    Bridge 20 reps;5 seconds      Manual Therapy   Manual Therapy Soft tissue mobilization    Manual therapy comments Completed separate from all other activity     Soft tissue mobilization message gun on LV 12 to bilateral lumbar paraspinals with pateint in prone                  PT Education - 08/04/20 1803    Education Details on progress toward therapy goals, reassessment findings, and access to [employer] for proper form with HEP exercise    Person(s) Educated Patient    Methods Explanation    Comprehension Verbalized understanding            PT Short Term Goals - 08/04/20 1805      PT SHORT TERM GOAL #1   Title Patient will be independent with initial HEP and self-management strategies to improve functional outcomes    Baseline Reports compliance but demos poor return    Time 3    Period Weeks    Status Partially Met    Target Date 07/17/20             PT Long Term Goals - 08/04/20 1806      PT LONG TERM GOAL #1   Title Patient will improve FOTO score by 10% to indicate improvement in functional outcomes    Baseline Current 9% improved    Time 6    Period Weeks    Status On-going      PT LONG TERM GOAL #2   Title Patient will report at least 70% overall improvement in subjective complaint to indicate improvement in ability to perform ADLs.    Baseline Reports 70-75% improvement    Time 6    Period Weeks    Status Achieved      PT LONG TERM GOAL #3   Title Patient will improve lumbar AROM by 15% in all restricted planes for improved ability to perform functional mobility tasks and ADLs.    Baseline See AROM    Time 6    Period Weeks    Status Partially Met      PT LONG TERM GOAL #4  Title Patient will be able to sit/stand > 45 minutes with pain not to exceed 3/10 in lumbar to improve ability to perform ADLs and work tasks.    Baseline Reports able to stand for 30 minutes without elevated pain    Time 6     Period Weeks    Status On-going                 Plan - 08/04/20 1804    Clinical Impression Statement Performed reassessment today. Patient shows moderate progress toward therapy goals. Patient does show slight improvement in lumbar mobility, and overall improvised in functional capacity per FOTO score. Patient continues to be limited by core weakness, and required frequent verbal cues for proper form and mechanics with previously established ther ex. Patient will continue to benefit from skilled therapy services to address ongoing deficits to reduce pain and improve LOF with ADLs, functional mobility and work tasks.    Personal Factors and Comorbidities Comorbidity 3+    Comorbidities DM, HTN, Obesity    Examination-Activity Limitations Locomotion Level;Bend;Transfers;Squat;Stand;Stairs    Examination-Participation Restrictions Occupation;Community Activity;Cleaning;Yard Work    Stability/Clinical Decision Making Stable/Uncomplicated    Rehab Potential Good    PT Frequency 2x / week    PT Duration 6 weeks    PT Treatment/Interventions ADLs/Self Care Home Management;Aquatic Therapy;Biofeedback;Cryotherapy;Orthotic Fit/Training;Therapeutic exercise;Balance training;DME Instruction;Electrical Stimulation;Iontophoresis 63m/ml Dexamethasone;Moist Heat;Traction;Ultrasound;Parrafin;Fluidtherapy;Therapeutic activities;Patient/family education;Manual techniques;Functional mobility training;Stair training;Gait training;Neuromuscular re-education;Passive range of motion;Dry needling;Energy conservation;Splinting;Spinal Manipulations;Taping;Vasopneumatic Device;Joint Manipulations    PT Next Visit Plan Continue to progress hip and core strength as tolerated. add machine walk outs and band sidestep next visit    PT Home Exercise Plan 06/24/20: ab set, [place]; 07/07/20: decompression; 07/09/20: bridge, hs stretch and LTR.  Importance of proper posture. 07/21/20: deadbug, SLR    Consulted and Agree  with Plan of Care Patient           Patient will benefit from skilled therapeutic intervention in order to improve the following deficits and impairments:  Impaired flexibility, Decreased activity tolerance, Decreased range of motion, Decreased strength, Hypomobility, Pain, Improper body mechanics, Increased fascial restricitons, Decreased mobility, Postural dysfunction  Visit Diagnosis: Right-sided low back pain with right-sided sciatica, unspecified chronicity  Abnormal posture     Problem List Patient Active Problem List   Diagnosis Date Noted  . Atypical chest pain 06/10/2019  . Dyspnea 03/30/2019  . Tachycardia 03/30/2019  . Neuralgia of lower extremity 08/15/2012  . ERECTILE DYSFUNCTION 08/29/2008  . CERUMEN IMPACTION, BILATERAL 06/27/2008  . GOUT 09/21/2007  . Hyperlipidemia 10/12/2006  . OBESITY NOS 10/12/2006  . Essential hypertension 10/12/2006  . GERD 10/12/2006  . HIATAL HERNIA 10/12/2006    6:08 PM, 08/04/20 CJosue HectorPT DPT  [facility] [phone]  [facility]SSur[address] N[place] [address]Phone: [phone]  Fax:  [phone] Name: RJADIN CREQUEMRN: [id number] of Birth: 804/19/1962

## 2020-08-06 ENCOUNTER — Ambulatory Visit (HOSPITAL_COMMUNITY): Payer: Managed Care, Other (non HMO)

## 2020-08-06 ENCOUNTER — Other Ambulatory Visit: Payer: Self-pay

## 2020-08-06 ENCOUNTER — Encounter (HOSPITAL_COMMUNITY): Payer: Self-pay

## 2020-08-06 DIAGNOSIS — M5441 Lumbago with sciatica, right side: Secondary | ICD-10-CM

## 2020-08-06 DIAGNOSIS — R293 Abnormal posture: Secondary | ICD-10-CM | POA: Diagnosis not present

## 2020-08-06 NOTE — Therapy (Signed)
[place], [place], [address] Phone: [phone]   Fax:  [phone]  Physical Therapy Treatment  Patient Details  Name: Roberto Guerra MRN: [id number] Date of Birth: 1961/02/17 Referring Provider (PT): Arther Abbott MD    Encounter Date: 08/06/2020   PT End of Session - 08/06/20 1748    Visit Number 8    Number of Visits 12    Date for PT Re-Evaluation 08/21/20    Authorization Type [employer] Managed    Progress Note Due on Visit 10    PT Start Time 2395    PT Stop Time 1818    PT Time Calculation (min) 38 min    Activity Tolerance Patient tolerated treatment well    Behavior During Therapy [facility] for tasks assessed/performed           Past Medical History:  Diagnosis Date  . CHF (congestive heart failure) ([place])    a. EF 40-45% in 03/2019 with NST showing no reversible ischemia  . Diabetes mellitus without complication ([place])   . Gout   . Hyperlipidemia   . Hypertension   . PNA (pneumonia)     Past Surgical History:  Procedure Laterality Date  . CATARACT EXTRACTION W/PHACO Left 10/29/2018   Procedure: CATARACT EXTRACTION PHACO AND INTRAOCULAR LENS PLACEMENT (IOC);  Surgeon: Tonny Branch, MD;  Location: [facility] OR[facility];  Service: Ophthalmology;  Laterality: Left;  CDE: 43.37  . COLONOSCOPY N/A 02/27/2014   Procedure: COLONOSCOPY;  Surgeon: Rogene Houston, MD;  Location: [facility] ENDO SUITE;  Service: Endoscopy;  Laterality: N/A;  830  . NO PAST SURGERIES      There were no vitals filed for this visit.   Subjective Assessment - 08/06/20 1745    Subjective Pt stated he is feeling good today, continues to have some pain in lower back.  Pain scale 4-5/10 LBP.    Currently in Pain? Yes    Pain Score 5     Pain Location Back    Pain Orientation Lower    Pain Descriptors / Indicators Aching;Sore    Pain Type Acute pain    Pain Radiating Towards No radicular symptoms currently    Pain Onset More than a month ago     Pain Frequency Constant    Aggravating Factors  prolonged sitting, driving forklift at work    Pain Relieving Factors sitting reclined                             [facility] Adult PT Treatment/Exercise - 08/06/20 0001      Posture/Postural Control   Posture/Postural Control Postural limitations    Postural Limitations Increased lumbar lordosis      Exercises   Exercises Lumbar      Lumbar Exercises: Machines for Strengthening   Other Lumbar Machine Exercise Bodycraft walk out with 2 PL 10x with core enganged      Lumbar Exercises: Standing   Other Standing Lumbar Exercises paloff RTB NBOS on foam 15x 2    Other Standing Lumbar Exercises sidestep with RTB 2RT                  PT Education - 08/06/20 1833    Education Details Discussed purchase of massage gun as well as tennis ball stretch supine.    Person(s) Educated Patient    Methods Explanation;Demonstration    Comprehension Verbalized understanding  PT Short Term Goals - 08/04/20 1805      PT SHORT TERM GOAL #1   Title Patient will be independent with initial HEP and self-management strategies to improve functional outcomes    Baseline Reports compliance but demos poor return    Time 3    Period Weeks    Status Partially Met    Target Date 07/17/20             PT Long Term Goals - 08/04/20 1806      PT LONG TERM GOAL #1   Title Patient will improve FOTO score by 10% to indicate improvement in functional outcomes    Baseline Current 9% improved    Time 6    Period Weeks    Status On-going      PT LONG TERM GOAL #2   Title Patient will report at least 70% overall improvement in subjective complaint to indicate improvement in ability to perform ADLs.    Baseline Reports 70-75% improvement    Time 6    Period Weeks    Status Achieved      PT LONG TERM GOAL #3   Title Patient will improve lumbar AROM by 15% in all restricted planes for improved ability to perform  functional mobility tasks and ADLs.    Baseline See AROM    Time 6    Period Weeks    Status Partially Met      PT LONG TERM GOAL #4   Title Patient will be able to sit/stand > 45 minutes with pain not to exceed 3/10 in lumbar to improve ability to perform ADLs and work tasks.    Baseline Reports able to stand for 30 minutes without elevated pain    Time 6    Period Weeks    Status On-going                 Plan - 08/06/20 1825    Clinical Impression Statement Added sidestep with theraband and walkout for core and proximal strengthening, min cueing for core activation prior movement.  Added marching for core strengtheing with some difficulty during SLS, required min UE A.  No reports of increased pain.  EOS with manual to address tight paraspinals, pt educated on self care technqiues with purchase of massage gun and stretch with tennis balls.    Personal Factors and Comorbidities Comorbidity 3+    Comorbidities DM, HTN, Obesity    Examination-Activity Limitations Locomotion Level;Bend;Transfers;Squat;Stand;Stairs    Examination-Participation Restrictions Occupation;Community Activity;Cleaning;Yard Work    Stability/Clinical Decision Making Stable/Uncomplicated    Designer, jewellery Low    Rehab Potential Good    PT Frequency 2x / week    PT Duration 6 weeks    PT Treatment/Interventions ADLs/Self Care Home Management;Aquatic Therapy;Biofeedback;Cryotherapy;Orthotic Fit/Training;Therapeutic exercise;Balance training;DME Instruction;Electrical Stimulation;Iontophoresis 29m/ml Dexamethasone;Moist Heat;Traction;Ultrasound;Parrafin;Fluidtherapy;Therapeutic activities;Patient/family education;Manual techniques;Functional mobility training;Stair training;Gait training;Neuromuscular re-education;Passive range of motion;Dry needling;Energy conservation;Splinting;Spinal Manipulations;Taping;Vasopneumatic Device;Joint Manipulations    PT Next Visit Plan Continue to progress hip and core  strength as tolerated.  Add vector stance and core stability exercises.    PT Home Exercise Plan 06/24/20: ab set, [place] towel; 07/07/20: decompression; 07/09/20: bridge, hs stretch and LTR.  Importance of proper posture. 07/21/20: deadbug, SLR           Patient will benefit from skilled therapeutic intervention in order to improve the following deficits and impairments:  Impaired flexibility, Decreased activity tolerance, Decreased range of motion, Decreased strength, Hypomobility, Pain, Improper body mechanics, Increased fascial  restricitons, Decreased mobility, Postural dysfunction  Visit Diagnosis: Abnormal posture  Right-sided low back pain with right-sided sciatica, unspecified chronicity     Problem List Patient Active Problem List   Diagnosis Date Noted  . Atypical chest pain 06/10/2019  . Dyspnea 03/30/2019  . Tachycardia 03/30/2019  . Neuralgia of lower extremity 08/15/2012  . ERECTILE DYSFUNCTION 08/29/2008  . CERUMEN IMPACTION, BILATERAL 06/27/2008  . GOUT 09/21/2007  . Hyperlipidemia 10/12/2006  . OBESITY NOS 10/12/2006  . Essential hypertension 10/12/2006  . GERD 10/12/2006  . HIATAL HERNIA 10/12/2006   Ihor Austin, LPTA/CLT; CBIS [phone]  Aldona Lento 08/06/2020, 6:36 PM  [facility] [address] [place], [place], [address] Phone: [phone]   Fax:  [phone]  Name: RODDERICK HOLTZER MRN: [id number] Date of Birth: 08/11/1961

## 2020-08-11 ENCOUNTER — Encounter (HOSPITAL_COMMUNITY): Payer: Self-pay | Admitting: Physical Therapy

## 2020-08-11 ENCOUNTER — Ambulatory Visit (HOSPITAL_COMMUNITY): Payer: Managed Care, Other (non HMO) | Attending: Orthopedic Surgery | Admitting: Physical Therapy

## 2020-08-11 ENCOUNTER — Other Ambulatory Visit: Payer: Self-pay

## 2020-08-11 DIAGNOSIS — R293 Abnormal posture: Secondary | ICD-10-CM | POA: Insufficient documentation

## 2020-08-11 DIAGNOSIS — M5441 Lumbago with sciatica, right side: Secondary | ICD-10-CM | POA: Insufficient documentation

## 2020-08-11 NOTE — Patient Instructions (Signed)
Access Code: [email or web address] URL: [email or web address] Date: 08/11/2020 Prepared by: Josue Hector  Exercises Standing Row with Anchored Resistance - 2 x daily - 7 x weekly - 2 sets - 10 reps Shoulder Extension with Resistance - 2 x daily - 7 x weekly - 2 sets - 10 reps Standing Anti-Rotation Press with Anchored Resistance - 2 x daily - 7 x weekly - 2 sets - 10 reps

## 2020-08-11 NOTE — Therapy (Signed)
[place], [place], [address] Phone: [phone]   Fax:  [phone]  Physical Therapy Treatment  Patient Details  Name: Roberto Guerra MRN: [id number] Date of Birth: 1961/09/23 Referring Provider (PT): Arther Abbott MD    Encounter Date: 08/11/2020   PT End of Session - 08/11/20 1736    Visit Number 9    Number of Visits 12    Date for PT Re-Evaluation 08/21/20    Authorization Type [employer] Managed    Progress Note Due on Visit 10    PT Start Time 1732    PT Stop Time 1810    PT Time Calculation (min) 38 min    Activity Tolerance Patient tolerated treatment well;Patient limited by fatigue    Behavior During Therapy [facility] for tasks assessed/performed           Past Medical History:  Diagnosis Date  . CHF (congestive heart failure) ([place])    a. EF 40-45% in 03/2019 with NST showing no reversible ischemia  . Diabetes mellitus without complication ([place])   . Gout   . Hyperlipidemia   . Hypertension   . PNA (pneumonia)     Past Surgical History:  Procedure Laterality Date  . CATARACT EXTRACTION W/PHACO Left 10/29/2018   Procedure: CATARACT EXTRACTION PHACO AND INTRAOCULAR LENS PLACEMENT (IOC);  Surgeon: Tonny Branch, MD;  Location: [facility];  Service: Ophthalmology;  Laterality: Left;  CDE: 43.37  . COLONOSCOPY N/A 02/27/2014   Procedure: COLONOSCOPY;  Surgeon: Rogene Houston, MD;  Location[facility];  Service: Endoscopy;  Laterality: N/A;  830  . NO PAST SURGERIES      There were no vitals filed for this visit.   Subjective Assessment - 08/11/20 1735    Subjective Patient says he is doing good today. Says back is doing better, "about a 4"    Currently in Pain? Yes    Pain Score 4     Pain Location Back    Pain Orientation Lower    Pain Descriptors / Indicators Aching    Pain Onset More than a month ago                             [facility] Adult PT Treatment/Exercise - 08/11/20  0001      Exercises   Exercises Knee/Hip      Lumbar Exercises: Machines for Strengthening   Other Lumbar Machine Exercise Bodycraft walk out with 3 PL 10x with core enganged    Other Lumbar Machine Exercise palloff walkouts with 2# x 10 each       Lumbar Exercises: Standing   Heel Raises 20 reps    Functional Squats 20 reps   with yellow med ball reach    Row 20 reps;Theraband    Theraband Level (Row) Level 3 (Green)    Shoulder Extension Both;20 reps;Theraband    Theraband Level (Shoulder Extension) Level 3 (Green)    Other Standing Lumbar Exercises paloff GTB x20 each     Other Standing Lumbar Exercises sidestep with RTB 3RT      Knee/Hip Exercises: Standing   Forward Step Up Both;1 set;15 reps;Hand Hold: 1;Step Height: 6"                    PT Short Term Goals - 08/04/20 1805      PT SHORT TERM GOAL #1   Title Patient will be independent with  initial HEP and self-management strategies to improve functional outcomes    Baseline Reports compliance but demos poor return    Time 3    Period Weeks    Status Partially Met    Target Date 07/17/20             PT Long Term Goals - 08/04/20 1806      PT LONG TERM GOAL #1   Title Patient will improve FOTO score by 10% to indicate improvement in functional outcomes    Baseline Current 9% improved    Time 6    Period Weeks    Status On-going      PT LONG TERM GOAL #2   Title Patient will report at least 70% overall improvement in subjective complaint to indicate improvement in ability to perform ADLs.    Baseline Reports 70-75% improvement    Time 6    Period Weeks    Status Achieved      PT LONG TERM GOAL #3   Title Patient will improve lumbar AROM by 15% in all restricted planes for improved ability to perform functional mobility tasks and ADLs.    Baseline See AROM    Time 6    Period Weeks    Status Partially Met      PT LONG TERM GOAL #4   Title Patient will be able to sit/stand > 45 minutes with  pain not to exceed 3/10 in lumbar to improve ability to perform ADLs and work tasks.    Baseline Reports able to stand for 30 minutes without elevated pain    Time 6    Period Weeks    Status On-going                 Plan - 08/11/20 1819    Clinical Impression Statement Patient tolerated ther ex progressions well today, but was well challenged and with noted fatigue post treatment. Patient showing overall improving in tolerance to functional activity and posturing during ther ex. Patient requires verbal cueing for core activation during functional activity and keeping good alignment of lumbar with shoulder position during palloff press and palloff walkouts. Patient cued on proper form and mechanics with chair squats with med ball reach. Patient educated on an issued updated HEP handout.    Personal Factors and Comorbidities Comorbidity 3+    Comorbidities DM, HTN, Obesity    Examination-Activity Limitations Locomotion Level;Bend;Transfers;Squat;Stand;Stairs    Examination-Participation Restrictions Occupation;Community Activity;Cleaning;Yard Work    Stability/Clinical Decision Making Stable/Uncomplicated    Rehab Potential Good    PT Frequency 2x / week    PT Duration 6 weeks    PT Treatment/Interventions ADLs/Self Care Home Management;Aquatic Therapy;Biofeedback;Cryotherapy;Orthotic Fit/Training;Therapeutic exercise;Balance training;DME Instruction;Electrical Stimulation;Iontophoresis 81m/ml Dexamethasone;Moist Heat;Traction;Ultrasound;Parrafin;Fluidtherapy;Therapeutic activities;Patient/family education;Manual techniques;Functional mobility training;Stair training;Gait training;Neuromuscular re-education;Passive range of motion;Dry needling;Energy conservation;Splinting;Spinal Manipulations;Taping;Vasopneumatic Device;Joint Manipulations    PT Next Visit Plan Continue to progress hip and core strength as tolerated.  Add vector stance and core stability exercises.    PT Home Exercise Plan  06/24/20: ab set, [place]; 07/07/20: decompression; 07/09/20: bridge, hs stretch and LTR.  Importance of proper posture. 07/21/20: deadbug, SLR 08/11/20: row, extension, palloff press    Consulted and Agree with Plan of Care Patient           Patient will benefit from skilled therapeutic intervention in order to improve the following deficits and impairments:  Impaired flexibility, Decreased activity tolerance, Decreased range of motion, Decreased strength, Hypomobility, Pain, Improper body mechanics, Increased fascial restricitons,  Decreased mobility, Postural dysfunction  Visit Diagnosis: Abnormal posture  Right-sided low back pain with right-sided sciatica, unspecified chronicity     Problem List Patient Active Problem List   Diagnosis Date Noted  . Atypical chest pain 06/10/2019  . Dyspnea 03/30/2019  . Tachycardia 03/30/2019  . Neuralgia of lower extremity 08/15/2012  . ERECTILE DYSFUNCTION 08/29/2008  . CERUMEN IMPACTION, BILATERAL 06/27/2008  . GOUT 09/21/2007  . Hyperlipidemia 10/12/2006  . OBESITY NOS 10/12/2006  . Essential hypertension 10/12/2006  . GERD 10/12/2006  . HIATAL HERNIA 10/12/2006   6:23 PM, 08/11/20 Josue Hector PT DPT  Physical Therapist with [facility]  [phone]   [facility] [address] [place], [place], [address] Phone: [phone]   Fax:  [phone]  Name: VLADIMIR LENHOFF MRN: [id number] Date of Birth: Nov 30, 1960

## 2020-08-14 ENCOUNTER — Ambulatory Visit (HOSPITAL_COMMUNITY): Payer: Managed Care, Other (non HMO)

## 2020-08-16 ENCOUNTER — Encounter (HOSPITAL_COMMUNITY): Payer: Self-pay | Admitting: Emergency Medicine

## 2020-08-16 ENCOUNTER — Other Ambulatory Visit: Payer: Self-pay

## 2020-08-16 ENCOUNTER — Emergency Department (HOSPITAL_COMMUNITY)
Admission: EM | Admit: 2020-08-16 | Discharge: 2020-08-16 | Disposition: A | Discharge status: Home / Self Care | Payer: Managed Care, Other (non HMO) | Attending: Emergency Medicine | Admitting: Emergency Medicine

## 2020-08-16 DIAGNOSIS — M5459 Other low back pain: Secondary | ICD-10-CM | POA: Insufficient documentation

## 2020-08-16 DIAGNOSIS — G8929 Other chronic pain: Secondary | ICD-10-CM | POA: Diagnosis not present

## 2020-08-16 DIAGNOSIS — Z87891 Personal history of nicotine dependence: Secondary | ICD-10-CM | POA: Insufficient documentation

## 2020-08-16 DIAGNOSIS — Z7984 Long term (current) use of oral hypoglycemic drugs: Secondary | ICD-10-CM | POA: Diagnosis not present

## 2020-08-16 DIAGNOSIS — M549 Dorsalgia, unspecified: Secondary | ICD-10-CM | POA: Diagnosis present

## 2020-08-16 DIAGNOSIS — Z79899 Other long term (current) drug therapy: Secondary | ICD-10-CM | POA: Insufficient documentation

## 2020-08-16 DIAGNOSIS — M79604 Pain in right leg: Secondary | ICD-10-CM | POA: Diagnosis not present

## 2020-08-16 DIAGNOSIS — I509 Heart failure, unspecified: Secondary | ICD-10-CM | POA: Insufficient documentation

## 2020-08-16 DIAGNOSIS — E119 Type 2 diabetes mellitus without complications: Secondary | ICD-10-CM | POA: Diagnosis not present

## 2020-08-16 DIAGNOSIS — M5431 Sciatica, right side: Secondary | ICD-10-CM

## 2020-08-16 DIAGNOSIS — I110 Hypertensive heart disease with heart failure: Secondary | ICD-10-CM | POA: Insufficient documentation

## 2020-08-16 MED ORDER — PREDNISONE 10 MG PO TABS: 40.0000 mg | ORAL_TABLET | Freq: Every day | ORAL | 0 refills | Status: DC

## 2020-08-16 MED ORDER — PREDNISONE 50 MG PO TABS
60.0000 mg | ORAL_TABLET | Freq: Once | ORAL | Status: AC
  Administered 2020-08-16: 60 mg via ORAL
  Filled 2020-08-16: qty 1

## 2020-08-16 NOTE — Discharge Instructions (Signed)
Take the prednisone as directed you will take your first dose starting tomorrow already received your dose for today.  Make an appointment to follow-up with Dr. Aline Brochure.  Definitely need to get seen if this shooting pain into the right leg does not resolve by 2 weeks.  Return for any new or worse symptoms.

## 2020-08-16 NOTE — ED Triage Notes (Signed)
Pt states he has been having lower back pain that radiates down Rt leg into his ankle. States he is in PT for his back but that his leg started hurting Thursday. Denies any new injury.

## 2020-08-16 NOTE — ED Provider Notes (Signed)
[facility] EMERGENCY DEPARTMENT Provider Note   CSN: [id number] Arrival date & time: 08/16/20  0117     History Chief Complaint  Patient presents with  . Leg Pain    Roberto Guerra is a 59 y.o. male.  Patient with a complaint of shooting pain down the back of the right leg and into the foot since Thursday.  Patient with history of chronic back pain.  Followed by physical therapy as well as Dr. Aline Brochure from orthopedics.  Patient is on chronic pain medicine.  Received his last prescription set timbre 20th and that was a 30-day supply.  No fall or injury.  No incontinence.  No significant weakness to the right foot.        Past Medical History:  Diagnosis Date  . CHF (congestive heart failure) ([place])    a. EF 40-45% in 03/2019 with NST showing no reversible ischemia  . Diabetes mellitus without complication ([place])   . Gout   . Hyperlipidemia   . Hypertension   . PNA (pneumonia)     Patient Active Problem List   Diagnosis Date Noted  . Atypical chest pain 06/10/2019  . Dyspnea 03/30/2019  . Tachycardia 03/30/2019  . Neuralgia of lower extremity 08/15/2012  . ERECTILE DYSFUNCTION 08/29/2008  . CERUMEN IMPACTION, BILATERAL 06/27/2008  . GOUT 09/21/2007  . Hyperlipidemia 10/12/2006  . OBESITY NOS 10/12/2006  . Essential hypertension 10/12/2006  . GERD 10/12/2006  . HIATAL HERNIA 10/12/2006    Past Surgical History:  Procedure Laterality Date  . CATARACT EXTRACTION W/PHACO Left 10/29/2018   Procedure: CATARACT EXTRACTION PHACO AND INTRAOCULAR LENS PLACEMENT (IOC);  Surgeon: Tonny Branch, MD;  Location: [facility];  Service: Ophthalmology;  Laterality: Left;  CDE: 43.37  . COLONOSCOPY N/A 02/27/2014   Procedure: COLONOSCOPY;  Surgeon: Rogene Houston, MD;  Location: [facility] END[facility];  Service: Endoscopy;  Laterality: N/A;  830  . NO PAST SURGERIES         Family History  Problem Relation Age of Onset  . Diabetes Other   . Diabetes Sister   . Colon cancer Neg Hx      Social History   Tobacco Use  . Smoking status: Former Smoker    Packs/day: 0.25    Years: 1.00    Pack years: 0.25    Types: Cigarettes    Quit date: 11/30/1998    Years since quitting: 21.7  . Smokeless tobacco: Never Used  Vaping Use  . Vaping Use: Never used  Substance Use Topics  . Alcohol use: No  . Drug use: No    Home Medications Prior to Admission medications   Medication Sig Start Date End Date Taking? Authorizing Provider  allopurinol (ZYLOPRIM) 300 MG tablet Take 300 mg by mouth daily.      [provider]  carvedilol (COREG) 12.5 MG tablet TAKE 1 TABLET BY MOUTH TWICE DAILY WITH A MEAL 05/21/20   Arnoldo Lenis, MD  furosemide (LASIX) 20 MG tablet Take 1 tablet (20 mg total) by mouth daily. 03/26/20 06/24/20  Arnoldo Lenis, MD  gabapentin (NEURONTIN) 300 MG capsule Take 300 mg by mouth 3 (three) times daily.     [provider]  HYDROcodone-acetaminophen (NORCO/VICODIN) 5-325 MG tablet Take 1 tablet by mouth every 6 (six) hours as needed for severe pain.    [provider]  lisinopril (ZESTRIL) 40 MG tablet Take 1 tablet (40 mg total) by mouth daily. 08/02/19 03/26/20  Arnoldo Lenis, MD  meclizine Johnathan Hausen)  25 MG tablet Take 1 tablet (25 mg total) by mouth 3 (three) times daily as needed for dizziness. 03/13/20   Orpah Greek, MD  meloxicam (MOBIC) 15 MG tablet Take 15 mg by mouth daily.    [provider]  metFORMIN (GLUCOPHAGE) 1000 MG tablet Take 1,000 mg by mouth 2 (two) times daily.     [provider]  nitroGLYCERIN (NITROSTAT) 0.4 MG SL tablet Place 1 tablet (0.4 mg total) under the tongue every 5 (five) minutes as needed for chest pain (CP or SOB). 06/11/19 03/26/20  Manuella Ghazi, Pratik D, DO  Omega-3 Fatty Acids (FISH OIL) 1000 MG CAPS Take 1,000 mg by mouth daily.    [provider]  potassium chloride (KLOR-CON) 10 MEQ tablet Take 1 tablet by mouth once daily 05/06/20   Arnoldo Lenis, MD   pravastatin (PRAVACHOL) 40 MG tablet Take 40 mg by mouth every morning.     [provider]    Allergies    Patient has no known allergies.  Review of Systems   Review of Systems  Constitutional: Negative for chills and fever.  HENT: Negative for rhinorrhea and sore throat.   Eyes: Negative for visual disturbance.  Respiratory: Negative for cough and shortness of breath.   Cardiovascular: Negative for chest pain and leg swelling.  Gastrointestinal: Negative for abdominal pain, diarrhea, nausea and vomiting.  Genitourinary: Negative for dysuria.  Musculoskeletal: Positive for back pain. Negative for neck pain.  Skin: Negative for rash.  Neurological: Negative for dizziness, light-headedness and headaches.  Hematological: Does not bruise/bleed easily.  Psychiatric/Behavioral: Negative for confusion.    Physical Exam Updated Vital Signs BP (!) 177/93 (BP Location: Right Arm)   Pulse 70   Temp 98.4 F (36.9 C) (Oral)   Resp 18   Ht 1.676 m (5\' 6" )   Wt (!) 139.7 kg   SpO2 99%   BMI 49.71 kg/m   Physical Exam Vitals and nursing note reviewed.  Constitutional:      Appearance: Normal appearance. He is well-developed.  HENT:     Head: Normocephalic and atraumatic.  Eyes:     Extraocular Movements: Extraocular movements intact.     Conjunctiva/sclera: Conjunctivae normal.     Pupils: Pupils are equal, round, and reactive to light.  Cardiovascular:     Rate and Rhythm: Normal rate and regular rhythm.     Heart sounds: No murmur heard.   Pulmonary:     Effort: Pulmonary effort is normal. No respiratory distress.     Breath sounds: Normal breath sounds.  Abdominal:     Palpations: Abdomen is soft.     Tenderness: There is no abdominal tenderness.  Musculoskeletal:        General: Normal range of motion.     Cervical back: Normal range of motion and neck supple.     Comments: Good range of motion of the right leg.  Dorsalis pedis pulse intact.  Skin:     General: Skin is warm and dry.  Neurological:     General: No focal deficit present.     Mental Status: He is alert and oriented to person, place, and time.     Cranial Nerves: No cranial nerve deficit.     Sensory: No sensory deficit.     Motor: No weakness.     ED Results / Procedures / Treatments   Labs (all labs ordered are listed, but only abnormal results are displayed) Labs Reviewed - No data to display  EKG  None  Radiology No results found.  Procedures Procedures (including critical care time)  Medications Ordered in ED Medications - No data to display  ED Course  I have reviewed the triage vital signs and the nursing notes.  Pertinent labs & imaging results that were available during my care of the patient were reviewed by me and considered in my medical decision making (see chart for details).    MDM Rules/Calculators/A&P                           Final Clinical Impression(s) / ED Diagnoses Final diagnoses:  None   Patient with shooting pain into the right leg consistent with a right-sided sciatica.  Has longstanding chronic back pain.  The symptoms only been present since Thursday.  No significant motor or sensory deficit at this time.  No involvement of the left leg at all.  We will go ahead treat with prednisone have him follow-up with orthopedics.  Rx / DC Orders ED Discharge Orders    None       Fredia Sorrow, MD 08/16/20 [phone]

## 2020-08-16 NOTE — ED Notes (Signed)
Pt's bp elevated, Notifed edp, instructed pt to take his bp medication when he gets home and continue to monitor.  Follow up as needed.

## 2020-08-18 ENCOUNTER — Other Ambulatory Visit: Payer: Self-pay

## 2020-08-18 ENCOUNTER — Encounter (HOSPITAL_COMMUNITY): Payer: Self-pay | Admitting: Physical Therapy

## 2020-08-18 ENCOUNTER — Ambulatory Visit (HOSPITAL_COMMUNITY): Payer: Managed Care, Other (non HMO) | Admitting: Physical Therapy

## 2020-08-18 DIAGNOSIS — R293 Abnormal posture: Secondary | ICD-10-CM | POA: Diagnosis not present

## 2020-08-18 DIAGNOSIS — M5441 Lumbago with sciatica, right side: Secondary | ICD-10-CM

## 2020-08-18 NOTE — Therapy (Signed)
[facility] [address] [place], [place], [address] Phone: [phone]   Fax:  [phone]  Physical Therapy Treatment  Patient Details  Name: Roberto Guerra MRN: [id number] Date of Birth: 1960-12-24 Referring Provider (PT): Arther Abbott MD   PHYSICAL THERAPY DISCHARGE SUMMARY  Visits from Start of Care: 10  Current functional level related to goals / functional outcomes: See below    Remaining deficits: See below    Education / Equipment: See assessment  Plan: Patient agrees to discharge.  Patient goals were met. Patient is being discharged due to meeting the stated rehab goals.  ?????       Encounter Date: 08/18/2020   PT End of Session - 08/18/20 1751    Visit Number 10    Number of Visits 12    Date for PT Re-Evaluation 08/21/20    Authorization Type [employer] Managed    Progress Note Due on Visit 10    PT Start Time 1742    PT Stop Time 1810    PT Time Calculation (min) 28 min    Activity Tolerance Patient tolerated treatment well    Behavior During Therapy WFL for tasks assessed/performed           Past Medical History:  Diagnosis Date  . CHF (congestive heart failure) ([place])    a. EF 40-45% in 03/2019 with NST showing no reversible ischemia  . Diabetes mellitus without complication ([place])   . Gout   . Hyperlipidemia   . Hypertension   . PNA (pneumonia)     Past Surgical History:  Procedure Laterality Date  . CATARACT EXTRACTION W/PHACO Left 10/29/2018   Procedure: CATARACT EXTRACTION PHACO AND INTRAOCULAR LENS PLACEMENT (IOC);  Surgeon: Tonny Branch, MD;  Location: [facility];  Service: Ophthalmology;  Laterality: Left;  CDE: 43.37  . COLONOSCOPY N/A 02/27/2014   Procedure: COLONOSCOPY;  Surgeon: Rogene Houston, MD;  Location: [facility];  Service: Endoscopy;  Laterality: N/A;  830  . NO PAST SURGERIES      There were no vitals filed for this visit.   Subjective Assessment - 08/18/20 1749     Subjective Patient says he is doing much better. Says he went to ER over the weekend because he had shooting pain in his RLE. Says they gave him pills and he was fine, sent him home. Says he has not had any trouble since and is feeling pretty good today. No pain currently. Says he feels about 90% improved since starting therapy and feels ready for DC today.    Limitations Sitting;House hold activities;Lifting;Standing    Currently in Pain? No/denies    Pain Onset More than a month ago              [facility] PT Assessment - 08/18/20 0001      Assessment   Medical Diagnosis LBP with RT sciatica     Referring Provider (PT) Arther Abbott MD     Prior Therapy No       Precautions   Precautions None      Restrictions   Weight Bearing Restrictions No      Balance Screen   Has the patient fallen in the past 6 months No      [place] residence      Prior Function   Level of Independence Independent      Cognition   Overall Cognitive Status Within Functional Limits for tasks assessed  Observation/Other Assessments   Focus on Therapeutic Outcomes (FOTO)  28% limited    was 50% limited      AROM   Lumbar Flexion 25% limited    was 50% limited    Lumbar Extension WFL    was 25% limited    Lumbar - Right Side Bend WFL    Lumbar - Left Side Bend WFL    Lumbar - Right Rotation [facility]     Lumbar - Left Rotation [facility]                                  PT Education - 08/18/20 1823    Education Details on progress toward therapy golas, transition to DC and updated HEP handout    Person(s) Educated Patient    Methods Explanation;Handout    Comprehension Verbalized understanding            PT Short Term Goals - 08/18/20 1759      PT SHORT TERM GOAL #1   Title Patient will be independent with initial HEP and self-management strategies to improve functional outcomes    Baseline Reports compliance    Time 3    Period  Weeks    Status Achieved    Target Date 07/17/20             PT Long Term Goals - 08/18/20 1759      PT LONG TERM GOAL #1   Title Patient will improve FOTO score by 10% to indicate improvement in functional outcomes    Baseline Current 32% improved compared to intake    Time 6    Period Weeks    Status Achieved      PT LONG TERM GOAL #2   Title Patient will report at least 70% overall improvement in subjective complaint to indicate improvement in ability to perform ADLs.    Baseline Reports 90% improved    Time 6    Period Weeks    Status Achieved      PT LONG TERM GOAL #3   Title Patient will improve lumbar AROM by 15% in all restricted planes for improved ability to perform functional mobility tasks and ADLs.    Baseline See AROM    Time 6    Period Weeks    Status Achieved      PT LONG TERM GOAL #4   Title Patient will be able to sit/stand > 45 minutes with pain not to exceed 3/10 in lumbar to improve ability to perform ADLs and work tasks.    Baseline Reports able to sit >60 min wihtout issue, but can only stand 20-25 minutes at a time    Time 6    Period Weeks    Status Partially Met                 Plan - 08/18/20 1824    Clinical Impression Statement Patient has made good progress toward therapy goals. Patient currently with all goals met/ partially met. Patient continues to have occasional radicular symptoms that are described as insidious in nature, not brought on by any particular activity or movement, but he says these are well managed with current medications. Patient being DC today to transition to independent with HEP. Patient educated on and issued updated HEP handout. Patient instructed to follow up with therapy services with any further questions concerns.    Personal Factors and Comorbidities Comorbidity 3+  Comorbidities DM, HTN, Obesity    Examination-Activity Limitations Locomotion Level;Bend;Transfers;Squat;Stand;Stairs     Examination-Participation Restrictions Occupation;Community Activity;Cleaning;Yard Work    Stability/Clinical Decision Making Stable/Uncomplicated    Rehab Potential Good    PT Treatment/Interventions ADLs/Self Care Home Management;Aquatic Therapy;Biofeedback;Cryotherapy;Orthotic Fit/Training;Therapeutic exercise;Balance training;DME Instruction;Electrical Stimulation;Iontophoresis 52m/ml Dexamethasone;Moist Heat;Traction;Ultrasound;Parrafin;Fluidtherapy;Therapeutic activities;Patient/family education;Manual techniques;Functional mobility training;Stair training;Gait training;Neuromuscular re-education;Passive range of motion;Dry needling;Energy conservation;Splinting;Spinal Manipulations;Taping;Vasopneumatic Device;Joint Manipulations    PT Next Visit Plan DC to HEP    PT Home Exercise Plan 06/24/20: ab set, SBarrenwith towel; 07/07/20: decompression; 07/09/20: bridge, hs stretch and LTR.  Importance of proper posture. 07/21/20: deadbug, SLR 08/11/20: row, extension, palloff press    Consulted and Agree with Plan of Care Patient           Patient will benefit from skilled therapeutic intervention in order to improve the following deficits and impairments:  Impaired flexibility, Decreased activity tolerance, Decreased range of motion, Decreased strength, Hypomobility, Pain, Improper body mechanics, Increased fascial restricitons, Decreased mobility, Postural dysfunction  Visit Diagnosis: Abnormal posture  Right-sided low back pain with right-sided sciatica, unspecified chronicity     Problem List Patient Active Problem List   Diagnosis Date Noted  . Atypical chest pain 06/10/2019  . Dyspnea 03/30/2019  . Tachycardia 03/30/2019  . Neuralgia of lower extremity 08/15/2012  . ERECTILE DYSFUNCTION 08/29/2008  . CERUMEN IMPACTION, BILATERAL 06/27/2008  . GOUT 09/21/2007  . Hyperlipidemia 10/12/2006  . OBESITY NOS 10/12/2006  . Essential hypertension 10/12/2006  . GERD 10/12/2006  . HIATAL  HERNIA 10/12/2006    6:27 PM, 08/18/20 CJosue HectorPT DPT  Physical Therap[facility] ([phone]  [facility]SFortuna [place] [address]Phone: [phone]  Fax:  [phone] Name: Roberto BLOXHAMMRN: [id number] of Birth: [birth year]

## 2020-09-03 ENCOUNTER — Encounter: Payer: Self-pay | Admitting: Orthopedic Surgery

## 2020-09-03 ENCOUNTER — Ambulatory Visit (INDEPENDENT_AMBULATORY_CARE_PROVIDER_SITE_OTHER): Payer: Managed Care, Other (non HMO) | Admitting: Orthopedic Surgery

## 2020-09-03 ENCOUNTER — Other Ambulatory Visit: Payer: Self-pay

## 2020-09-03 VITALS — BP 157/101 | HR 95 | Ht 66.0 in | Wt 312.0 lb

## 2020-09-03 DIAGNOSIS — Z6841 Body Mass Index (BMI) 40.0 and over, adult: Secondary | ICD-10-CM

## 2020-09-03 DIAGNOSIS — M5441 Lumbago with sciatica, right side: Secondary | ICD-10-CM | POA: Diagnosis not present

## 2020-09-03 DIAGNOSIS — G8929 Other chronic pain: Secondary | ICD-10-CM

## 2020-09-03 DIAGNOSIS — M1710 Unilateral primary osteoarthritis, unspecified knee: Secondary | ICD-10-CM

## 2020-09-03 DIAGNOSIS — M1711 Unilateral primary osteoarthritis, right knee: Secondary | ICD-10-CM

## 2020-09-03 NOTE — Progress Notes (Addendum)
The patient meets the AMA guidelines for Morbid (severe) obesity with a BMI > 40.0 and I have recommended weight loss. Body mass index is 50.36 kg/m.  Encounter Diagnosis  Name Primary?  . Primary localized osteoarthritis of knee Yes    Chief Complaint  Patient presents with  . Knee Pain    Rt knee pain    59 year old male with back pain right leg radiculopathy and osteoarthritis right knee also has diabetes.  He got an injection in his right knee did well but his pain seemed to return.  He went to the ER for pain in his right leg running down to his foot and thought it might be related to his osteoarthritis in his right knee  He is already on meloxicam 15 mg and he takes hydrocodone for pain  He is asking about what should be done about his back as well  As far as his right knee he still has a BMI of 50.  He is on maximum dose of meloxicam.  He did well with 1 injection we will repeat it  Recommend he see primary care for MRI and referral to neurosurgery  Follow-up on a symptomatic basis regarding his right knee  X-ray from July 2021 shows 3 compartment arthritis but does not need any surgical intervention at this time  Procedure note right knee injection   verbal consent was obtained to inject right knee joint  Timeout was completed to confirm the site of injection  The medications used were 40 mg of Depo-Medrol and 1% lidocaine 3 cc  Anesthesia was provided by ethyl chloride and the skin was prepped with alcohol.  After cleaning the skin with alcohol a 20-gauge needle was used to inject the right knee joint. There were no complications. A sterile bandage was applied.

## 2020-09-03 NOTE — Patient Instructions (Signed)
You have received an injection of steroids into the joint. 15% of patients will have increased pain within the 24 hours postinjection.   This is transient and will go away.   We recommend that you use ice packs on the injection site for 20 minutes every 2 hours and extra strength Tylenol 2 tablets every 8 as needed until the pain resolves.  If you continue to have pain after taking the Tylenol and using the ice please call the office for further instructions.  Weight loss

## 2020-10-12 ENCOUNTER — Ambulatory Visit: Payer: Managed Care, Other (non HMO) | Admitting: Cardiology

## 2020-11-04 NOTE — Progress Notes (Signed)
Cardiology Office Note    Date:  11/06/2020   ID:  Roberto Guerra, DOB 12-Jan-1961, MRN [id number]  PCP:  Celene Squibb, MD  Cardiologist: Carlyle Dolly, MD    Chief Complaint  Patient presents with  . Follow-up    6 month visit    History of Present Illness:    Roberto Guerra is a 59 y.o. male with past medical history of chronic combined systolic and diastolic CHF (EF [id number] in 03/2019 with NST showing no reversible ischemia, EF normalized by repeat imaging in 07/2019), palpitations (PAC's and PVC's by prior monitor), HTN, HLD, and Type 2 DM who presents to the office today for 95-month follow-up.   He most recently had a telehealth visit with Dr. Harl Bowie in 03/2020 and denied any chest pain or dyspnea at that time. He reported mild palpitations but no progression of symptoms. He did report dizziness with positional changes and Lasix was decreased to 20mg  daily.   In talking with the patient today, he reports overall doing well since his last visit. Does not exercise regularly but says he stays active at work. Denies any recent chest pain or dyspnea on exertion. No recent orthopnea, PND or lower extremity edema. Dizziness did resolve with dose reduction of Lasix. He does experience occasional palpitations but says they have improved. He does consume 4-5 regular sodas a day and plans to try to decrease his intake in the new year to help with weight loss.    Past Medical History:  Diagnosis Date  . CHF (congestive heart failure) ([place])    a. EF 40-45% in 03/2019 with NST showing no reversible ischemia  . Diabetes mellitus without complication ([place])   . Gout   . Hyperlipidemia   . Hypertension   . PNA (pneumonia)     Past Surgical History:  Procedure Laterality Date  . CATARACT EXTRACTION W/PHACO Left 10/29/2018   Procedure: CATARACT EXTRACTION PHACO AND INTRAOCULAR LENS PLACEMENT (IOC);  Surgeon: Tonny Branch, MD;  Location: [facility];  Service: Ophthalmology;  Laterality: Left;   CDE: 43.37  . COLONOSCOPY N/A 02/27/2014   Procedure: COLONOSCOPY;  Surgeon: Rogene Houston, MD;  [facility];  Service: Endoscopy;  Laterality: N/A;  830  . NO PAST SURGERIES      Current Medications: Outpatient Medications Prior to Visit  Medication Sig Dispense Refill  . allopurinol (ZYLOPRIM) 300 MG tablet Take 300 mg by mouth daily.    . carvedilol (COREG) 12.5 MG tablet TAKE 1 TABLET BY MOUTH TWICE DAILY WITH A MEAL 180 tablet 1  . furosemide (LASIX) 20 MG tablet Take 1 tablet (20 mg total) by mouth daily. 90 tablet 3  . gabapentin (NEURONTIN) 300 MG capsule Take 300 mg by mouth 3 (three) times daily.     Marland Kitchen HYDROcodone-acetaminophen (NORCO/VICODIN) 5-325 MG tablet Take 1 tablet by mouth every 6 (six) hours as needed for severe pain.    . meclizine (ANTIVERT) 25 MG tablet Take 1 tablet (25 mg total) by mouth 3 (three) times daily as needed for dizziness. 30 tablet 0  . meloxicam (MOBIC) 15 MG tablet Take 15 mg by mouth daily.    . metFORMIN (GLUCOPHAGE) 1000 MG tablet Take 1,000 mg by mouth 2 (two) times daily.     . Omega-3 Fatty Acids (FISH OIL) 1000 MG CAPS Take 1,000 mg by mouth daily.    . potassium chloride (KLOR-CON) 10 MEQ tablet Take 1 tablet by mouth once daily 90 tablet 3  .  pravastatin (PRAVACHOL) 40 MG tablet Take 40 mg by mouth every morning.     Marland Kitchen lisinopril (ZESTRIL) 40 MG tablet Take 1 tablet (40 mg total) by mouth daily. 90 tablet 3  . nitroGLYCERIN (NITROSTAT) 0.4 MG SL tablet Place 1 tablet (0.4 mg total) under the tongue every 5 (five) minutes as needed for chest pain (CP or SOB). 30 tablet 3  . predniSONE (DELTASONE) 10 MG tablet Take 4 tablets (40 mg total) by mouth daily. (Patient not taking: Reported on 09/03/2020) 20 tablet 0   No facility-administered medications prior to visit.     Allergies:   Patient has no known allergies.   Social History   Socioeconomic History  . Marital status: Married    Spouse name: Not on file  . Number of  children: Not on file  . Years of education: 73  . Highest education level: Not on file  Occupational History    Employer: [employer]  Tobacco Use  . Smoking status: Former Smoker    Packs/day: 0.25    Years: 1.00    Pack years: 0.25    Types: Cigarettes    Quit date: 11/30/1998    Years since quitting: 21.9  . Smokeless tobacco: Never Used  Vaping Use  . Vaping Use: Never used  Substance and Sexual Activity  . Alcohol use: No  . Drug use: No  . Sexual activity: Not on file  Other Topics Concern  . Not on file  Social History Narrative   Lives with wife.  Daughter at home.  Supervisor.     Social Determinants of Health   Financial Resource Strain: Not on file  Food Insecurity: Not on file  Transportation Needs: Not on file  Physical Activity: Not on file  Stress: Not on file  Social Connections: Not on file     Family History:  The patient's family history includes Diabetes in his sister and another family member.   Review of Systems:   Please see the history of present illness.     General:  No chills, fever, night sweats or weight changes.  Cardiovascular:  No chest pain, dyspnea on exertion, edema, orthopnea, paroxysmal nocturnal dyspnea. Positive for palpitations.  Dermatological: No rash, lesions/masses Respiratory: No cough, dyspnea Urologic: No hematuria, dysuria Abdominal:   No nausea, vomiting, diarrhea, bright red blood per rectum, melena, or hematemesis Neurologic:  No visual changes, wkns, changes in mental status. All other systems reviewed and are otherwise negative except as noted above.   Physical Exam:    VS:  BP 136/82   Pulse (!) 104   Ht 5\' 6"  (1.676 m)   Wt (!) 313 lb (142 kg)   SpO2 95%   BMI 50.52 kg/m    General: Well developed, obese,male appearing in no acute distress. Head: Normocephalic, atraumatic. Neck: No carotid bruits. JVD not elevated.  Lungs: Respirations regular and unlabored, without wheezes or rales.  Heart:  Regular rate and rhythm. No S3 or S4.  No murmur, no rubs, or gallops appreciated. Abdomen: Appears non-distended. No obvious abdominal masses. Msk:  Strength and tone appear normal for age. No obvious joint deformities or effusions. Extremities: No clubbing or cyanosis. Trace ankle edema bilaterally.  Distal pedal pulses are 2+ bilaterally. Neuro: Alert and oriented X 3. Moves all extremities spontaneously. No focal deficits noted. Psych:  Responds to questions appropriately with a normal affect. Skin: No rashes or lesions noted  Wt Readings from Last 3 Encounters:  11/05/20 (!) 313 lb (142  kg)  09/03/20 (!) 312 lb (141.5 kg)  08/16/20 (!) 308 lb (139.7 kg)     Studies/Labs Reviewed:   EKG:  EKG is not ordered today.    Recent Labs: 03/13/2020: BUN 20; Creatinine, Ser 1.05; Hemoglobin 12.5; Platelets 150; Potassium 3.7; Sodium 140   Lipid Panel    Component Value Date/Time   CHOL 142 06/11/2019 0004   TRIG 126 06/11/2019 0004   HDL 37 (L) 06/11/2019 0004   CHOLHDL 3.8 06/11/2019 0004   VLDL 25 06/11/2019 0004   LDLCALC 80 06/11/2019 0004    Additional studies/ records that were reviewed today include:   Event Monitor: 05/2019  14 day monitor  Occasional PVCs, rare couplets. Isolated 4 beat run of NSVT  Occasioanl PACs, rare couplets and triplets  No symptoms reported   Limited Echo: 07/2019 IMPRESSIONS    1. The left ventricle has normal systolic function, with an ejection  fraction of 55-60%. Left ventricular diastolic Doppler parameters are  consistent with pseudonormal.  2. The right ventricle has normal systolc function. The cavity was  normal. There is no increase in right ventricular wall thickness.  3. The mitral valve is grossly normal.  4. The tricuspid valve was grossly normal.  5. The aortic valve is grossly normal.  6. The aortic root is normal in size and structure.  7. The interatrial septum was not well visualized.   Assessment:    1.  History of cardiomyopathy   2. Palpitations   3. Essential hypertension   4. Hyperlipidemia, unspecified hyperlipidemia type      Plan:   In order of problems listed above:  1. History of Cardiomyopathy - His EF was previously reduced at 40-45% in 03/2019 with NST showing no reversible ischemia and his EF normalized by repeat imaging in 07/2019. - He denies any recent orthopnea, PND or edema.  - Continue current medication regimen with Coreg 12.5mg  BID, Lasix 20mg  daily and Olmesartan 40mg  daily. Will request most recent labs from PCP.   2. Palpitations - His prior monitor showed PAC's and PVC's. He reports his symptoms have overall been stable. We did review the importance of reducing his caffeine intake.  - Continue Coreg 12.5mg  BID.   3. HTN  - BP is well-controlled at 136/82 during today's visit. Continue current medication regimen.   4. HLD - Followed by PCP. LDL was at 77 in 02/2020. Continue Pravastatin 40mg  daily.    Medication Adjustments/Labs and Tests Ordered: Current medicines are reviewed at length with the patient today.  Concerns regarding medicines are outlined above.  Medication changes, Labs and Tests ordered today are listed in the Patient Instructions below. Patient Instructions  Medication Instructions:  Your physician recommends that you continue on your current medications as directed. Please refer to the Current Medication list given to you today.  *If you need a refill on your cardiac medications before your next appointment, please call your pharmacy*   Lab Work: None today If you have labs (blood work) drawn today and your tests are completely normal, you will receive your results only by: MyChart Message (if you have MyChart) OR . A paper copy in the mail If you have any lab test that is abnormal or we need to change your treatment, we will call you to review the results.   Testing/Procedures: None today   Follow-Up: At [facility],  you and your health needs are our priority.  As part of our continuing mission to provide you with exceptional  heart care, we have created designated Provider Care Teams.  These Care Teams include your primary Cardiologist (physician) and Advanced Practice Providers (APPs -  Physician Assistants and Nurse Practitioners) who all work together to provide you with the care you need, when you need it.  We recommend signing up for the patient portal called "MyChart".  Sign up information is provided on this After Visit Summary.  MyChart is used to connect with patients for Virtual Visits (Telemedicine).  Patients are able to view lab/test results, encounter notes, upcoming appointments, etc.  Non-urgent messages can be sent to your provider as well.   To learn more about what you can do with MyChart, go to [email or web address].    Your next appointment:   6 month(s)  The format for your next appointment:   In Person  Provider:   Carlyle Dolly, MD   Other Instructions None      Thank you for choosing [facility] !            Signed, Erma Heritage, PA-C  11/06/2020 8:11 AM    [facility] [address] [place], [place] [address] Phone: [phone] Fax: [phone]

## 2020-11-05 ENCOUNTER — Other Ambulatory Visit: Payer: Self-pay

## 2020-11-05 ENCOUNTER — Encounter: Payer: Self-pay | Admitting: Student

## 2020-11-05 ENCOUNTER — Ambulatory Visit (INDEPENDENT_AMBULATORY_CARE_PROVIDER_SITE_OTHER): Payer: Managed Care, Other (non HMO) | Admitting: Student

## 2020-11-05 VITALS — BP 136/82 | HR 104 | Ht 66.0 in | Wt 313.0 lb

## 2020-11-05 DIAGNOSIS — Z8679 Personal history of other diseases of the circulatory system: Secondary | ICD-10-CM

## 2020-11-05 DIAGNOSIS — I10 Essential (primary) hypertension: Secondary | ICD-10-CM

## 2020-11-05 DIAGNOSIS — E785 Hyperlipidemia, unspecified: Secondary | ICD-10-CM | POA: Diagnosis not present

## 2020-11-05 DIAGNOSIS — R002 Palpitations: Secondary | ICD-10-CM

## 2020-11-05 MED ORDER — NITROGLYCERIN 0.4 MG SL SUBL: 0.4000 mg | SUBLINGUAL_TABLET | SUBLINGUAL | 3 refills | Status: DC | PRN

## 2020-11-05 MED ORDER — OLMESARTAN MEDOXOMIL 40 MG PO TABS: 40.0000 mg | ORAL_TABLET | Freq: Every day | ORAL | Status: AC

## 2020-11-05 NOTE — Patient Instructions (Signed)
Medication Instructions:  Your physician recommends that you continue on your current medications as directed. Please refer to the Current Medication list given to you today.  *If you need a refill on your cardiac medications before your next appointment, please call your pharmacy*   Lab Work: None today If you have labs (blood work) drawn today and your tests are completely normal, you will receive your results only by: . MyChart Message (if you have MyChart) OR . A paper copy in the mail If you have any lab test that is abnormal or we need to change your treatment, we will call you to review the results.   Testing/Procedures: None today   Follow-Up: At [facility], you and your health needs are our priority.  As part of our continuing mission to provide you with exceptional heart care, we have created designated Provider Care Teams.  These Care Teams include your primary Cardiologist (physician) and Advanced Practice Providers (APPs -  Physician Assistants and Nurse Practitioners) who all work together to provide you with the care you need, when you need it.  We recommend signing up for the patient portal called "MyChart".  Sign up information is provided on this After Visit Summary.  MyChart is used to connect with patients for Virtual Visits (Telemedicine).  Patients are able to view lab/test results, encounter notes, upcoming appointments, etc.  Non-urgent messages can be sent to your provider as well.   To learn more about what you can do with MyChart, go to [email or web address].    Your next appointment:   6 month(s)  The format for your next appointment:   In Person  Provider:   Jonathan Branch, MD   Other Instructions None       Thank you for choosing [facility] !         

## 2020-11-06 ENCOUNTER — Encounter: Payer: Self-pay | Admitting: Student

## 2020-12-10 IMAGING — DX CHEST - 2 VIEW
2 series · 2 of 2 positions shown · non-contrast
Comparison: March 30, 2019

CLINICAL DATA: Shortness of breath

EXAM:
CHEST - 2 VIEW

[chest pa]
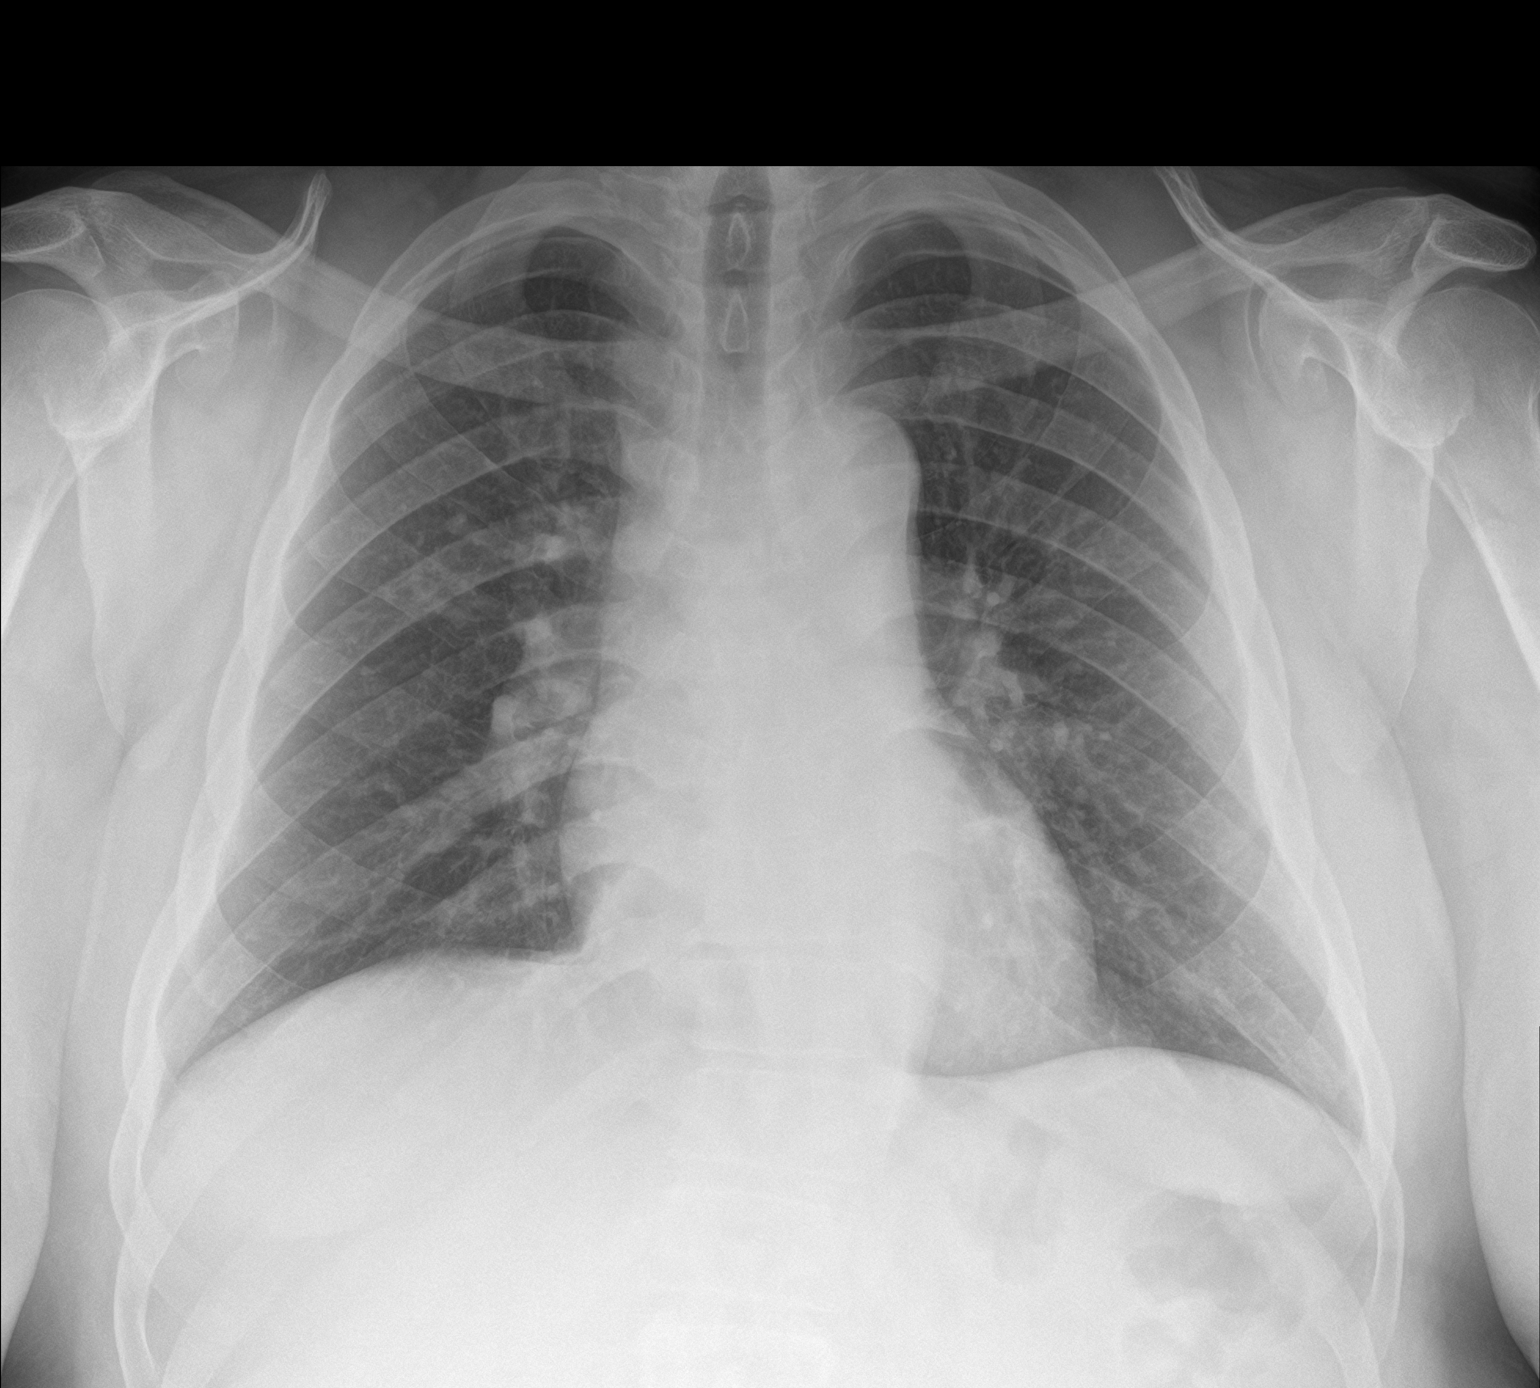

[chest lat]
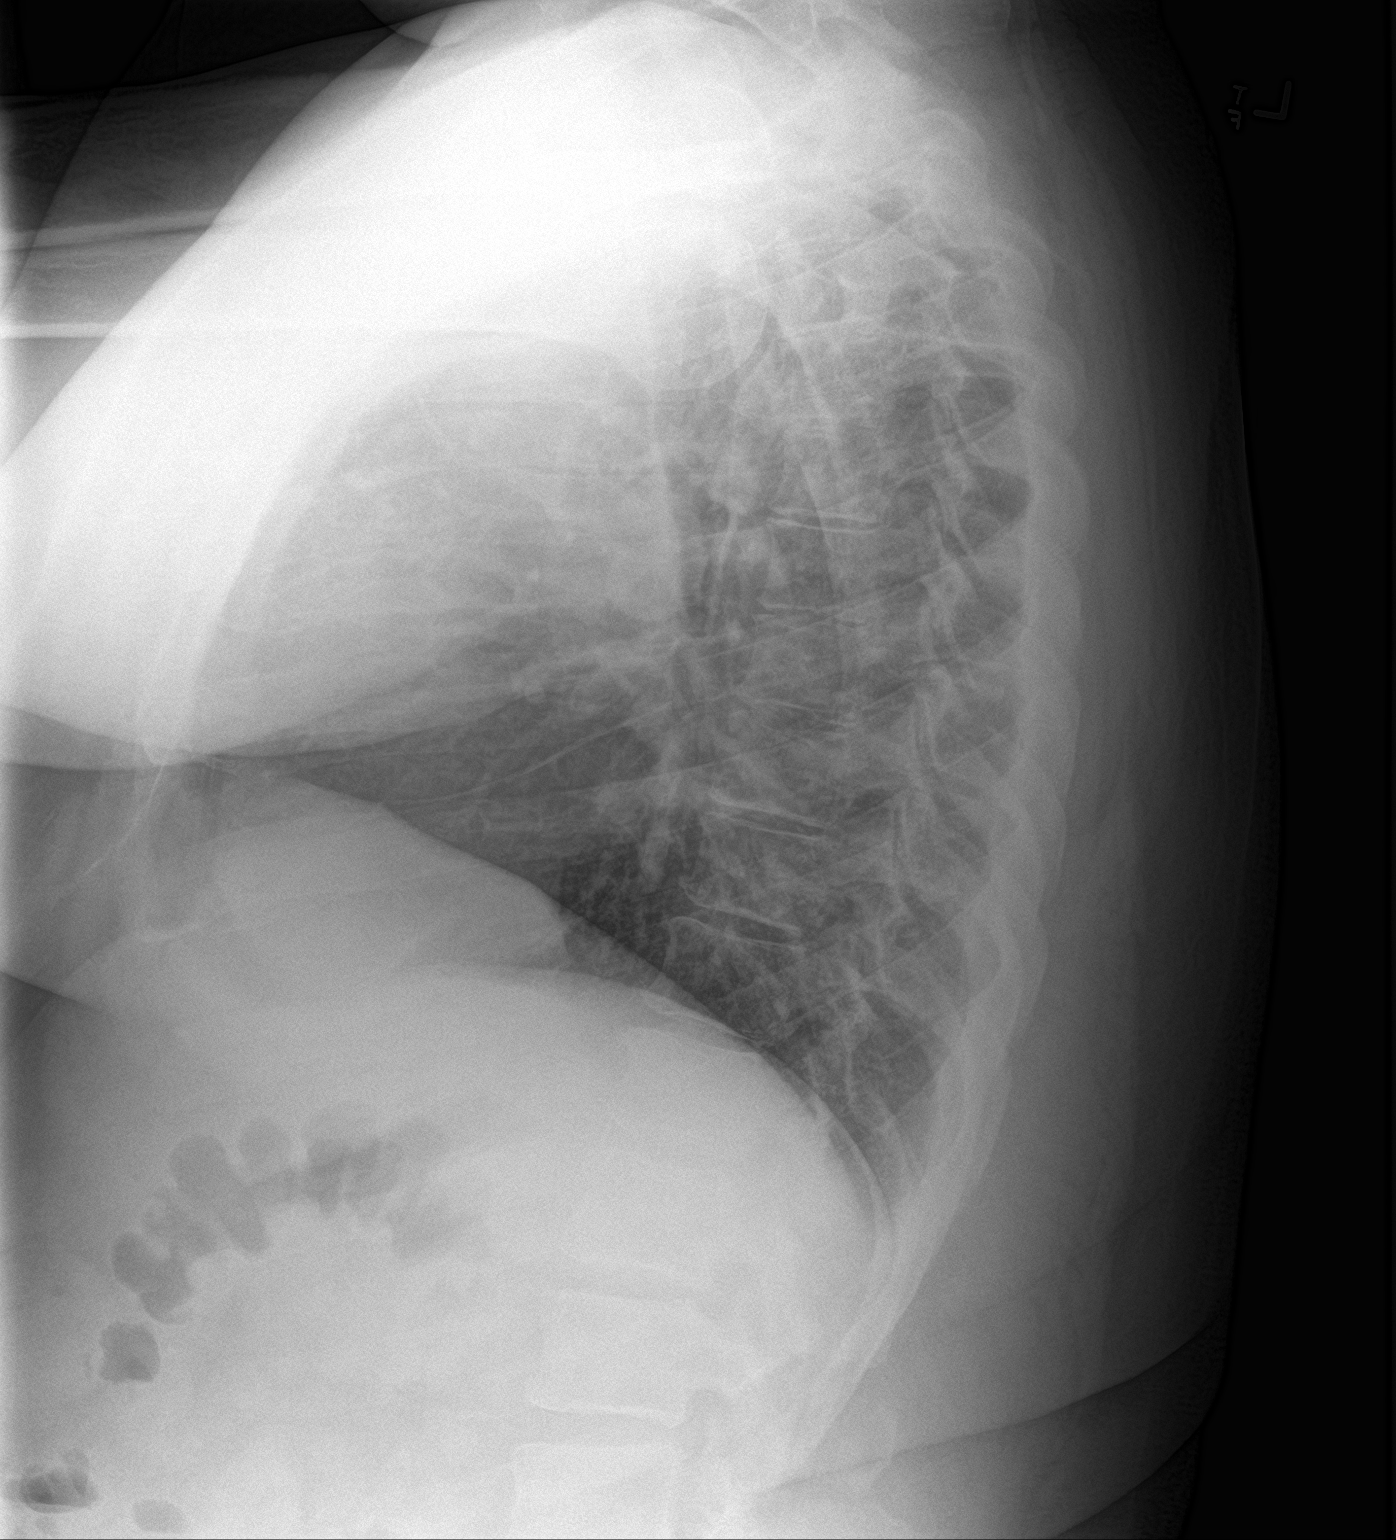

[2 of 2 positions shown; findings below may reference images not displayed]

FINDINGS: Lungs are clear. Heart size and pulmonary vascularity are normal. No
adenopathy. There is lower thoracic levoscoliosis.
IMPRESSION: No edema or consolidation.

## 2020-12-11 ENCOUNTER — Other Ambulatory Visit: Payer: Self-pay | Admitting: Cardiology

## 2020-12-14 ENCOUNTER — Encounter: Payer: Self-pay | Admitting: Orthopedic Surgery

## 2020-12-14 ENCOUNTER — Ambulatory Visit (INDEPENDENT_AMBULATORY_CARE_PROVIDER_SITE_OTHER): Payer: Managed Care, Other (non HMO) | Admitting: Orthopedic Surgery

## 2020-12-14 ENCOUNTER — Other Ambulatory Visit: Payer: Self-pay

## 2020-12-14 VITALS — BP 191/111 | HR 89 | Ht 66.0 in | Wt 311.0 lb

## 2020-12-14 DIAGNOSIS — M1711 Unilateral primary osteoarthritis, right knee: Secondary | ICD-10-CM

## 2020-12-14 DIAGNOSIS — Z6841 Body Mass Index (BMI) 40.0 and over, adult: Secondary | ICD-10-CM

## 2020-12-14 DIAGNOSIS — M1710 Unilateral primary osteoarthritis, unspecified knee: Secondary | ICD-10-CM

## 2020-12-14 NOTE — Progress Notes (Signed)
BP (!) 191/111   Pulse 89   Ht 5\' 6"  (1.676 m)   Wt (!) 311 lb (141.1 kg)   BMI 50.20 kg/m   Chief Complaint  Patient presents with  . Knee Pain    R/doing ok but still having pain.   60 year old male with significant obesity chronic pain right knee from arthritis  Currently on hydrocodone and Mobic  Improved but still having pain especially at night occasionally during the day right knee  Physical Exam Musculoskeletal:     Right knee: Effusion, bony tenderness and crepitus present. No swelling. Decreased range of motion. No ACL laxity or PCL laxity.    Encounter Diagnoses  Name Primary?  . Primary localized osteoarthritis of knee Yes  . Body mass index 45.0-49.9, adult ([place])   . Morbid obesity ([place])      Procedure note right knee injection   verbal consent was obtained to inject right knee joint  Timeout was completed to confirm the site of injection  The medications used were 40 mg of Depo-Medrol and 1% lidocaine 3 cc  Anesthesia was provided by ethyl chloride and the skin was prepped with alcohol.  After cleaning the skin with alcohol a 20-gauge needle was used to inject the right knee joint. There were no complications. A sterile bandage was applied.  Follow-up plus x-ray in a year continue current medications

## 2020-12-14 NOTE — Patient Instructions (Signed)
Try to lose weight   Take norco and mobic   You have received an injection of steroids into the joint. 15% of patients will have increased pain within the 24 hours postinjection.   This is transient and will go away.   We recommend that you use ice packs on the injection site for 20 minutes every 2 hours and extra strength Tylenol 2 tablets every 8 as needed until the pain resolves.  If you continue to have pain after taking the Tylenol and using the ice please call the office for further instructions.

## 2021-03-25 ENCOUNTER — Other Ambulatory Visit: Payer: Self-pay | Admitting: Cardiology

## 2021-04-07 ENCOUNTER — Other Ambulatory Visit: Payer: Self-pay | Admitting: Cardiology

## 2021-06-14 ENCOUNTER — Other Ambulatory Visit: Payer: Self-pay

## 2021-06-14 ENCOUNTER — Emergency Department (HOSPITAL_COMMUNITY)
Admission: EM | Admit: 2021-06-14 | Discharge: 2021-06-14 | Disposition: A | Discharge status: Home / Self Care | Payer: 59 | Attending: Emergency Medicine | Admitting: Emergency Medicine

## 2021-06-14 ENCOUNTER — Emergency Department (HOSPITAL_COMMUNITY): Payer: 59

## 2021-06-14 DIAGNOSIS — I509 Heart failure, unspecified: Secondary | ICD-10-CM | POA: Insufficient documentation

## 2021-06-14 DIAGNOSIS — Z87891 Personal history of nicotine dependence: Secondary | ICD-10-CM | POA: Insufficient documentation

## 2021-06-14 DIAGNOSIS — E119 Type 2 diabetes mellitus without complications: Secondary | ICD-10-CM | POA: Insufficient documentation

## 2021-06-14 DIAGNOSIS — M25561 Pain in right knee: Secondary | ICD-10-CM | POA: Insufficient documentation

## 2021-06-14 DIAGNOSIS — I110 Hypertensive heart disease with heart failure: Secondary | ICD-10-CM | POA: Diagnosis not present

## 2021-06-14 DIAGNOSIS — Z79899 Other long term (current) drug therapy: Secondary | ICD-10-CM | POA: Insufficient documentation

## 2021-06-14 NOTE — ED Provider Notes (Signed)
[facility] EMERGENCY DEPARTMENT Provider Note   CSN: [id number] Arrival date & time: 06/14/21  0601     History Chief Complaint  Patient presents with   Knee Pain    Roberto Guerra is a 60 y.o. male.  Patient presents with a chief complaint of right knee pain.  Describes it as sharp pain in the lateral aspect of the right knee.  Is been ongoing for the past week.  Worse when he puts his weight on his knee a certain way.  Denies fall or other trauma.  Denies fevers or cough or vomiting or diarrhea.      Past Medical History:  Diagnosis Date   CHF (congestive heart failure) ([place])    a. EF 40-45% in 03/2019 with NST showing no reversible ischemia   Diabetes mellitus without complication (HCC)    Gout    Hyperlipidemia    Hypertension    PNA (pneumonia)     Patient Active Problem List   Diagnosis Date Noted   Atypical chest pain 06/10/2019   Dyspnea 03/30/2019   Tachycardia 03/30/2019   Neuralgia of lower extremity 08/15/2012   ERECTILE DYSFUNCTION 08/29/2008   CERUMEN IMPACTION, BILATERAL 06/27/2008   GOUT 09/21/2007   Hyperlipidemia 10/12/2006   OBESITY NOS 10/12/2006   Essential hypertension 10/12/2006   GERD 10/12/2006   HIATAL HERNIA 10/12/2006    Past Surgical History:  Procedure Laterality Date   CATARACT EXTRACTION W/PHACO Left 10/29/2018   Procedure: CATARACT EXTRACTION PHACO AND INTRAOCULAR LENS PLACEMENT ([place]);  Surgeon: Tonny Branch, MD;  Location: [facility] ORS;  Service: Ophthalmology;  Laterality: Left;  CDE: 43.37   COLONOSCOPY N/A 02/27/2014   Procedure: COLONOSCOPY;  Surgeon: Rogene Houston, MD;  Location: [facility] ENDO SUITE;  Service: Endoscopy;  Laterality: N/A;  60   NO PAST SURGERIES         Family History  Problem Relation Age of Onset   Diabetes Other    Diabetes Sister    Colon cancer Neg Hx     Social History   Tobacco Use   Smoking status: Former    Packs/day: 0.25    Years: 1.00    Pack years: 0.25    Types: Cigarettes    Quit  date: 11/30/1998    Years since quitting: 22.5   Smokeless tobacco: Never  Vaping Use   Vaping Use: Never used  Substance Use Topics   Alcohol use: No   Drug use: No    Home Medications Prior to Admission medications   Medication Sig Start Date End Date Taking? Authorizing Provider  allopurinol (ZYLOPRIM) 300 MG tablet Take 300 mg by mouth daily.    [provider]  carvedilol (COREG) 12.5 MG tablet TAKE 1 TABLET BY MOUTH TWICE DAILY WITH A MEAL 03/26/21   Arnoldo Lenis, MD  furosemide (LASIX) 20 MG tablet Take 1 tablet by mouth once daily 04/08/21   Arnoldo Lenis, MD  gabapentin (NEURONTIN) 300 MG capsule Take 300 mg by mouth 3 (three) times daily.     [provider]  HYDROcodone-acetaminophen (NORCO/VICODIN) 5-325 MG tablet Take 1 tablet by mouth every 6 (six) hours as needed for severe pain.    [provider]  meclizine (ANTIVERT) 25 MG tablet Take 1 tablet (25 mg total) by mouth 3 (three) times daily as needed for dizziness. 03/13/20   Orpah Greek, MD  meloxicam (MOBIC) 15 MG tablet Take 15 mg by mouth daily.    [provider]  metFORMIN (GLUCOPHAGE)  1000 MG tablet Take 1,000 mg by mouth 2 (two) times daily.     [provider]  nitroGLYCERIN (NITROSTAT) 0.4 MG SL tablet Place 1 tablet (0.4 mg total) under the tongue every 5 (five) minutes as needed for chest pain (CP or SOB). 11/05/20 12/05/20  Strader, Fransisco Hertz, PA-C  olmesartan (BENICAR) 40 MG tablet Take 1 tablet (40 mg total) by mouth daily. 11/05/20   Strader, Fransisco Hertz, PA-C  Omega-3 Fatty Acids (FISH OIL) 1000 MG CAPS Take 1,000 mg by mouth daily.    [provider]  potassium chloride (KLOR-CON) 10 MEQ tablet Take 1 tablet by mouth once daily 05/06/20   Arnoldo Lenis, MD  pravastatin (PRAVACHOL) 40 MG tablet Take 40 mg by mouth every morning.     [provider]    Allergies    Patient has no known allergies.  Review of Systems   Review  of Systems  Constitutional:  Negative for fever.  HENT:  Negative for ear pain and sore throat.   Eyes:  Negative for pain.  Respiratory:  Negative for cough.   Cardiovascular:  Negative for chest pain.  Gastrointestinal:  Negative for abdominal pain.  Genitourinary:  Negative for flank pain.  Musculoskeletal:  Negative for back pain.  Skin:  Negative for color change and rash.  Neurological:  Negative for syncope.  All other systems reviewed and are negative.  Physical Exam Updated Vital Signs BP (!) 155/95 (BP Location: Right Arm)   Pulse 66   Temp 98.9 F (37.2 C) (Oral)   Resp 20   Ht '5\' 6"'$  (1.676 m)   Wt (!) 139.7 kg   SpO2 100%   BMI 49.71 kg/m   Physical Exam Constitutional:      Appearance: He is well-developed.  HENT:     Head: Normocephalic.     Nose: Nose normal.  Eyes:     Extraocular Movements: Extraocular movements intact.  Cardiovascular:     Rate and Rhythm: Normal rate.  Pulmonary:     Effort: Pulmonary effort is normal.  Musculoskeletal:     Comments: On visual inspection no obvious deformity or color change on the right lower extremity.  On palpation no significant tenderness of the knee.  Normal range of motion present on exam without pain or discomfort.  Patient able to stand and weight-bear but states that he feels a sharp twinge of pain when he does so.  Neurologically tact distally and compartments are otherwise soft.  Skin:    Coloration: Skin is not jaundiced.  Neurological:     Mental Status: He is alert. Mental status is at baseline.    ED Results / Procedures / Treatments   Labs (all labs ordered are listed, but only abnormal results are displayed) Labs Reviewed - No data to display  EKG None  Radiology DG Knee Complete 4 Views Right  Result Date: 06/14/2021 CLINICAL DATA:  60 year old male with lateral right knee pain for 1 week. No known injury. EXAM: RIGHT KNEE - COMPLETE 4+ VIEW COMPARISON:  05/31/2020. FINDINGS: No joint  effusion. Tricompartmental degeneration with medial and patellofemoral dominant joint space loss. Moderate tricompartmental degenerative spurring. Patella appears stable and intact. No acute osseous abnormality identified. IMPRESSION: Advanced tricompartmental degeneration. No acute osseous abnormality identified. Electronically Signed   By: Genevie Ann M.D.   On: 06/14/2021 06:49    Procedures Procedures   Medications Ordered in ED Medications - No data to display  ED Course  I have reviewed the  triage vital signs and the nursing notes.  Pertinent labs & imaging results that were available during my care of the patient were reviewed by me and considered in my medical decision making (see chart for details).    MDM Rules/Calculators/A&P                           X-rays show extensive tricompartmental degeneration of the right knee.  I suspect this is the cause of his pain.  Ace wrap applied to the right knee for support, advised outpatient follow-up with his primary care doctor within the week.  Recommending immediate return for worsening pain fevers or additional concerns.  Final Clinical Impression(s) / ED Diagnoses Final diagnoses:  Acute pain of right knee    Rx / DC Orders ED Discharge Orders     None        Luna Fuse, MD 06/14/21 [phone]

## 2021-06-14 NOTE — Discharge Instructions (Addendum)
Call your primary care doctor or specialist as discussed in the next 2-3 days.   Return immediately back to the ER if:  Your symptoms worsen within the next 12-24 hours. You develop new symptoms such as new fevers, persistent vomiting, new pain, shortness of breath, or new weakness or numbness, or if you have any other concerns.  

## 2021-06-14 NOTE — ED Triage Notes (Signed)
Right knee and lower leg pain x 1 week, denies swelling or redness or heat. Pt has taken hydrocodone for pain. Pt reports pain is worse this a.m. Last hydrocodone 06/13/2021 1600.  No hx of similar episodes in past.

## 2021-09-13 DIAGNOSIS — G894 Chronic pain syndrome: Secondary | ICD-10-CM | POA: Diagnosis not present

## 2021-09-13 DIAGNOSIS — I10 Essential (primary) hypertension: Secondary | ICD-10-CM | POA: Diagnosis not present

## 2021-11-23 ENCOUNTER — Other Ambulatory Visit: Payer: Self-pay | Admitting: Cardiology

## 2021-12-16 ENCOUNTER — Encounter: Payer: Self-pay | Admitting: Orthopedic Surgery

## 2021-12-16 ENCOUNTER — Ambulatory Visit (INDEPENDENT_AMBULATORY_CARE_PROVIDER_SITE_OTHER): Payer: 59 | Admitting: Orthopedic Surgery

## 2021-12-16 ENCOUNTER — Other Ambulatory Visit: Payer: Self-pay

## 2021-12-16 ENCOUNTER — Ambulatory Visit: Payer: 59

## 2021-12-16 VITALS — Ht 66.0 in | Wt 305.0 lb

## 2021-12-16 DIAGNOSIS — Z6841 Body Mass Index (BMI) 40.0 and over, adult: Secondary | ICD-10-CM | POA: Diagnosis not present

## 2021-12-16 DIAGNOSIS — M1711 Unilateral primary osteoarthritis, right knee: Secondary | ICD-10-CM

## 2021-12-16 DIAGNOSIS — M1710 Unilateral primary osteoarthritis, unspecified knee: Secondary | ICD-10-CM

## 2021-12-16 NOTE — Progress Notes (Signed)
Chief Complaint  Patient presents with   Knee Pain    right    Mr. Gladden is 61 years old his BMI is 42 he is being followed for osteoarthritis he received an injection December 14, 2020  At that time he was on hydrocodone and Mobic  X-rays shows mild progression of his medial compartment arthrosis  The patient's blood pressure was elevated so he was taken off Mobic he is now on hydrocodone and Tylenol arthritis  His x-ray shows mild progression of his medial compartment wear with mild varus deformity   Encounter Diagnoses  Name Primary?   Primary localized osteoarthritis of knee Yes   Body mass index 45.0-49.9, adult (HCC)    Morbid obesity ([place])    Recommended patient lose about 70 pounds  Follow-up x-rays in 1 year  Procedure note right knee injection  Procedure note right knee injection   verbal consent was obtained to inject right knee joint  Timeout was completed to confirm the site of injection  The medications used were depomedrol 40 mg and 1% lidocaine 3 cc Anesthesia was provided by ethyl chloride and the skin was prepped with alcohol.  After cleaning the skin with alcohol a 20-gauge needle was used to inject the right knee joint. There were no complications. A sterile bandage was applied.

## 2021-12-20 ENCOUNTER — Ambulatory Visit: Payer: Managed Care, Other (non HMO) | Admitting: Orthopedic Surgery

## 2021-12-27 ENCOUNTER — Other Ambulatory Visit: Payer: Self-pay | Admitting: Cardiology

## 2022-01-12 IMAGING — DX DG HIP (WITH OR WITHOUT PELVIS) 2-3V*R*
3 series · 3 of 3 positions shown · non-contrast
Comparison: No priors.

CLINICAL DATA: 58-year-old male with history of right-sided hip
pain.

EXAM:
DG HIP (WITH OR WITHOUT PELVIS) 2-3V RIGHT

[pelvis ap]
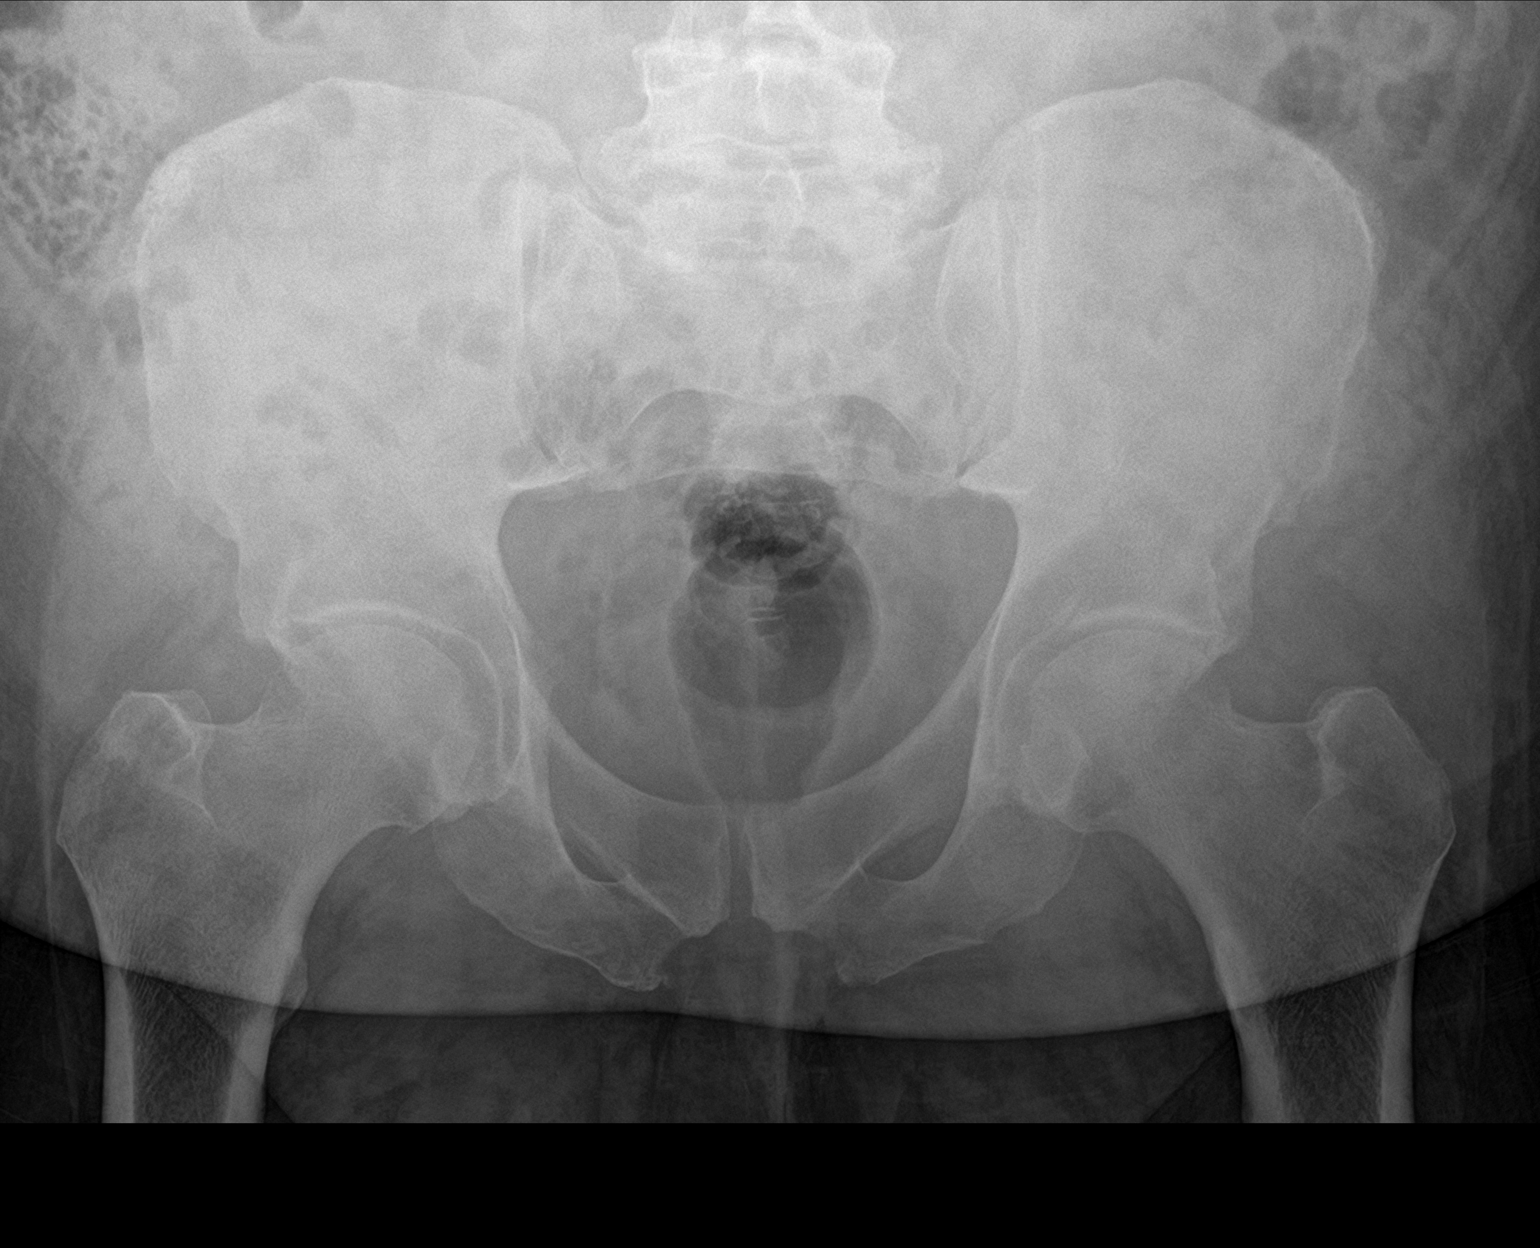

[hip ap]
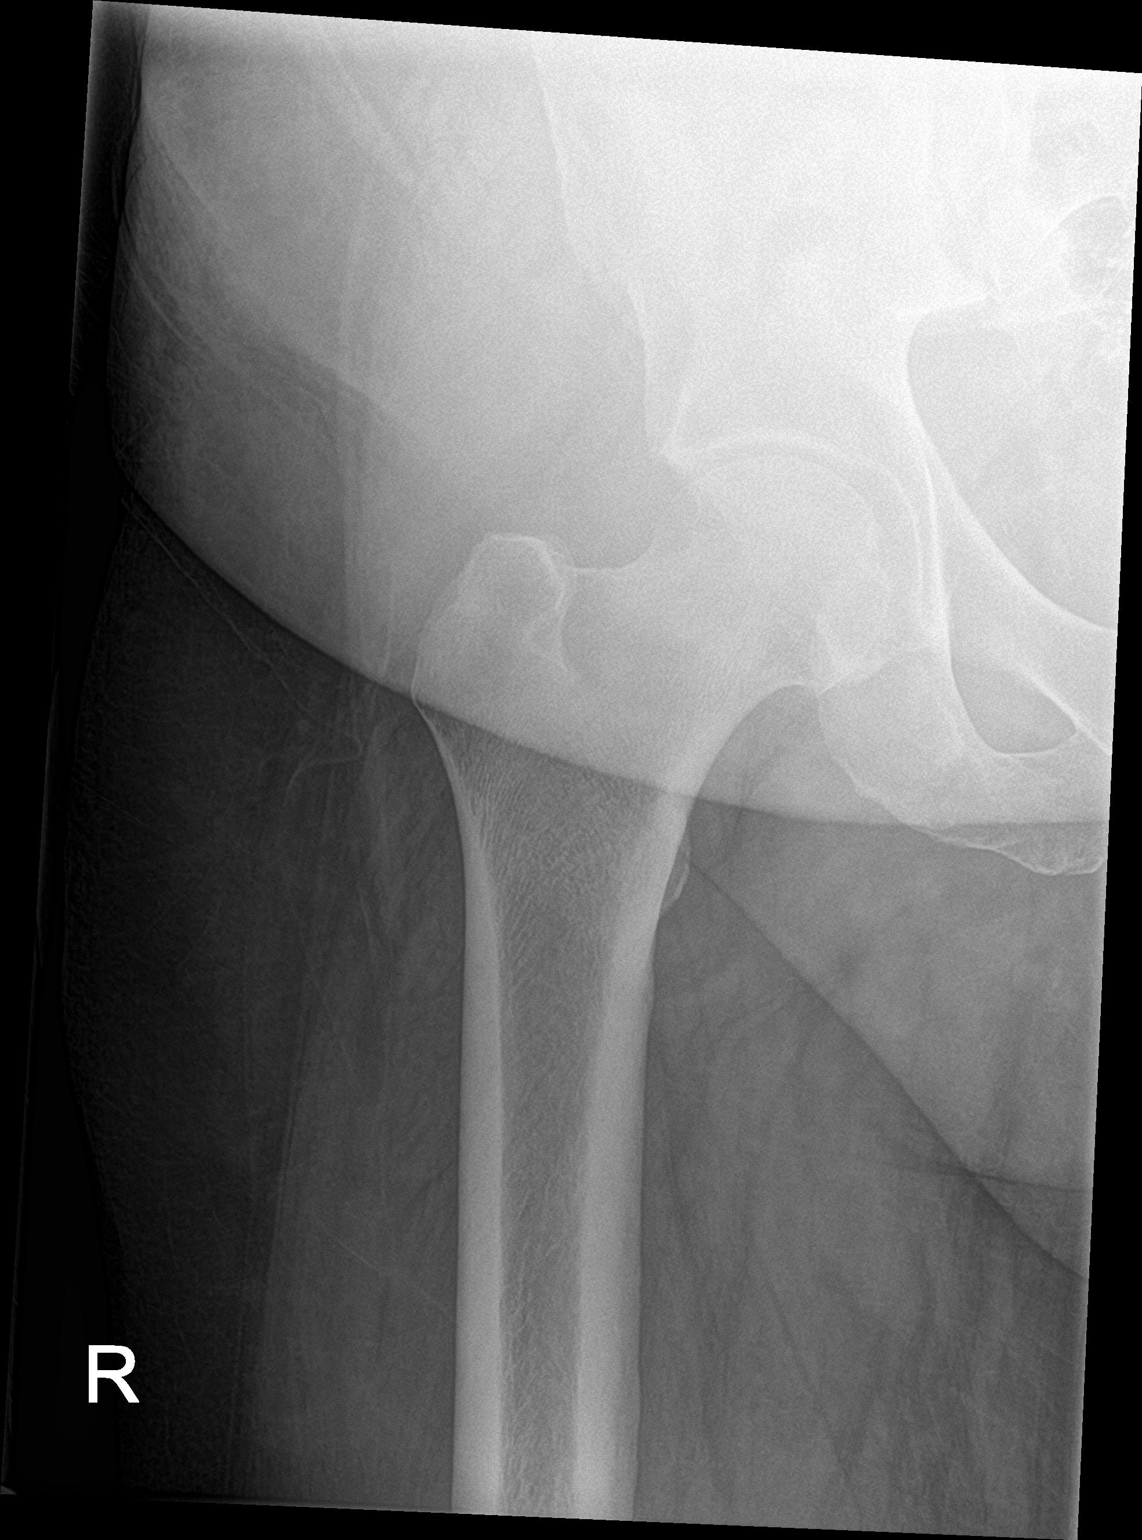

[hip frog leg]
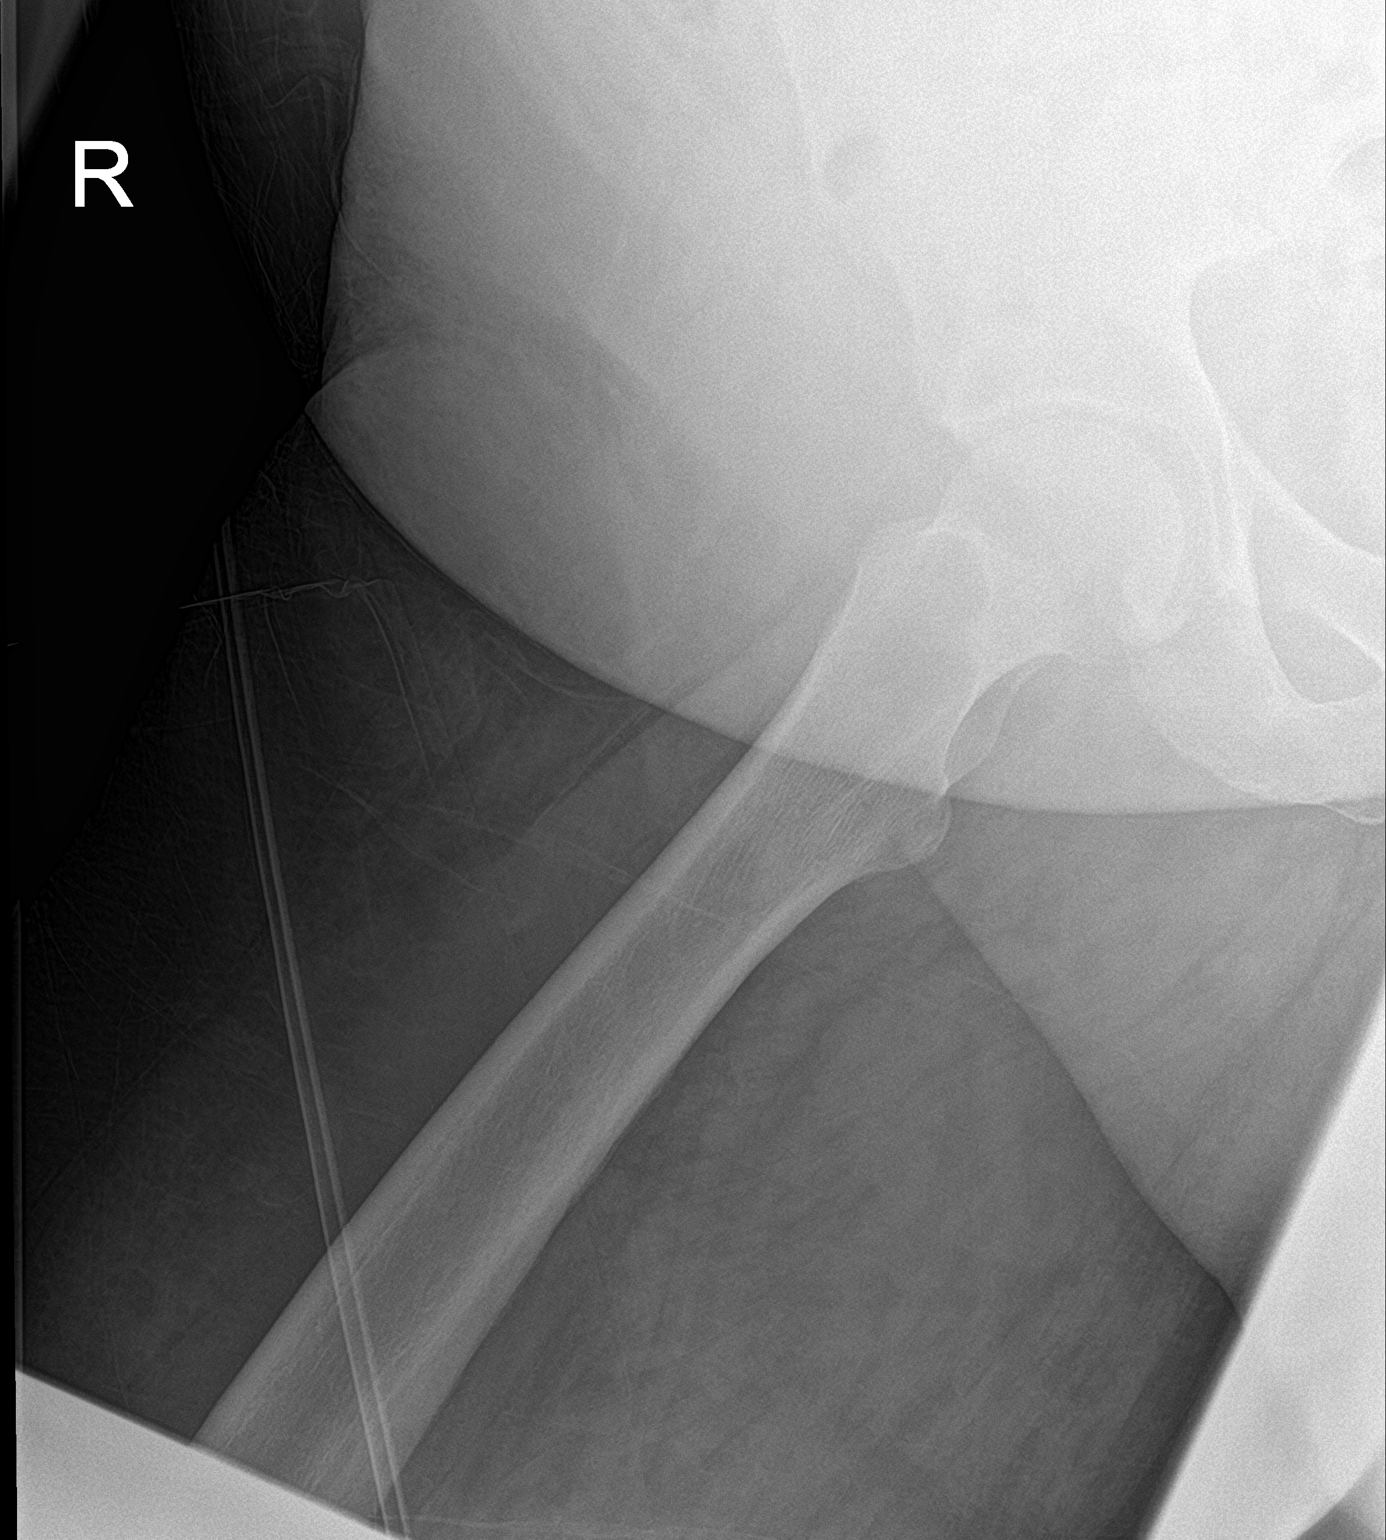

[3 of 3 positions shown; findings below may reference images not displayed]

FINDINGS: AP view of the bony pelvis and AP and lateral views of the right hip
demonstrate no acute displaced fracture, subluxation or dislocation.
The right hip is located. Joint space narrowing, subchondral
sclerosis and osteophyte formation is noted in the hip joints
bilaterally, compatible with moderate osteoarthritis.
IMPRESSION: 1. No acute radiographic abnormality of the bony pelvis or the right
hip.
2. Moderate bilateral hip joint osteoarthritis.

## 2022-01-24 ENCOUNTER — Other Ambulatory Visit: Payer: Self-pay | Admitting: Cardiology

## 2022-02-13 ENCOUNTER — Other Ambulatory Visit: Payer: Self-pay | Admitting: Cardiology

## 2022-02-15 ENCOUNTER — Other Ambulatory Visit: Payer: Self-pay

## 2022-02-15 ENCOUNTER — Encounter (HOSPITAL_COMMUNITY): Payer: Self-pay

## 2022-02-15 ENCOUNTER — Emergency Department (HOSPITAL_COMMUNITY): Payer: 59

## 2022-02-15 ENCOUNTER — Emergency Department (HOSPITAL_COMMUNITY)
Admission: EM | Admit: 2022-02-15 | Discharge: 2022-02-15 | Disposition: A | Discharge status: Home / Self Care | Payer: 59 | Attending: Emergency Medicine | Admitting: Emergency Medicine

## 2022-02-15 DIAGNOSIS — J069 Acute upper respiratory infection, unspecified: Secondary | ICD-10-CM | POA: Diagnosis not present

## 2022-02-15 DIAGNOSIS — I509 Heart failure, unspecified: Secondary | ICD-10-CM | POA: Diagnosis not present

## 2022-02-15 DIAGNOSIS — Z20822 Contact with and (suspected) exposure to covid-19: Secondary | ICD-10-CM | POA: Insufficient documentation

## 2022-02-15 DIAGNOSIS — Z7984 Long term (current) use of oral hypoglycemic drugs: Secondary | ICD-10-CM | POA: Insufficient documentation

## 2022-02-15 DIAGNOSIS — I110 Hypertensive heart disease with heart failure: Secondary | ICD-10-CM | POA: Insufficient documentation

## 2022-02-15 DIAGNOSIS — E119 Type 2 diabetes mellitus without complications: Secondary | ICD-10-CM | POA: Insufficient documentation

## 2022-02-15 DIAGNOSIS — R059 Cough, unspecified: Secondary | ICD-10-CM | POA: Diagnosis present

## 2022-02-15 DIAGNOSIS — Z79899 Other long term (current) drug therapy: Secondary | ICD-10-CM | POA: Diagnosis not present

## 2022-02-15 LAB — RESP PANEL BY RT-PCR (FLU A&B, COVID) ARPGX2
Influenza A by PCR: NEGATIVE
Influenza B by PCR: NEGATIVE
SARS Coronavirus 2 by RT PCR: NEGATIVE

## 2022-02-15 NOTE — ED Triage Notes (Signed)
Pt c/o cough and congestion for 1 week.  ?

## 2022-02-15 NOTE — ED Provider Notes (Signed)
?[place] ?Provider Note ? ? ?CSN: [id number] ?Arrival date & time: 02/15/22  0437 ? ?  ? ?History ? ?Chief Complaint  ?Patient presents with  ? Cough  ? ? ?Roberto Guerra is a 61 y.o. male. ? ?HPI ? ?  ? ?This is a 61 year old male who presents with cough and congestion.  Patient reports onset of symptoms on Saturday.  Has been using Alka-Seltzer cough medication with minimal relief.  Cough is nonproductive.  Denies fevers or chills.  No known sick contacts or COVID exposures.  Denies fevers or chills.  He is a non-smoker.  No history of asthma or COPD. ? ?Home Medications ?Prior to Admission medications   ?Medication Sig Start Date End Date Taking? Authorizing Provider  ?furosemide (LASIX) 20 MG tablet Take 1 tablet by mouth once daily 01/25/22   Arnoldo Lenis, MD  ?allopurinol (ZYLOPRIM) 300 MG tablet Take 300 mg by mouth daily.    [provider]  ?carvedilol (COREG) 12.5 MG tablet TAKE 1 TABLET BY MOUTH TWICE DAILY WITH A MEAL 03/26/21   Arnoldo Lenis, MD  ?gabapentin (NEURONTIN) 300 MG capsule Take 300 mg by mouth 3 (three) times daily.     [provider]  ?HYDROcodone-acetaminophen (NORCO/VICODIN) 5-325 MG tablet Take 1 tablet by mouth every 6 (six) hours as needed for severe pain.    [provider]  ?meclizine (ANTIVERT) 25 MG tablet Take 1 tablet (25 mg total) by mouth 3 (three) times daily as needed for dizziness. 03/13/20   Orpah Greek, MD  ?metFORMIN (GLUCOPHAGE) 1000 MG tablet Take 1,000 mg by mouth 2 (two) times daily.     [provider]  ?nitroGLYCERIN (NITROSTAT) 0.4 MG SL tablet Place 1 tablet (0.4 mg total) under the tongue every 5 (five) minutes as needed for chest pain (CP or SOB). 11/05/20 12/05/20  Strader, Fransisco Hertz, PA-C  ?olmesartan (BENICAR) 40 MG tablet Take 1 tablet (40 mg total) by mouth daily. 11/05/20   Erma Heritage, PA-C  ?Omega-3 Fatty Acids (FISH OIL) 1000 MG CAPS Take 1,000 mg by mouth daily.     [provider]  ?potassium chloride (KLOR-CON) 10 MEQ tablet Take 1 tablet by mouth once daily 05/06/20   Arnoldo Lenis, MD  ?pravastatin (PRAVACHOL) 40 MG tablet Take 40 mg by mouth every morning.     [provider]  ?   ? ?Allergies    ?Patient has no known allergies.   ? ?Review of Systems   ?Review of Systems  ?Constitutional:  Negative for chills and fever.  ?HENT:  Positive for congestion.   ?Respiratory:  Positive for cough. Negative for shortness of breath.   ?Cardiovascular:  Negative for chest pain.  ?Gastrointestinal:  Negative for diarrhea, nausea and vomiting.  ?All other systems reviewed and are negative. ? ?Physical Exam ?Updated Vital Signs ?BP (!) 147/93   Pulse 97   Temp 98.2 ?F (36.8 ?C) (Oral)   Resp 20   Ht 1.626 m ('5\' 4"'$ )   Wt 133.8 kg   SpO2 96%   BMI 50.64 kg/m?  ?Physical Exam ?Vitals and nursing note reviewed.  ?Constitutional:   ?   Appearance: He is well-developed. He is obese. He is not ill-appearing.  ?HENT:  ?   Head: Normocephalic and atraumatic.  ?   Nose: Congestion present.  ?   Mouth/Throat:  ?   Mouth: Mucous membranes are moist.  ?Eyes:  ?   Pupils: Pupils are equal, round,  and reactive to light.  ?Cardiovascular:  ?   Rate and Rhythm: Normal rate and regular rhythm.  ?   Heart sounds: Normal heart sounds. No murmur heard. ?Pulmonary:  ?   Effort: Pulmonary effort is normal. No respiratory distress.  ?   Breath sounds: Normal breath sounds. No wheezing.  ?Abdominal:  ?   General: Bowel sounds are normal.  ?   Palpations: Abdomen is soft.  ?   Tenderness: There is no abdominal tenderness. There is no rebound.  ?Musculoskeletal:  ?   Cervical back: Neck supple.  ?Lymphadenopathy:  ?   Cervical: No cervical adenopathy.  ?Skin: ?   General: Skin is warm and dry.  ?Neurological:  ?   Mental Status: He is alert and oriented to person, place, and time.  ?Psychiatric:     ?   Mood and Affect: Mood normal.  ? ? ?ED Results / Procedures / Treatments    ?Labs ?(all labs ordered are listed, but only abnormal results are displayed) ?Labs Reviewed  ?RESP PANEL BY RT-PCR (FLU A&B, COVID) [id number]  ? ? ?EKG ?None ? ?Radiology ?DG Chest Portable 1 View ? ?Result Date: 02/15/2022 ?CLINICAL DATA:  61 year old male with cough and congestion for 4 days. Former smoker. EXAM: PORTABLE CHEST 1 VIEW COMPARISON:  Chest radiographs 06/10/2019 and earlier. FINDINGS: Portable AP upright view at 0534 hours. Lung volumes and mediastinal contours remain normal. Visualized tracheal air column is within normal limits. Allowing for portable technique the lungs are clear. No pneumothorax or pleural effusion. No acute osseous abnormality identified. Paucity of bowel gas in the upper abdomen. IMPRESSION: Negative portable chest. Electronically Signed   By: Genevie Ann M.D.   On: 02/15/2022 05:52   ? ?Procedures ?Procedures  ? ? ?Medications Ordered in ED ?Medications - No data to display ? ?ED Course/ Medical Decision Making/ A&P ?  ?                        ?Medical Decision Making ?Amount and/or Complexity of Data Reviewed ?Radiology: ordered. ? ? ?This patient presents to the ED for concern of upper respiratory symptoms, cough, this involves an extensive number of treatment options, and is a complaint that carries with it a high risk of complications and morbidity.  The differential diagnosis includes upper respiratory virus, pneumonia, seasonal allergies ? ?MDM:   ? ?Patient presents with several day history of congestion and cough.  He is nontoxic and vital signs are reassuring.  He is afebrile.  Blood pressure 165/88.  Breath sounds are clear.  He is in no respiratory distress.  O2 sats 96%.  He is a non-smoker.  Chest x-ray shows no evidence of pneumonia.  COVID and influenza testing are negative.  Discussed with the patient that I felt this was likely viral in nature.  May have some component of seasonal allergies as well.  Recommend supportive measures at home. ?(Labs, imaging) ? ?Labs: ?I  Ordered, and personally interpreted labs.  The pertinent results include: Negative COVID and influenza testing ? ?Imaging Studies ordered: ?I ordered imaging studies including chest x-ray negative for pneumonia ?I independently visualized and interpreted imaging. ?I agree with the radiologist interpretation ? ?Additional history obtained from chart review.  External records from outside source obtained and reviewed including prior evaluations ? ?Critical Interventions: ?N/A ? ?Consultations: ?I requested consultation with the NA,  and discussed lab and imaging findings as well as pertinent plan - they recommend: N/A ? ?Cardiac Monitoring: ?  The patient was maintained on a cardiac monitor.  I personally viewed and interpreted the cardiac monitored which showed an underlying rhythm of: Sinus rhythm ? ?Reevaluation: ?After the interventions noted above, I reevaluated the patient and found that they have :stayed the same ? ? ?Considered admission for: N/A ? ?Social Determinants of Health: ?Lives independently ? ?Disposition: Discharge ? ?Co morbidities that complicate the patient evaluation ? ?Past Medical History:  ?Diagnosis Date  ? CHF (congestive heart failure) ([place])   ? a. EF 40-45% in 03/2019 with NST showing no reversible ischemia  ? Diabetes mellitus without complication ([place])   ? Gout   ? Hyperlipidemia   ? Hypertension   ? PNA (pneumonia)   ?  ? ?Medicines ?No orders of the defined types were placed in this encounter. ?  ?I have reviewed the patients home medicines and have made adjustments as needed ? ?Problem List / ED Course: ?Problem List Items Addressed This Visit   ?None ?Visit Diagnoses   ? ? Viral URI with cough    -  Primary  ? ?  ?  ? ? ? ? ? ? ? ? ? ? ? ? ?Final Clinical Impression(s) / ED Diagnoses ?Final diagnoses:  ?Viral URI with cough  ? ? ?Rx / DC Orders ?ED Discharge Orders   ? ? None  ? ?  ? ? ?  ?Merryl Hacker, MD ?02/15/22 [phone] ? ?

## 2022-02-15 NOTE — Discharge Instructions (Signed)
You were seen today for cough and congestion.  This is likely viral in nature.  Continue your over-the-counter medications at home.  Your chest x-ray does not show any evidence of pneumonia. ?

## 2022-04-19 ENCOUNTER — Ambulatory Visit (INDEPENDENT_AMBULATORY_CARE_PROVIDER_SITE_OTHER): Payer: BC Managed Care – PPO | Admitting: Cardiology

## 2022-04-19 ENCOUNTER — Encounter: Payer: Self-pay | Admitting: *Deleted

## 2022-04-19 ENCOUNTER — Encounter: Payer: Self-pay | Admitting: Cardiology

## 2022-04-19 VITALS — BP 128/80 | HR 105 | Ht 64.0 in | Wt 312.0 lb

## 2022-04-19 DIAGNOSIS — I5032 Chronic diastolic (congestive) heart failure: Secondary | ICD-10-CM | POA: Diagnosis not present

## 2022-04-19 DIAGNOSIS — R002 Palpitations: Secondary | ICD-10-CM

## 2022-04-19 DIAGNOSIS — I10 Essential (primary) hypertension: Secondary | ICD-10-CM

## 2022-04-19 MED ORDER — FUROSEMIDE 20 MG PO TABS: 20.0000 mg | ORAL_TABLET | Freq: Every day | ORAL | 3 refills | Status: DC

## 2022-04-19 NOTE — Patient Instructions (Signed)

## 2022-04-19 NOTE — Progress Notes (Signed)
Clinical Summary Roberto Guerra is a 61 y.o.maleseen today for follow up of the following medical problems.      1. Chronic systolic HF - new diagnosis during 03/2019 admission in setting of HCAP - 03/2019 echo LVEF 40-45%, grade II diastolic dysfunction - 06/5461 echo LVEF 55-60%, grade II diastolic dysfunction     - last visit lowered lasix to '20mg'$  daily for some orthostatic symptoms, symptoms improved - no recent SOb/DOE, no LE edema - compliant with meds    2. Palpitations - 05/2019 heart monitor 14 day 14 day monitor Occasional PVCs, rare couplets. Isolated 4 beat run of NSVT Occasioanl PACs, rare couplets and triplets No symptoms reported     - doing well.      3. Chest pain - multiple admits for chest pain, no clear signs of ischemia - 03/2019 nuclear stress no ischemia.  - no symptoms.    4. HTN - compliant with meds     5. Dizziness - seen in ER 03/13/20 with dizziness - normal EKG and labs. CT head no acute findings - feeling of room spinning, though occurs with standing.  - better with meclizine, thought only occurs with standing.    - no recurrecne, resovled with lower lasix dosing.      Past Medical History:  Diagnosis Date   CHF (congestive heart failure) ([place])    a. EF 40-45% in 03/2019 with NST showing no reversible ischemia   Diabetes mellitus without complication (HCC)    Gout    Hyperlipidemia    Hypertension    PNA (pneumonia)      No Known Allergies   Current Outpatient Medications  Medication Sig Dispense Refill   furosemide (LASIX) 20 MG tablet Take 1 tablet by mouth once daily 7 tablet 0   allopurinol (ZYLOPRIM) 300 MG tablet Take 300 mg by mouth daily.     carvedilol (COREG) 12.5 MG tablet TAKE 1 TABLET BY MOUTH TWICE DAILY WITH A MEAL 180 tablet 0   gabapentin (NEURONTIN) 300 MG capsule Take 300 mg by mouth 3 (three) times daily.      HYDROcodone-acetaminophen (NORCO/VICODIN) 5-325 MG tablet Take 1 tablet by mouth every 6 (six)  hours as needed for severe pain.     meclizine (ANTIVERT) 25 MG tablet Take 1 tablet (25 mg total) by mouth 3 (three) times daily as needed for dizziness. 30 tablet 0   metFORMIN (GLUCOPHAGE) 1000 MG tablet Take 1,000 mg by mouth 2 (two) times daily.      nitroGLYCERIN (NITROSTAT) 0.4 MG SL tablet Place 1 tablet (0.4 mg total) under the tongue every 5 (five) minutes as needed for chest pain (CP or SOB). 25 tablet 3   olmesartan (BENICAR) 40 MG tablet Take 1 tablet (40 mg total) by mouth daily.     Omega-3 Fatty Acids (FISH OIL) 1000 MG CAPS Take 1,000 mg by mouth daily.     potassium chloride (KLOR-CON) 10 MEQ tablet Take 1 tablet by mouth once daily 90 tablet 3   pravastatin (PRAVACHOL) 40 MG tablet Take 40 mg by mouth every morning.      No current facility-administered medications for this visit.     Past Surgical History:  Procedure Laterality Date   CATARACT EXTRACTION W/PHACO Left 10/29/2018   Procedure: CATARACT EXTRACTION PHACO AND INTRAOCULAR LENS PLACEMENT (IOC);  Surgeon: Tonny Ermalinda Joubert, MD;  Location: [facility];  Service: Ophthalmology;  Laterality: Left;  CDE: 43.37   COLONOSCOPY N/A 02/27/2014   Procedure: COLONOSCOPY;  Surgeon: Rogene Houston, MD;  Location: [facility];  Service: Endoscopy;  Laterality: N/A;  830   NO PAST SURGERIES       No Known Allergies    Family History  Problem Relation Age of Onset   Diabetes Other    Diabetes Sister    Colon cancer Neg Hx      Social History Roberto Guerra reports that he quit smoking about 23 years ago. His smoking use included cigarettes. He has a 0.25 pack-year smoking history. He has never used smokeless tobacco. Roberto Guerra reports no history of alcohol use.   Review of Systems CONSTITUTIONAL: No weight loss, fever, chills, weakness or fatigue.  HEENT: Eyes: No visual loss, blurred vision, double vision or yellow sclerae.No hearing loss, sneezing, congestion, runny nose or sore throat.  SKIN: No rash or itching.   CARDIOVASCULAR: per hpi RESPIRATORY: No shortness of breath, cough or sputum.  GASTROINTESTINAL: No anorexia, nausea, vomiting or diarrhea. No abdominal pain or blood.  GENITOURINARY: No burning on urination, no polyuria NEUROLOGICAL: No headache, dizziness, syncope, paralysis, ataxia, numbness or tingling in the extremities. No change in bowel or bladder control.  MUSCULOSKELETAL: No muscle, back pain, joint pain or stiffness.  LYMPHATICS: No enlarged nodes. No history of splenectomy.  PSYCHIATRIC: No history of depression or anxiety.  ENDOCRINOLOGIC: No reports of sweating, cold or heat intolerance. No polyuria or polydipsia.  Marland Kitchen   Physical Examination Today's Vitals   04/19/22 1527  BP: 128/80  Pulse: (!) 105  SpO2: 95%  Weight: (!) 312 lb (141.5 kg)  Height: '5\' 4"'$  (1.626 m)   Body mass index is 53.55 kg/m.  Gen: resting comfortably, no acute distress HEENT: no scleral icterus, pupils equal round and reactive, no palptable cervical adenopathy,  CV: RRR, no mr/g no jvd Resp: Clear to auscultation bilaterally GI: abdomen is soft, non-tender, non-distended, normal bowel sounds, no hepatosplenomegaly MSK: extremities are warm, no edema.  Skin: warm, no rash Neuro:  no focal deficits Psych: appropriate affect   Diagnostic Studies 03/2019 echo IMPRESSIONS      1. The left ventricle has mild-moderately reduced systolic function, with an ejection fraction of 40-45%. The cavity size was normal. There is mild concentric left ventricular hypertrophy. Left ventricular diastolic Doppler parameters are consistent  with pseudonormalization. Elevated mean left atrial pressure.  2. The right ventricle has normal systolic function. The cavity was normal. There is no increase in right ventricular wall thickness. Right ventricular systolic pressure could not be assessed.  3. There is mild mitral annular calcification present.  4. The tricuspid valve is grossly normal.     05/2019 heart  monitor 14 day 14 day monitor Occasional PVCs, rare couplets. Isolated 4 beat run of NSVT Occasioanl PACs, rare couplets and triplets No symptoms reported   07/2019 echo IMPRESSIONS      1. The left ventricle has normal systolic function, with an ejection fraction of 55-60%. Left ventricular diastolic Doppler parameters are consistent with pseudonormal.  2. The right ventricle has normal systolc function. The cavity was normal. There is no increase in right ventricular wall thickness.  3. The mitral valve is grossly normal.  4. The tricuspid valve was grossly normal.  5. The aortic valve is grossly normal.  6. The aortic root is normal in size and structure.  7. The interatrial septum was not well visualized.     03/2019 nuclear stress test There was no ST segment deviation noted during stress. No T wave inversion was noted  during stress. Defect 1: There is a medium defect of mild severity present in the basal inferior location. Defect 2: There is a small defect of mild severity present in the apex location. This is a high risk study. The left ventricular ejection fraction is severely decreased (<30%). Nuclear stress EF: 25%.    Assessment and Plan   1. HFpEF - LVEF has normalized, ongonig diastolic dysfunction - no symptoms, continue current meds     2. HTN -at goal, continue current meds     3. Palpitations - previous monitor with benign ectopy - symptoms controlled on coreg, continue - EKG today mild sinus tach 105   F/u 1 year   Arnoldo Lenis, M.D.

## 2022-05-25 DIAGNOSIS — G894 Chronic pain syndrome: Secondary | ICD-10-CM | POA: Diagnosis not present

## 2022-05-25 DIAGNOSIS — I951 Orthostatic hypotension: Secondary | ICD-10-CM | POA: Diagnosis not present

## 2022-05-25 DIAGNOSIS — R42 Dizziness and giddiness: Secondary | ICD-10-CM | POA: Diagnosis not present

## 2022-06-06 DIAGNOSIS — Z125 Encounter for screening for malignant neoplasm of prostate: Secondary | ICD-10-CM | POA: Diagnosis not present

## 2022-06-06 DIAGNOSIS — E119 Type 2 diabetes mellitus without complications: Secondary | ICD-10-CM | POA: Diagnosis not present

## 2022-06-06 DIAGNOSIS — I10 Essential (primary) hypertension: Secondary | ICD-10-CM | POA: Diagnosis not present

## 2022-06-12 ENCOUNTER — Encounter (HOSPITAL_COMMUNITY): Payer: Self-pay | Admitting: Emergency Medicine

## 2022-06-12 ENCOUNTER — Emergency Department (HOSPITAL_COMMUNITY)
Admission: EM | Admit: 2022-06-12 | Discharge: 2022-06-12 | Disposition: A | Discharge status: Home / Self Care | Payer: BC Managed Care – PPO | Attending: Emergency Medicine | Admitting: Emergency Medicine

## 2022-06-12 ENCOUNTER — Other Ambulatory Visit: Payer: Self-pay

## 2022-06-12 DIAGNOSIS — L02212 Cutaneous abscess of back [any part, except buttock]: Secondary | ICD-10-CM | POA: Diagnosis not present

## 2022-06-12 DIAGNOSIS — E119 Type 2 diabetes mellitus without complications: Secondary | ICD-10-CM | POA: Diagnosis not present

## 2022-06-12 DIAGNOSIS — I110 Hypertensive heart disease with heart failure: Secondary | ICD-10-CM | POA: Insufficient documentation

## 2022-06-12 DIAGNOSIS — L0291 Cutaneous abscess, unspecified: Secondary | ICD-10-CM | POA: Diagnosis not present

## 2022-06-12 DIAGNOSIS — Z87891 Personal history of nicotine dependence: Secondary | ICD-10-CM | POA: Insufficient documentation

## 2022-06-12 DIAGNOSIS — I509 Heart failure, unspecified: Secondary | ICD-10-CM | POA: Diagnosis not present

## 2022-06-12 MED ORDER — LIDOCAINE HCL (PF) 1 % IJ SOLN
30.0000 mL | Freq: Once | INTRAMUSCULAR | Status: AC
  Administered 2022-06-12: 30 mL
  Filled 2022-06-12: qty 30

## 2022-06-12 MED ORDER — SULFAMETHOXAZOLE-TRIMETHOPRIM 800-160 MG PO TABS
1.0000 | ORAL_TABLET | Freq: Once | ORAL | Status: AC
  Administered 2022-06-12: 1 via ORAL
  Filled 2022-06-12: qty 1

## 2022-06-12 MED ORDER — NAPROXEN 500 MG PO TABS
500.0000 mg | ORAL_TABLET | Freq: Two times a day (BID) | ORAL | 0 refills | Status: DC
Start: 2022-06-12 — End: 2023-04-10

## 2022-06-12 MED ORDER — NAPROXEN 250 MG PO TABS
500.0000 mg | ORAL_TABLET | Freq: Once | ORAL | Status: AC
  Administered 2022-06-12: 500 mg via ORAL
  Filled 2022-06-12: qty 2

## 2022-06-12 MED ORDER — SULFAMETHOXAZOLE-TRIMETHOPRIM 800-160 MG PO TABS: 1.0000 | ORAL_TABLET | Freq: Two times a day (BID) | ORAL | 0 refills | Status: AC

## 2022-06-12 NOTE — ED Provider Notes (Signed)
[facility] Emergency Department Provider Note MRN:  [id number]  Arrival date & time: 06/12/22     Chief Complaint   Abscess   History of Present Illness   Roberto Guerra is a 61 y.o. year-old male with a history of CHF, hypertension presenting to the ED with chief complaint of abscess.  Abscess on the back for the past few days, not draining on its own, no fever, endorsing discomfort to the area.  Worse when laying flat.  Review of Systems  A thorough review of systems was obtained and all systems are negative except as noted in the HPI and PMH.   Patient's Health History    Past Medical History:  Diagnosis Date   CHF (congestive heart failure) ([place])    a. EF 40-45% in 03/2019 with NST showing no reversible ischemia   Diabetes mellitus without complication (HCC)    Gout    Hyperlipidemia    Hypertension    PNA (pneumonia)     Past Surgical History:  Procedure Laterality Date   CATARACT EXTRACTION W/PHACO Left 10/29/2018   Procedure: CATARACT EXTRACTION PHACO AND INTRAOCULAR LENS PLACEMENT ([place]);  Surgeon: Tonny Branch, MD;  Location: [facility];  Service: Ophthalmology;  Laterality: Left;  CDE: 43.37   COLONOSCOPY N/A 02/27/2014   Procedure: COLONOSCOPY;  Surgeon: Rogene Houston, MD;  Location: [facility];  Service: Endoscopy;  Laterality: N/A;  10   NO PAST SURGERIES      Family History  Problem Relation Age of Onset   Diabetes Other    Diabetes Sister    Colon cancer Neg Hx     Social History   Socioeconomic History   Marital status: Married    Spouse name: Not on file   Number of children: Not on file   Years of education: 11   Highest education level: Not on file  Occupational History    Employer: [employer]  Tobacco Use   Smoking status: Former    Packs/day: 0.25    Years: 1.00    Total pack years: 0.25    Types: Cigarettes    Quit date: 11/30/1998    Years since quitting: 23.5   Smokeless tobacco: Never  Vaping Use    Vaping Use: Never used  Substance and Sexual Activity   Alcohol use: No   Drug use: No   Sexual activity: Not on file  Other Topics Concern   Not on file  Social History Narrative   Lives with wife.  Daughter at home.  Supervisor.     Social Determinants of Health   Financial Resource Strain: Not on file  Food Insecurity: Not on file  Transportation Needs: Not on file  Physical Activity: Not on file  Stress: Not on file  Social Connections: Not on file  Intimate Partner Violence: Not on file     Physical Exam   Vitals:   06/12/22 0502  BP: 135/82  Pulse: 87  Resp: 18  Temp: 98.5 F (36.9 C)  SpO2: 97%    CONSTITUTIONAL: Well-appearing, NAD NEURO/PSYCH:  Alert and oriented x 3, no focal deficits EYES:  eyes equal and reactive ENT/NECK:  no LAD, no JVD CARDIO: Regular rate, well-perfused, normal S1 and S2 PULM:  CTAB no wheezing or rhonchi GI/GU:  non-distended, non-tender MSK/SPINE:  No gross deformities, no edema SKIN:  no rash, atraumatic, large 7 cm fluctuant nodule to the right thoracic back, superficial skin breakdown   *Additional and/or pertinent findings included in MDM  below  Diagnostic and Interventional Summary    EKG Interpretation  Date/Time:    Ventricular Rate:    PR Interval:    QRS Duration:   QT Interval:    QTC Calculation:   R Axis:     Text Interpretation:         Labs Reviewed - No data to display  No orders to display    Medications  sulfamethoxazole-trimethoprim (BACTRIM DS) 800-160 MG per tablet 1 tablet (has no administration in time range)  naproxen (NAPROSYN) tablet 500 mg (has no administration in time range)  lidocaine (PF) (XYLOCAINE) 1 % injection 30 mL (30 mLs Infiltration Given 06/12/22 0524)     Procedures  /  Critical Care .Marland KitchenIncision and Drainage  Date/Time: 06/12/2022 5:38 AM  Performed by: Maudie Flakes, MD Authorized by: Maudie Flakes, MD   Consent:    Consent obtained:  Verbal   Consent given by:   Patient   Risks, benefits, and alternatives were discussed: yes     Risks discussed:  Bleeding, damage to other organs, infection, incomplete drainage and pain   Alternatives discussed:  No treatment Universal protocol:    Procedure explained and questions answered to patient or proxy's satisfaction: yes     Immediately prior to procedure, a time out was called: yes     Patient identity confirmed:  Verbally with patient Location:    Type:  Abscess   Size:  7cm   Location:  Trunk   Trunk location:  Back Pre-procedure details:    Skin preparation:  Chlorhexidine with alcohol Sedation:    Sedation type:  None Anesthesia:    Anesthesia method:  Local infiltration   Local anesthetic:  Lidocaine 1% w/o epi Procedure type:    Complexity:  Complex Procedure details:    Incision types:  Cruciate   Incision depth:  Subcutaneous   Wound management:  Probed and deloculated, irrigated with saline, extensive cleaning and debrided   Drainage:  Purulent   Drainage amount:  Copious   Wound treatment:  Wound left open   Packing materials:  None Post-procedure details:    Procedure completion:  Tolerated well, no immediate complications   ED Course and Medical Decision Making  Initial Impression and Ddx Exam is consistent with abscess, will I&D.  No systemic symptoms.  Past medical/surgical history that increases complexity of ED encounter: None  Interpretation of Diagnostics Laboratory and/or imaging options to aid in the diagnosis/care of the patient were considered.  After careful history and physical examination, it was determined that there was no indication for diagnostics at this time.  Patient Reassessment and Ultimate Disposition/Management     See procedural details above, appropriate for discharge.  Patient management required discussion with the following services or consulting groups:  None  Complexity of Problems Addressed Acute complicated illness or  Injury  Additional Data Reviewed and Analyzed Further history obtained from: None  Additional Factors Impacting ED Encounter Risk Minor Procedures  Barth Kirks. Sedonia Small, Helper [email or web address]  Final Clinical Impressions(s) / ED Diagnoses     ICD-10-CM   1. Abscess  L02.91       ED Discharge Orders          Ordered    naproxen (NAPROSYN) 500 MG tablet  2 times daily        06/12/22 0538    sulfamethoxazole-trimethoprim (BACTRIM DS) 800-160 MG tablet  2 times daily  06/12/22 0538             Discharge Instructions Discussed with and Provided to Patient:     Discharge Instructions      You were evaluated in the Emergency Department and after careful evaluation, we did not find any emergent condition requiring admission or further testing in the hospital.  Your exam/testing today is overall reassuring.  We drained your abscess here in the emergency department.  The area will drain for the next day or two.  This is normal.  Recommend using the Naprosyn twice daily for pain as needed.  Take the Bactrim twice daily as well to help clear any infection.  Please return to the Emergency Department if you experience any worsening of your condition.   Thank you for allowing [place] to be a part of your care.       Maudie Flakes, MD 06/12/22 [phone]

## 2022-06-12 NOTE — ED Triage Notes (Signed)
Pt states he has an abscess on his back since Monday and that it is "irritating him".

## 2022-06-12 NOTE — Discharge Instructions (Signed)
You were evaluated in the Emergency Department and after careful evaluation, we did not find any emergent condition requiring admission or further testing in the hospital.  Your exam/testing today is overall reassuring.  We drained your abscess here in the emergency department.  The area will drain for the next day or two.  This is normal.  Recommend using the Naprosyn twice daily for pain as needed.  Take the Bactrim twice daily as well to help clear any infection.  Please return to the Emergency Department if you experience any worsening of your condition.   Thank you for allowing [place] to be a part of your care.

## 2022-06-14 DIAGNOSIS — I5022 Chronic systolic (congestive) heart failure: Secondary | ICD-10-CM | POA: Diagnosis not present

## 2022-06-14 DIAGNOSIS — E1165 Type 2 diabetes mellitus with hyperglycemia: Secondary | ICD-10-CM | POA: Diagnosis not present

## 2022-06-14 DIAGNOSIS — E782 Mixed hyperlipidemia: Secondary | ICD-10-CM | POA: Diagnosis not present

## 2022-06-14 DIAGNOSIS — I10 Essential (primary) hypertension: Secondary | ICD-10-CM | POA: Diagnosis not present

## 2022-08-14 ENCOUNTER — Encounter: Payer: Self-pay | Admitting: Emergency Medicine

## 2022-08-14 ENCOUNTER — Ambulatory Visit
Admission: EM | Admit: 2022-08-14 | Discharge: 2022-08-14 | Disposition: A | Discharge status: Home / Self Care | Payer: Managed Care, Other (non HMO) | Attending: Family Medicine | Admitting: Family Medicine

## 2022-08-14 ENCOUNTER — Other Ambulatory Visit: Payer: Self-pay

## 2022-08-14 DIAGNOSIS — M25511 Pain in right shoulder: Secondary | ICD-10-CM

## 2022-08-14 DIAGNOSIS — M79604 Pain in right leg: Secondary | ICD-10-CM

## 2022-08-14 MED ORDER — TIZANIDINE HCL 4 MG PO CAPS: 4.0000 mg | ORAL_CAPSULE | Freq: Three times a day (TID) | ORAL | 0 refills | Status: DC | PRN

## 2022-08-14 NOTE — ED Triage Notes (Signed)
Pt reports right shoulder pain and right knee pain x2 weeks. Pt reports dr. Aline Brochure reported that pt needed right knee replacement. Denies any known injury.

## 2022-08-14 NOTE — ED Provider Notes (Signed)
[facility]    CSN: [id number] Arrival date & time: 08/14/22  0814      History   Chief Complaint Chief Complaint  Patient presents with   Shoulder Pain    HPI Roberto Guerra is a 61 y.o. male.   Patient presenting today with 2-week history of right anterior shoulder pain and right knee and upper leg pain.  He states he knows he has significant arthritis in his knee and his orthopedist has told him he needs a knee replacement, thinks this is where his knee pain is coming from.  No injury to the shoulder that he recalls but having consistent pain to the anterior aspect especially with movement.  Denies swelling, discoloration, numbness tingling or weakness of the arm but states the pain sometimes radiates down the arm with movement.  Taking Tylenol with minimal relief.  States he does have naproxen at home but has not been taking it for this episode.    Past Medical History:  Diagnosis Date   CHF (congestive heart failure) ([place])    a. EF 40-45% in 03/2019 with NST showing no reversible ischemia   Diabetes mellitus without complication (HCC)    Gout    Hyperlipidemia    Hypertension    PNA (pneumonia)     Patient Active Problem List   Diagnosis Date Noted   Atypical chest pain 06/10/2019   Dyspnea 03/30/2019   Tachycardia 03/30/2019   Neuralgia of lower extremity 08/15/2012   ERECTILE DYSFUNCTION 08/29/2008   CERUMEN IMPACTION, BILATERAL 06/27/2008   GOUT 09/21/2007   Hyperlipidemia 10/12/2006   OBESITY NOS 10/12/2006   Essential hypertension 10/12/2006   GERD 10/12/2006   HIATAL HERNIA 10/12/2006    Past Surgical History:  Procedure Laterality Date   CATARACT EXTRACTION W/PHACO Left 10/29/2018   Procedure: CATARACT EXTRACTION PHACO AND INTRAOCULAR LENS PLACEMENT ([place]);  Surgeon: Tonny Branch, MD;  Location: [facility];  Service: Ophthalmology;  Laterality: Left;  CDE: 43.37   COLONOSCOPY N/A 02/27/2014   Procedure: COLONOSCOPY;  Surgeon: Rogene Houston,  MD;  Location[facility];  Service: Endoscopy;  Laterality: N/A;  830   NO PAST SURGERIES         Home Medications    Prior to Admission medications   Medication Sig Start Date End Date Taking? Authorizing Provider  tiZANidine (ZANAFLEX) 4 MG capsule Take 1 capsule (4 mg total) by mouth 3 (three) times daily as needed for muscle spasms. Do not drink alcohol or drive while taking this medication.  May cause drowsiness. 08/14/22  Yes Volney American, PA-C  allopurinol (ZYLOPRIM) 300 MG tablet Take 300 mg by mouth daily.    [provider]  carvedilol (COREG) 12.5 MG tablet TAKE 1 TABLET BY MOUTH TWICE DAILY WITH A MEAL 03/26/21   Arnoldo Lenis, MD  furosemide (LASIX) 20 MG tablet Take 1 tablet (20 mg total) by mouth daily. 04/19/22   Arnoldo Lenis, MD  gabapentin (NEURONTIN) 300 MG capsule Take 300 mg by mouth 3 (three) times daily.     [provider]  HYDROcodone-acetaminophen (NORCO/VICODIN) 5-325 MG tablet Take 1 tablet by mouth every 6 (six) hours as needed for severe pain.    [provider]  meclizine (ANTIVERT) 25 MG tablet Take 1 tablet (25 mg total) by mouth 3 (three) times daily as needed for dizziness. 03/13/20   Orpah Greek, MD  metFORMIN (GLUCOPHAGE) 1000 MG tablet Take 1,000 mg by mouth 2 (two) times daily.  Patient  not taking: Reported on 04/19/2022    [provider]  naproxen (NAPROSYN) 500 MG tablet Take 1 tablet (500 mg total) by mouth 2 (two) times daily. 06/12/22   Maudie Flakes, MD  nitroGLYCERIN (NITROSTAT) 0.4 MG SL tablet Place 1 tablet (0.4 mg total) under the tongue every 5 (five) minutes as needed for chest pain (CP or SOB). 11/05/20 04/20/23  Strader, Fransisco Hertz, PA-C  olmesartan (BENICAR) 40 MG tablet Take 1 tablet (40 mg total) by mouth daily. 11/05/20   Strader, Fransisco Hertz, PA-C  Omega-3 Fatty Acids (FISH OIL) 1000 MG CAPS Take 1,000 mg by mouth daily.    [provider]  potassium chloride  (KLOR-CON) 10 MEQ tablet Take 1 tablet by mouth once daily 05/06/20   Arnoldo Lenis, MD  pravastatin (PRAVACHOL) 40 MG tablet Take 40 mg by mouth every morning.     [provider]    Family History Family History  Problem Relation Age of Onset   Diabetes Other    Diabetes Sister    Colon cancer Neg Hx     Social History Social History   Tobacco Use   Smoking status: Former    Packs/day: 0.25    Years: 1.00    Total pack years: 0.25    Types: Cigarettes    Quit date: 11/30/1998    Years since quitting: 23.7   Smokeless tobacco: Never  Vaping Use   Vaping Use: Never used  Substance Use Topics   Alcohol use: No   Drug use: No     Allergies   Patient has no known allergies.   Review of Systems Review of Systems Per HPI  Physical Exam Triage Vital Signs ED Triage Vitals  Enc Vitals Group     BP 08/14/22 0856 (!) 147/88     Pulse Rate 08/14/22 0856 78     Resp 08/14/22 0856 20     Temp 08/14/22 0856 98 F (36.7 C)     Temp Source 08/14/22 0856 Oral     SpO2 08/14/22 0856 92 %     Weight --      Height --      Head Circumference --      Peak Flow --      Pain Score 08/14/22 0857 7     Pain Loc --      Pain Edu? --      Excl. in [place]? --    No data found.  Updated Vital Signs BP (!) 147/88 (BP Location: Right Arm)   Pulse 78   Temp 98 F (36.7 C) (Oral)   Resp 20   SpO2 92%   Visual Acuity Right Eye Distance:   Left Eye Distance:   Bilateral Distance:    Right Eye Near:   Left Eye Near:    Bilateral Near:     Physical Exam Vitals and nursing note reviewed.  Constitutional:      Appearance: Normal appearance.  HENT:     Head: Atraumatic.  Eyes:     Extraocular Movements: Extraocular movements intact.     Conjunctiva/sclera: Conjunctivae normal.  Cardiovascular:     Rate and Rhythm: Normal rate and regular rhythm.  Pulmonary:     Effort: Pulmonary effort is normal.     Breath sounds: Normal breath sounds.  Musculoskeletal:         General: Tenderness present. No swelling, deformity or signs of injury. Normal range of motion.     Cervical back: Normal range  of motion and neck supple.     Comments: Right anterior shoulder tenderness to palpation without bony deformity palpable.  Range of motion and strength intact  Skin:    General: Skin is warm and dry.     Findings: No bruising or erythema.  Neurological:     General: No focal deficit present.     Mental Status: He is oriented to person, place, and time.     Comments: Bilateral upper and lower extremities neurovascularly intact  Psychiatric:        Mood and Affect: Mood normal.        Thought Content: Thought content normal.        Judgment: Judgment normal.      UC Treatments / Results  Labs (all labs ordered are listed, but only abnormal results are displayed) Labs Reviewed - No data to display  EKG   Radiology No results found.  Procedures Procedures (including critical care time)  Medications Ordered in UC Medications - No data to display  Initial Impression / Assessment and Plan / UC Course  I have reviewed the triage vital signs and the nursing notes.  Pertinent labs & imaging results that were available during my care of the patient were reviewed by me and considered in my medical decision making (see chart for details).     Suspect arthritic in nature, states he already has naproxen at home and will start using this and will send Zanaflex for possible muscle soreness component, impingement causing the radiation of pain down the arm.  Follow-up with orthopedist if not fully resolving. Final Clinical Impressions(s) / UC Diagnoses   Final diagnoses:  Acute pain of right shoulder  Right leg pain   Discharge Instructions   None    ED Prescriptions     Medication Sig Dispense Auth. Provider   tiZANidine (ZANAFLEX) 4 MG capsule Take 1 capsule (4 mg total) by mouth 3 (three) times daily as needed for muscle spasms. Do not drink  alcohol or drive while taking this medication.  May cause drowsiness. 15 capsule Volney American, [place]      PDMP not reviewed this encounter.   Merrie Roof [place], [place] 08/14/22 [phone]

## 2022-08-17 DIAGNOSIS — M7541 Impingement syndrome of right shoulder: Secondary | ICD-10-CM | POA: Diagnosis not present

## 2022-08-17 DIAGNOSIS — G5602 Carpal tunnel syndrome, left upper limb: Secondary | ICD-10-CM | POA: Diagnosis not present

## 2022-08-17 DIAGNOSIS — G894 Chronic pain syndrome: Secondary | ICD-10-CM | POA: Diagnosis not present

## 2022-09-20 DIAGNOSIS — E782 Mixed hyperlipidemia: Secondary | ICD-10-CM | POA: Diagnosis not present

## 2022-09-20 DIAGNOSIS — E119 Type 2 diabetes mellitus without complications: Secondary | ICD-10-CM | POA: Diagnosis not present

## 2022-09-20 DIAGNOSIS — I10 Essential (primary) hypertension: Secondary | ICD-10-CM | POA: Diagnosis not present

## 2022-09-26 DIAGNOSIS — E782 Mixed hyperlipidemia: Secondary | ICD-10-CM | POA: Diagnosis not present

## 2022-09-26 DIAGNOSIS — I10 Essential (primary) hypertension: Secondary | ICD-10-CM | POA: Diagnosis not present

## 2022-09-26 DIAGNOSIS — E1165 Type 2 diabetes mellitus with hyperglycemia: Secondary | ICD-10-CM | POA: Diagnosis not present

## 2022-09-26 DIAGNOSIS — I5022 Chronic systolic (congestive) heart failure: Secondary | ICD-10-CM | POA: Diagnosis not present

## 2022-11-30 DIAGNOSIS — Z961 Presence of intraocular lens: Secondary | ICD-10-CM | POA: Diagnosis not present

## 2022-11-30 DIAGNOSIS — E119 Type 2 diabetes mellitus without complications: Secondary | ICD-10-CM | POA: Diagnosis not present

## 2022-11-30 DIAGNOSIS — H2511 Age-related nuclear cataract, right eye: Secondary | ICD-10-CM | POA: Diagnosis not present

## 2022-11-30 DIAGNOSIS — H25811 Combined forms of age-related cataract, right eye: Secondary | ICD-10-CM | POA: Diagnosis not present

## 2022-11-30 DIAGNOSIS — H5201 Hypermetropia, right eye: Secondary | ICD-10-CM | POA: Diagnosis not present

## 2022-12-15 ENCOUNTER — Ambulatory Visit: Payer: 59 | Admitting: Orthopedic Surgery

## 2022-12-20 NOTE — H&P (Signed)
Surgical History & Physical  Patient Name: Roberto Guerra  DOB: Jun 26, 1961  Surgery: Cataract extraction with intraocular lens implant phacoemulsification; Right Eye Surgeon: Ronn Melena MD Surgery Date: 12/30/2022 Pre-Op Date: 11/30/2022  HPI: A 11 Yr. old male patient presents for a cataract evaluation OD. Patient had cataract sx OS with Dr. Geoffry Paradise in 2019. Patient is diabetic and has been for the past 6 years. A1C% 6.6. Patient reports that he has difficulty with OD and states that his vision is blurred and hazy. Patient reports that he has difficulty with small print and struggles with forms and reading. Patient reports that he is bothered by the glare he has on bright days as well as the glare he receives from headlights. Patient states that he has difficulty with seeing television captioning as well OD.  Medical History: CE/IOL OS 10/29/18   Diabetes - DM Type 2 GERD High Blood Pressure LDL  Review of Systems Negative Allergic/Immunologic Negative Constitutional Negative Ear, Nose, Mouth & Throat Negative Endocrine Negative Eyes Negative Gastrointestinal Negative Genitourinary Negative Hematologic/Lymphatic Negative Integumentary Negative Musculoskeletal Negative Neurological Negative Psychiatry Negative Respiratory Cardiovascular High Blood Pressure All recorded systems are negative except as noted above.  Social Former smoker   Medication OLMESARTAN MEDOXOMIL, POTASSIUM CL ER, ALLOPURINOL, HYDROCODONE-ACETAMIN, MOUNJARO, FUROSEMIDE, GABAPENTIN, Nitroglycerin, Carvedilol, Amlodipine, Pravastatin  Sx/Procedures CE/IOL   Drug Allergies  NKDA  History & Physical: Heent: cataract OD, PC-IOL OS NECK: supple without bruits LUNGS: lungs clear to auscultation CV: regular rate and rhythm Abdomen: soft and non-tender  Impression & Plan: Assessment: 1.  CATARACT AGE-RELATED COMBINED FORMS; Right Eye (H25.811) 2.  INTRAOCULAR LENS IOL ; Left Eye (Z96.1) 3.   Diabetes Type 2 No retinopathy (E11.9) 4.  Hyperopia ; Right Eye (H52.01) 5.  Presbyopia ; Both Eyes Left Eye (H52.4)  Plan: 1.  Cataracts are visually significant and account for the patient's complaints. Discussed all risks, benefits, procedures and recovery, including infection, loss of vision and eye, need for glasses after surgery or additional procedures. Patient understands changing glasses will not improve vision. Patient indicated understanding of procedure. All questions answered. Patient desires to have surgery, recommend phacoemulsification with intraocular lens. Patient to have preliminary testing necessary ([employer]/IOL Master, Mac OCT, TOPO) Educational materials provided.  Plan: - proceed with surgery OD - RayOne Lens, target first minus lens - Ok with lying flat - DM no DR, no fuchs, good dilation  2.  Doing well, performed in 10/2018 with Dr. Geoffry Paradise  3.  Discussed the need for ongoing proactive ocular exams and treatment, hopefully before visual symptoms develop. Last A1c 6.6  4.  Hold on glasses for now  5.  Hold on glasses for now

## 2022-12-23 ENCOUNTER — Encounter (HOSPITAL_COMMUNITY)
Admission: RE | Admit: 2022-12-23 | Discharge: 2022-12-23 | Disposition: A | Discharge status: Home / Self Care | Payer: BC Managed Care – PPO | Source: Ambulatory Visit | Attending: Optometry | Admitting: Optometry

## 2022-12-23 DIAGNOSIS — H2511 Age-related nuclear cataract, right eye: Secondary | ICD-10-CM | POA: Diagnosis not present

## 2022-12-29 ENCOUNTER — Encounter: Payer: Self-pay | Admitting: Orthopedic Surgery

## 2022-12-29 ENCOUNTER — Ambulatory Visit (INDEPENDENT_AMBULATORY_CARE_PROVIDER_SITE_OTHER): Payer: BC Managed Care – PPO | Admitting: Orthopedic Surgery

## 2022-12-29 ENCOUNTER — Ambulatory Visit (INDEPENDENT_AMBULATORY_CARE_PROVIDER_SITE_OTHER): Payer: BC Managed Care – PPO

## 2022-12-29 VITALS — Ht 64.0 in | Wt 290.0 lb

## 2022-12-29 DIAGNOSIS — M1711 Unilateral primary osteoarthritis, right knee: Secondary | ICD-10-CM

## 2022-12-29 DIAGNOSIS — M1710 Unilateral primary osteoarthritis, unspecified knee: Secondary | ICD-10-CM

## 2022-12-29 NOTE — Progress Notes (Signed)
Chief Complaint  Patient presents with   Knee Pain    Right    Roberto Guerra is 62 years old his BMI is 68 he is being followed for osteoarthritis he received an injection December 14, 2020  At that time he was on hydrocodone and Mobic  Roberto Guerra says his knee is hurting a little bit  As far as functional activity goes.  He says he uses a push cart and when he shops  He can get out of a chair with a little bit of difficulty.  It is more difficult him for sit on the commode  He says he walks pretty good occasionally having to limp  He takes pain medication as he had some difficulty with blood pressure and was taken off of Mobic  His x-rays today show slight progression in the medial compartment arthrosis.  He has a varus alignment to the knee which has been persistent.  He has retropatellar arthritis as well.  The subchondral bone changes are minimal he does have an osteophyte complex on the medial side  His BMI still 49.78  At this time we will continue nonoperative treatment.  I will see him in a year  We injected his right knee for pain relief and control of inflammation  The right knee was not warm to touch she had medial compartment tenderness some diffuse tenderness around the patella flexion arc was 115 degrees  Procedure note right knee injection   verbal consent was obtained to inject right knee joint  Timeout was completed to confirm the site of injection  The medications used were depomedrol 40 mg and 1% lidocaine 3 cc Anesthesia was provided by ethyl chloride and the skin was prepped with alcohol.  After cleaning the skin with alcohol a 20-gauge needle was used to inject the right knee joint. There were no complications. A sterile bandage was applied.

## 2022-12-30 ENCOUNTER — Encounter (HOSPITAL_COMMUNITY)
Admission: RE | Disposition: A | Discharge status: Home / Self Care | Payer: Self-pay | Source: Ambulatory Visit | Attending: Optometry

## 2022-12-30 ENCOUNTER — Ambulatory Visit (HOSPITAL_COMMUNITY)
Admission: RE | Admit: 2022-12-30 | Discharge: 2022-12-30 | Disposition: A | Discharge status: Home / Self Care | Payer: BC Managed Care – PPO | Source: Ambulatory Visit | Attending: Optometry | Admitting: Optometry

## 2022-12-30 ENCOUNTER — Encounter (HOSPITAL_COMMUNITY): Payer: Self-pay | Admitting: Optometry

## 2022-12-30 ENCOUNTER — Ambulatory Visit (HOSPITAL_COMMUNITY): Payer: BC Managed Care – PPO | Admitting: Anesthesiology

## 2022-12-30 DIAGNOSIS — I509 Heart failure, unspecified: Secondary | ICD-10-CM | POA: Diagnosis not present

## 2022-12-30 DIAGNOSIS — I110 Hypertensive heart disease with heart failure: Secondary | ICD-10-CM | POA: Diagnosis not present

## 2022-12-30 DIAGNOSIS — E1136 Type 2 diabetes mellitus with diabetic cataract: Secondary | ICD-10-CM | POA: Insufficient documentation

## 2022-12-30 DIAGNOSIS — Z87891 Personal history of nicotine dependence: Secondary | ICD-10-CM | POA: Insufficient documentation

## 2022-12-30 DIAGNOSIS — H5201 Hypermetropia, right eye: Secondary | ICD-10-CM | POA: Diagnosis not present

## 2022-12-30 DIAGNOSIS — H25811 Combined forms of age-related cataract, right eye: Secondary | ICD-10-CM

## 2022-12-30 DIAGNOSIS — Z961 Presence of intraocular lens: Secondary | ICD-10-CM | POA: Diagnosis not present

## 2022-12-30 DIAGNOSIS — H524 Presbyopia: Secondary | ICD-10-CM | POA: Diagnosis not present

## 2022-12-30 DIAGNOSIS — Z7984 Long term (current) use of oral hypoglycemic drugs: Secondary | ICD-10-CM | POA: Insufficient documentation

## 2022-12-30 DIAGNOSIS — Z6841 Body Mass Index (BMI) 40.0 and over, adult: Secondary | ICD-10-CM | POA: Diagnosis not present

## 2022-12-30 DIAGNOSIS — H2511 Age-related nuclear cataract, right eye: Secondary | ICD-10-CM | POA: Insufficient documentation

## 2022-12-30 HISTORY — PX: CATARACT EXTRACTION W/PHACO: SHX586

## 2022-12-30 LAB — GLUCOSE, CAPILLARY: Glucose-Capillary: 134 mg/dL — ABNORMAL HIGH (ref 70–99)

## 2022-12-30 SURGERY — PHACOEMULSIFICATION, CATARACT, WITH IOL INSERTION
Anesthesia: Monitor Anesthesia Care | Site: Eye | Laterality: Right

## 2022-12-30 MED ORDER — FENTANYL CITRATE (PF) 100 MCG/2ML IJ SOLN
INTRAMUSCULAR | Status: AC
  Filled 2022-12-30: qty 2

## 2022-12-30 MED ORDER — SIGHTPATH DOSE#1 NA HYALUR & NA CHOND-NA HYALUR IO KIT
PACK | INTRAOCULAR | Status: DC | PRN
  Administered 2022-12-30: 1 via OPHTHALMIC

## 2022-12-30 MED ORDER — POVIDONE-IODINE 5 % OP SOLN
OPHTHALMIC | Status: DC | PRN
  Administered 2022-12-30: 1 via OPHTHALMIC

## 2022-12-30 MED ORDER — LIDOCAINE HCL 3.5 % OP GEL
1.0000 | Freq: Once | OPHTHALMIC | Status: AC
  Administered 2022-12-30: 1 via OPHTHALMIC

## 2022-12-30 MED ORDER — BSS IO SOLN
INTRAOCULAR | Status: DC | PRN
  Administered 2022-12-30: 15 mL via INTRAOCULAR

## 2022-12-30 MED ORDER — MIDAZOLAM HCL 2 MG/2ML IJ SOLN
INTRAMUSCULAR | Status: AC
  Filled 2022-12-30: qty 2

## 2022-12-30 MED ORDER — STERILE WATER FOR IRRIGATION IR SOLN
Status: DC | PRN
  Administered 2022-12-30: 250 mL

## 2022-12-30 MED ORDER — TETRACAINE HCL 0.5 % OP SOLN
1.0000 [drp] | OPHTHALMIC | Status: AC
  Administered 2022-12-30 (×3): 1 [drp] via OPHTHALMIC

## 2022-12-30 MED ORDER — MIDAZOLAM HCL 5 MG/5ML IJ SOLN
INTRAMUSCULAR | Status: DC | PRN
  Administered 2022-12-30: 1 mg via INTRAVENOUS

## 2022-12-30 MED ORDER — FENTANYL CITRATE (PF) 100 MCG/2ML IJ SOLN
INTRAMUSCULAR | Status: DC | PRN
  Administered 2022-12-30: 50 ug via INTRAVENOUS

## 2022-12-30 MED ORDER — SODIUM CHLORIDE 0.9% FLUSH
INTRAVENOUS | Status: DC | PRN
  Administered 2022-12-30: 10 mL via INTRAVENOUS

## 2022-12-30 MED ORDER — TROPICAMIDE 1 % OP SOLN
1.0000 [drp] | OPHTHALMIC | Status: AC
  Administered 2022-12-30 (×3): 1 [drp] via OPHTHALMIC

## 2022-12-30 MED ORDER — PHENYLEPHRINE HCL 2.5 % OP SOLN
1.0000 [drp] | OPHTHALMIC | Status: AC
  Administered 2022-12-30 (×3): 1 [drp] via OPHTHALMIC

## 2022-12-30 MED ORDER — LIDOCAINE HCL (PF) 1 % IJ SOLN
INTRAMUSCULAR | Status: DC | PRN
  Administered 2022-12-30: 1 mL

## 2022-12-30 MED ORDER — NEOMYCIN-POLYMYXIN-DEXAMETH 3.5-10000-0.1 OP SUSP
OPHTHALMIC | Status: DC | PRN
  Administered 2022-12-30: 1 [drp] via OPHTHALMIC

## 2022-12-30 MED ORDER — PHENYLEPHRINE-KETOROLAC 1-0.3 % IO SOLN
INTRAOCULAR | Status: DC | PRN
  Administered 2022-12-30: 500 mL via OPHTHALMIC

## 2022-12-30 SURGICAL SUPPLY — 16 items
CATARACT SUITE SIGHTPATH (MISCELLANEOUS) ×1 IMPLANT
CLOTH BEACON ORANGE TIMEOUT ST (SAFETY) ×1 IMPLANT
DRSG TEGADERM 4X4.75 (GAUZE/BANDAGES/DRESSINGS) ×1 IMPLANT
EYE SHIELD UNIVERSAL CLEAR (GAUZE/BANDAGES/DRESSINGS) IMPLANT
FEE CATARACT SUITE SIGHTPATH (MISCELLANEOUS) ×1 IMPLANT
GLOVE BIOGEL PI IND STRL 7.0 (GLOVE) ×2 IMPLANT
LENS IOL DIOP 22.5 (Intraocular Lens) ×1 IMPLANT
LENS IOL TECNIS MONO 22.5 (Intraocular Lens) IMPLANT
NDL HYPO 18GX1.5 BLUNT FILL (NEEDLE) ×1 IMPLANT
NEEDLE HYPO 18GX1.5 BLUNT FILL (NEEDLE) ×1 IMPLANT
PAD ARMBOARD 7.5X6 YLW CONV (MISCELLANEOUS) ×1 IMPLANT
RING MALYGIN 7.0 (MISCELLANEOUS) IMPLANT
SYR TB 1ML LL NO SAFETY (SYRINGE) ×1 IMPLANT
TAPE SURG TRANSPORE 1 IN (GAUZE/BANDAGES/DRESSINGS) IMPLANT
TAPE SURGICAL TRANSPORE 1 IN (GAUZE/BANDAGES/DRESSINGS) ×1
WATER STERILE IRR 250ML POUR (IV SOLUTION) ×1 IMPLANT

## 2022-12-30 NOTE — Discharge Instructions (Signed)
Please discharge patient when stable, will follow up today with Dr. Dayona Shaheen at the [facility] [place] office immediately following discharge.  Leave shield in place until visit.  All paperwork with discharge instructions will be given at the office.   [facility] Address:  [address]  [place], [place] [address]  Dr. Eulogia Dismore's Phone: [phone]  

## 2022-12-30 NOTE — Anesthesia Procedure Notes (Signed)
Procedure Name: MAC Date/Time: 12/30/2022 8:02 AM  Performed by: Maude Leriche, CRNAPre-anesthesia Checklist: Patient identified, Emergency Drugs available, Suction available, Patient being monitored and Timeout performed Patient Re-evaluated:Patient Re-evaluated prior to induction Oxygen Delivery Method: Nasal cannula Placement Confirmation: positive ETCO2

## 2022-12-30 NOTE — Progress Notes (Signed)
   12/30/22 0724  OBSTRUCTIVE SLEEP APNEA  Have you ever been diagnosed with sleep apnea through a sleep study? No  Do you snore loudly (loud enough to be heard through closed doors)?  0  Do you often feel tired, fatigued, or sleepy during the daytime (such as falling asleep during driving or talking to someone)? 0  Has anyone observed you stop breathing during your sleep? 0  Do you have, or are you being treated for high blood pressure? 1  BMI more than 35 kg/m2? 1  Age > 50 (1-yes) 1  Neck circumference greater than:Male 16 inches or larger, Male 17inches or larger? 1  Male Gender (Yes=1) 1  Obstructive Sleep Apnea Score 5

## 2022-12-30 NOTE — Anesthesia Preprocedure Evaluation (Signed)
Anesthesia Evaluation  Patient identified by MRN, date of birth, ID band Patient awake    Reviewed: Allergy & Precautions, H&P , NPO status , Patient's Chart, lab work & pertinent test results, reviewed documented beta blocker date and time   Airway Mallampati: II  TM Distance: >3 FB Neck ROM: Full    Dental  (+) Dental Advisory Given, Missing   Pulmonary shortness of breath and with exertion, pneumonia, former smoker   Pulmonary exam normal breath sounds clear to auscultation       Cardiovascular Exercise Tolerance: Good hypertension, Pt. on medications and Pt. on home beta blockers +CHF  Normal cardiovascular exam Rhythm:Regular Rate:Normal   Erma Heritage, PA-C 07/22/2019  1:06 PM EDT   Please let the patient know his echocardiogram shows the pumping function of his heart has normalized. EF was previously 40-45%, now at 55-60% which is within a normal range. The heart does relax slightly abnormal which is likely due to known hypertension so good blood pressure control is essential. Please forward a copy of results to Celene Squibb, MD.     Neuro/Psych  Neuromuscular disease  negative psych ROS   GI/Hepatic Neg liver ROS,GERD  Medicated,,  Endo/Other  diabetes, Well Controlled, Type 2, Oral Hypoglycemic Agents  Morbid obesity  Renal/GU negative Renal ROS  negative genitourinary   Musculoskeletal negative musculoskeletal ROS (+)    Abdominal   Peds negative pediatric ROS (+)  Hematology negative hematology ROS (+)   Anesthesia Other Findings   Reproductive/Obstetrics negative OB ROS                             Anesthesia Physical Anesthesia Plan  ASA: 3  Anesthesia Plan: MAC   Post-op Pain Management: Minimal or no pain anticipated   Induction:   PONV Risk Score and Plan: Treatment may vary due to age or medical condition  Airway Management Planned: Nasal Cannula and  Natural Airway  Additional Equipment:   Intra-op Plan:   Post-operative Plan:   Informed Consent: I have reviewed the patients History and Physical, chart, labs and discussed the procedure including the risks, benefits and alternatives for the proposed anesthesia with the patient or authorized representative who has indicated his/her understanding and acceptance.     Dental advisory given  Plan Discussed with: CRNA and Surgeon  Anesthesia Plan Comments:         Anesthesia Quick Evaluation

## 2022-12-30 NOTE — Interval H&P Note (Signed)
History and Physical Interval Note:  12/30/2022 7:54 AM  The H and P was reviewed and updated. The patient was examined.  No changes were found after exam.  The surgical eye was marked.  Len Azeez

## 2022-12-30 NOTE — Anesthesia Postprocedure Evaluation (Signed)
Anesthesia Post Note  Patient: Baird Kay  Procedure(s) Performed: CATARACT EXTRACTION PHACO AND INTRAOCULAR LENS PLACEMENT (IOC) (Right: Eye)  Patient location during evaluation: Phase II Anesthesia Type: MAC Level of consciousness: awake and alert and oriented Pain management: pain level controlled Vital Signs Assessment: post-procedure vital signs reviewed and stable Respiratory status: spontaneous breathing, nonlabored ventilation and respiratory function stable Cardiovascular status: stable and blood pressure returned to baseline Postop Assessment: no apparent nausea or vomiting Anesthetic complications: no  No notable events documented.   Last Vitals:  Vitals:   12/30/22 0728 12/30/22 0822  BP: 127/83 133/82  Pulse: 86 84  Resp: 14 17  Temp: 36.6 C 36.6 C  SpO2:  99%    Last Pain:  Vitals:   12/30/22 0822  TempSrc:   PainSc: 0-No pain                 Hennie Gosa C Antuan Limes

## 2022-12-30 NOTE — Transfer of Care (Signed)
Immediate Anesthesia Transfer of Care Note  Patient: Roberto Guerra  Procedure(s) Performed: CATARACT EXTRACTION PHACO AND INTRAOCULAR LENS PLACEMENT (IOC) (Right: Eye)  Patient Location: PACU  Anesthesia Type:MAC  Level of Consciousness: awake, alert , and oriented  Airway & Oxygen Therapy: Patient Spontanous Breathing  Post-op Assessment: Report given to RN, Post -op Vital signs reviewed and stable, Patient moving all extremities X 4, and Patient able to stick tongue midline  Post vital signs: Reviewed  Last Vitals:  Vitals Value Taken Time  BP 133/82   Temp 97.6   Pulse 79   Resp 21   SpO2 98     Last Pain:  Vitals:   12/30/22 0728  TempSrc: Oral         Complications: No notable events documented.

## 2022-12-30 NOTE — Op Note (Addendum)
Date of procedure: 12/30/22  Pre-operative diagnosis: Visually significant age-related nuclear cataract, Right Eye (H25.11)  Post-operative diagnosis: Visually significant age-related nuclear cataract, Right Eye  Procedure: Removal of cataract via phacoemulsification and insertion of intra-ocular lens J&J Tecnis IOL +22.5D into the capsular bag of the Right Eye  Attending surgeon: Ronn Melena, MD  Anesthesia: MAC, Topical Akten  Complications: None  Estimated Blood Loss: <46m (minimal)  Specimens: None  Implants:  Implant Name Type Inv. Item Serial No. Manufacturer Lot No. LRB No. Used Action  TECNIS IOL Intraocular Lens  [id number]  Right 1 Implanted    Indications:  Visually significant age-related cataract, Right Eye  Procedure:  The patient was seen and identified in the pre-operative area. The operative eye was identified and dilated.  The operative eye was marked.  Topical anesthesia was administered to the operative eye.     The patient was then to the operative suite and placed in the supine position.  A timeout was performed confirming the patient, procedure to be performed, and all other relevant information.   The patient's face was prepped and draped in the usual fashion for intra-ocular surgery.  A lid speculum was placed into the operative eye and the surgical microscope moved into place and focused.  A superotemporal paracentesis was created using a 20 gauge paracentesis blade.  BSS mixed with Omidria, followed by 1% lidocaine was injected into the anterior chamber.  Viscoelastic was injected into the anterior chamber.  A temporal clear-corneal main wound incision was created using a 2.442mmicrokeratome.  A continuous curvilinear capsulorrhexis was initiated using an irrigating cystitome and completed using capsulorrhexis forceps.  Hydrodissection and hydrodeliniation were performed.  Viscoelastic was injected into the anterior chamber.  A  phacoemulsification handpiece and a chopper as a second instrument were used to remove the nucleus and epinucleus. The irrigation/aspiration handpiece was used to remove any remaining cortical material.   The capsular bag was reinflated with viscoelastic, checked, and found to be intact.  The intraocular lens was inserted into the capsular bag.  The irrigation/aspiration handpiece was used to remove any remaining viscoelastic.  The clear corneal wound and paracentesis wounds were then hydrated and checked with Weck-Cels to be watertight.  The lid-speculum and drape was removed, and the patient's face was cleaned with a wet and dry 4x4. One drop of maxitrol was placed on the ocular surface. A clear shield was taped over the eye. The patient was taken to the post-operative care unit in good condition, having tolerated the procedure well.  Post-Op Instructions: [facility] and will receive all other orders and instructions.

## 2023-01-05 ENCOUNTER — Encounter: Payer: Self-pay | Admitting: Radiology

## 2023-01-06 ENCOUNTER — Encounter (HOSPITAL_COMMUNITY): Payer: Self-pay | Admitting: Optometry

## 2023-01-19 DIAGNOSIS — E119 Type 2 diabetes mellitus without complications: Secondary | ICD-10-CM | POA: Diagnosis not present

## 2023-01-19 DIAGNOSIS — E782 Mixed hyperlipidemia: Secondary | ICD-10-CM | POA: Diagnosis not present

## 2023-01-19 DIAGNOSIS — I10 Essential (primary) hypertension: Secondary | ICD-10-CM | POA: Diagnosis not present

## 2023-01-25 DIAGNOSIS — I10 Essential (primary) hypertension: Secondary | ICD-10-CM | POA: Diagnosis not present

## 2023-01-25 DIAGNOSIS — E1165 Type 2 diabetes mellitus with hyperglycemia: Secondary | ICD-10-CM | POA: Diagnosis not present

## 2023-01-25 DIAGNOSIS — I110 Hypertensive heart disease with heart failure: Secondary | ICD-10-CM | POA: Diagnosis not present

## 2023-01-25 DIAGNOSIS — I5022 Chronic systolic (congestive) heart failure: Secondary | ICD-10-CM | POA: Diagnosis not present

## 2023-01-25 DIAGNOSIS — E782 Mixed hyperlipidemia: Secondary | ICD-10-CM | POA: Diagnosis not present

## 2023-02-07 DIAGNOSIS — H524 Presbyopia: Secondary | ICD-10-CM | POA: Diagnosis not present

## 2023-03-20 ENCOUNTER — Emergency Department (HOSPITAL_COMMUNITY): Payer: BC Managed Care – PPO

## 2023-03-20 ENCOUNTER — Emergency Department (HOSPITAL_COMMUNITY)
Admission: EM | Admit: 2023-03-20 | Discharge: 2023-03-20 | Disposition: A | Discharge status: Home / Self Care | Payer: BC Managed Care – PPO | Attending: Emergency Medicine | Admitting: Emergency Medicine

## 2023-03-20 ENCOUNTER — Encounter (HOSPITAL_COMMUNITY): Payer: Self-pay

## 2023-03-20 DIAGNOSIS — S8391XA Sprain of unspecified site of right knee, initial encounter: Secondary | ICD-10-CM | POA: Insufficient documentation

## 2023-03-20 DIAGNOSIS — X501XXA Overexertion from prolonged static or awkward postures, initial encounter: Secondary | ICD-10-CM | POA: Insufficient documentation

## 2023-03-20 DIAGNOSIS — M25561 Pain in right knee: Secondary | ICD-10-CM | POA: Diagnosis not present

## 2023-03-20 NOTE — ED Triage Notes (Signed)
POV/ fall yesterday/ injuring right knee/ difficulty walking/ 8/10 pain

## 2023-03-20 NOTE — ED Provider Notes (Signed)
[place] EMERGENCY DEPARTMENT AT [facility]  Provider Note  CSN: [id number] Arrival date & time: 03/20/23 4098  History Chief Complaint  Patient presents with   Knee Injury    Roberto Guerra is a 62 y.o. male with history of chronic R knee pain, has been told he needed TKR, reports he twisted his knee and then fell yesterday around 1100hrs. He has been resting and icing it in the meantime but still having pain this morning so he decided to come to the ED for evaluation. No other injuries.    Home Medications Prior to Admission medications   Medication Sig Start Date End Date Taking? Authorizing Provider  allopurinol (ZYLOPRIM) 300 MG tablet Take 300 mg by mouth daily.    [provider]  amLODipine (NORVASC) 10 MG tablet Take 10 mg by mouth daily. 12/14/22   [provider]  carvedilol (COREG) 12.5 MG tablet TAKE 1 TABLET BY MOUTH TWICE DAILY WITH A MEAL 03/26/21   Antoine Poche, MD  furosemide (LASIX) 20 MG tablet Take 1 tablet (20 mg total) by mouth daily. 04/19/22   Antoine Poche, MD  gabapentin (NEURONTIN) 300 MG capsule Take 300 mg by mouth 3 (three) times daily.     [provider]  HYDROcodone-acetaminophen (NORCO/VICODIN) 5-325 MG tablet Take 1 tablet by mouth every 6 (six) hours as needed for severe pain.    [provider]  meclizine (ANTIVERT) 25 MG tablet Take 1 tablet (25 mg total) by mouth 3 (three) times daily as needed for dizziness. 03/13/20   Gilda Crease, MD  metFORMIN (GLUCOPHAGE) 1000 MG tablet Take 1,000 mg by mouth 2 (two) times daily.  Patient not taking: Reported on 04/19/2022    [provider]  MOUNJARO 12.5 MG/0.5ML Pen SMARTSIG:12.5 Milligram(s) SUB-Q Once a Week 11/21/22   [provider]  naproxen (NAPROSYN) 500 MG tablet Take 1 tablet (500 mg total) by mouth 2 (two) times daily. 06/12/22   Sabas Sous, MD  nitroGLYCERIN (NITROSTAT) 0.4 MG SL tablet Place 1 tablet (0.4 mg  total) under the tongue every 5 (five) minutes as needed for chest pain (CP or SOB). 11/05/20 04/20/23  Strader, Lennart Pall, PA-C  olmesartan (BENICAR) 40 MG tablet Take 1 tablet (40 mg total) by mouth daily. 11/05/20   Strader, Lennart Pall, PA-C  Omega-3 Fatty Acids (FISH OIL) 1000 MG CAPS Take 1,000 mg by mouth daily.    [provider]  potassium chloride (KLOR-CON) 10 MEQ tablet Take 1 tablet by mouth once daily 05/06/20   Antoine Poche, MD  pravastatin (PRAVACHOL) 40 MG tablet Take 40 mg by mouth every morning.     [provider]  tiZANidine (ZANAFLEX) 4 MG capsule Take 1 capsule (4 mg total) by mouth 3 (three) times daily as needed for muscle spasms. Do not drink alcohol or drive while taking this medication.  May cause drowsiness. 08/14/22   Particia Nearing, PA-C     Allergies    Patient has no known allergies.   Review of Systems   Review of Systems Please see HPI for pertinent positives and negatives  Physical Exam BP (!) 153/90 (BP Location: Left Arm)   Pulse 93   Temp 97.9 F (36.6 C) (Oral)   Resp 15   Wt 130.2 kg   SpO2 98%   BMI 49.26 kg/m   Physical Exam Vitals and nursing note reviewed.  HENT:     Head: Normocephalic.  Nose: Nose normal.  Eyes:     Extraocular Movements: Extraocular movements intact.  Cardiovascular:     Pulses: Normal pulses.  Pulmonary:     Effort: Pulmonary effort is normal.  Musculoskeletal:        General: Tenderness (antero-lateral R knee) present. No swelling or deformity. Normal range of motion.     Cervical back: Neck supple.  Skin:    Findings: No rash (on exposed skin).  Neurological:     Mental Status: He is alert and oriented to person, place, and time.  Psychiatric:        Mood and Affect: Mood normal.     ED Results / Procedures / Treatments   EKG None  Procedures Procedures  Medications Ordered in the ED Medications - No data to display  Initial Impression and Plan  Patient  here with exacerbation of chronic R knee pain after a fall yesterday morning. Has Norco at home for pain but not helping much today. Will check Xray, if neg anticipate discharge with knee brace and Ortho follow up.   ED Course   Clinical Course as of 03/20/23 0654  Mon Mar 20, 2023  1610 I personally viewed the images from radiology studies and agree with radiologist interpretation: Xray with arthritis but no acute fracture. Will provide hinged knee brace, recommend he stay off of it as much as possible. Follow up with Ortho, RTED for any other concerns.  [CS]    Clinical Course User Index [CS] Pollyann Savoy, MD     MDM Rules/Calculators/A&P Medical Decision Making Problems Addressed: Sprain of right knee, unspecified ligament, initial encounter: acute illness or injury  Amount and/or Complexity of Data Reviewed Radiology: ordered and independent interpretation performed. Decision-making details documented in ED Course.  Risk Prescription drug management.     Final Clinical Impression(s) / ED Diagnoses Final diagnoses:  Sprain of right knee, unspecified ligament, initial encounter    Rx / DC Orders ED Discharge Orders     None        Pollyann Savoy, MD 03/20/23 [id number]

## 2023-03-20 NOTE — ED Notes (Signed)
Pt right knee ace wrapped instead of hinged knee brace , EDP aware.

## 2023-03-27 ENCOUNTER — Encounter: Payer: Self-pay | Admitting: Orthopedic Surgery

## 2023-03-27 ENCOUNTER — Ambulatory Visit (INDEPENDENT_AMBULATORY_CARE_PROVIDER_SITE_OTHER): Payer: BC Managed Care – PPO | Admitting: Orthopedic Surgery

## 2023-03-27 VITALS — BP 147/84 | HR 79 | Ht 66.0 in | Wt 291.0 lb

## 2023-03-27 DIAGNOSIS — S8001XA Contusion of right knee, initial encounter: Secondary | ICD-10-CM

## 2023-03-28 NOTE — Progress Notes (Signed)
   Chief Complaint  Patient presents with   Knee Injury    Follow up for injury/ pain  in rt knee //DOI 03/19/23 went to ER on 03/20/23. Xrays in Epic    HPI: 62 year old male fell at church when he twisted looking for something in the choir stand.  He landed on his right knee.  He went to the ER for evaluation he was found to have a contusion of his right knee that was no fracture or dislocation no change in the condition of his knee.  He is undergoing treatment for osteoarthritis of the right knee hoping to get his BMI in the 40 range so he can have a right total knee arthroplasty  He indicates that he is improving  Past Medical History:  Diagnosis Date   CHF (congestive heart failure) (HCC)    a. EF 40-45% in 03/2019 with NST showing no reversible ischemia   Diabetes mellitus without complication (HCC)    Gout    Hyperlipidemia    Hypertension    PNA (pneumonia)     BP (!) 147/84   Pulse 79   Ht 5\' 6"  (1.676 m)   Wt 291 lb (132 kg)   BMI 46.97 kg/m    General appearance: Well-developed well-nourished no gross deformities  Cardiovascular normal pulse and perfusion normal color without edema  Neurologically no sensation loss or deficits or pathologic reflexes  Psychological: Awake alert and oriented x3 mood and affect normal  Skin no lacerations or ulcerations no nodularity no palpable masses, no erythema or nodularity  Musculoskeletal: Examination of the right knee shows a bruise over the medial aspect of the proximal tibia just next the tibial tubercle.  His range of motion has not changed from previous examinations and it is limited in flexion but not extension.  The knee is stable in all planes.  Imaging plain films show osteoarthritis of the knee these were done at [facility] that is my interpretation of the x-ray  A/P  Contusion right knee  Recommend active range of motion, ice to control swelling  Follow-up in 2 weeks to determine if any other  interventions need to be done at this time

## 2023-04-10 ENCOUNTER — Encounter: Payer: Self-pay | Admitting: Orthopedic Surgery

## 2023-04-10 ENCOUNTER — Ambulatory Visit (INDEPENDENT_AMBULATORY_CARE_PROVIDER_SITE_OTHER): Payer: BC Managed Care – PPO | Admitting: Orthopedic Surgery

## 2023-04-10 VITALS — BP 131/80 | HR 97 | Ht 66.0 in | Wt 291.0 lb

## 2023-04-10 DIAGNOSIS — S8001XD Contusion of right knee, subsequent encounter: Secondary | ICD-10-CM

## 2023-04-10 DIAGNOSIS — M1711 Unilateral primary osteoarthritis, right knee: Secondary | ICD-10-CM | POA: Diagnosis not present

## 2023-04-10 MED ORDER — NAPROXEN 500 MG PO TABS
500.0000 mg | ORAL_TABLET | Freq: Two times a day (BID) | ORAL | 0 refills | Status: DC
Start: 2023-04-10 — End: 2023-04-27

## 2023-04-10 NOTE — Progress Notes (Signed)
Chief Complaint  Patient presents with   Knee Pain    Right / shooting pains into foot / also has a knot at kneecap    Status post contusion right knee complains of a small knot in the subcutaneous tissue where he fell he is also having some lateral pain from his knee down to his foot  He is already on gabapentin 303 times a day is on hydrocodone  He did not continue his naproxen  He has limited flexion in his knee just past 90 degrees I would say it is about 105 degrees he has weak extension tenderness over the tibial tubercle bruise in that region is resolving  I think he just has a contusion is developed some neuritis I recommend he go back on his naproxen and see [place] in 4 weeks.  Encounter Diagnoses  Name Primary?   Contusion of right knee, subsequent encounter Yes   Localized osteoarthritis of right knee    Meds ordered this encounter  Medications   naproxen (NAPROSYN) 500 MG tablet    Sig: Take 1 tablet (500 mg total) by mouth 2 (two) times daily.    Dispense:  30 tablet    Refill:  0

## 2023-04-26 ENCOUNTER — Other Ambulatory Visit: Payer: Self-pay | Admitting: Orthopedic Surgery

## 2023-04-26 DIAGNOSIS — M1711 Unilateral primary osteoarthritis, right knee: Secondary | ICD-10-CM

## 2023-04-26 DIAGNOSIS — S8001XD Contusion of right knee, subsequent encounter: Secondary | ICD-10-CM

## 2023-05-02 DIAGNOSIS — E119 Type 2 diabetes mellitus without complications: Secondary | ICD-10-CM | POA: Diagnosis not present

## 2023-05-02 DIAGNOSIS — I10 Essential (primary) hypertension: Secondary | ICD-10-CM | POA: Diagnosis not present

## 2023-05-02 DIAGNOSIS — E782 Mixed hyperlipidemia: Secondary | ICD-10-CM | POA: Diagnosis not present

## 2023-05-06 DIAGNOSIS — I10 Essential (primary) hypertension: Secondary | ICD-10-CM | POA: Diagnosis not present

## 2023-05-06 DIAGNOSIS — E1165 Type 2 diabetes mellitus with hyperglycemia: Secondary | ICD-10-CM | POA: Diagnosis not present

## 2023-05-06 DIAGNOSIS — E782 Mixed hyperlipidemia: Secondary | ICD-10-CM | POA: Diagnosis not present

## 2023-05-06 DIAGNOSIS — I110 Hypertensive heart disease with heart failure: Secondary | ICD-10-CM | POA: Diagnosis not present

## 2023-05-06 DIAGNOSIS — I5022 Chronic systolic (congestive) heart failure: Secondary | ICD-10-CM | POA: Diagnosis not present

## 2023-05-08 ENCOUNTER — Ambulatory Visit (INDEPENDENT_AMBULATORY_CARE_PROVIDER_SITE_OTHER): Payer: BC Managed Care – PPO | Admitting: Orthopedic Surgery

## 2023-05-08 VITALS — BP 144/91 | HR 94 | Ht 65.0 in

## 2023-05-08 DIAGNOSIS — Z6841 Body Mass Index (BMI) 40.0 and over, adult: Secondary | ICD-10-CM | POA: Diagnosis not present

## 2023-05-08 DIAGNOSIS — M1710 Unilateral primary osteoarthritis, unspecified knee: Secondary | ICD-10-CM

## 2023-05-08 DIAGNOSIS — S8001XD Contusion of right knee, subsequent encounter: Secondary | ICD-10-CM

## 2023-05-08 NOTE — Progress Notes (Signed)
Chief Complaint  Patient presents with   Knee Pain    Right-Feeling better but when I am walking feels like it is going to kick out from under me   BP (!) 144/91   Pulse 94   Ht 5\' 5"  (1.651 m)   BMI 48.42 kg/m   Encounter Diagnoses  Name Primary?   Body mass index 45.0-49.9, adult (HCC) Yes   Contusion of right knee, subsequent encounter    Primary localized osteoarthritis of knee     Roberto Guerra comes in now with improved symptoms  He does have underlying osteoarthritis and had a contusion of his right knee which seems to have resolved  He needs to lose some weight probably in the neighborhood of 50 or 60 pounds  I told him he needs a knee replacement when he can lose that weight  He will come back and see [place] once he is more in line with the proper BMI for replacement

## 2023-05-19 DIAGNOSIS — J029 Acute pharyngitis, unspecified: Secondary | ICD-10-CM | POA: Diagnosis not present

## 2023-06-11 ENCOUNTER — Other Ambulatory Visit: Payer: Self-pay | Admitting: Orthopedic Surgery

## 2023-06-11 DIAGNOSIS — S8001XD Contusion of right knee, subsequent encounter: Secondary | ICD-10-CM

## 2023-06-11 DIAGNOSIS — M1711 Unilateral primary osteoarthritis, right knee: Secondary | ICD-10-CM

## 2023-07-06 ENCOUNTER — Other Ambulatory Visit: Payer: Self-pay | Admitting: Cardiology

## 2023-07-06 ENCOUNTER — Other Ambulatory Visit: Payer: Self-pay | Admitting: Orthopedic Surgery

## 2023-07-06 DIAGNOSIS — S8001XD Contusion of right knee, subsequent encounter: Secondary | ICD-10-CM

## 2023-07-06 DIAGNOSIS — M1711 Unilateral primary osteoarthritis, right knee: Secondary | ICD-10-CM

## 2023-07-31 ENCOUNTER — Other Ambulatory Visit: Payer: Self-pay | Admitting: Orthopedic Surgery

## 2023-07-31 DIAGNOSIS — S8001XD Contusion of right knee, subsequent encounter: Secondary | ICD-10-CM

## 2023-07-31 DIAGNOSIS — M1711 Unilateral primary osteoarthritis, right knee: Secondary | ICD-10-CM

## 2023-08-15 ENCOUNTER — Other Ambulatory Visit: Payer: Self-pay | Admitting: Cardiology

## 2023-08-22 ENCOUNTER — Other Ambulatory Visit: Payer: Self-pay | Admitting: Orthopedic Surgery

## 2023-08-22 DIAGNOSIS — S8001XD Contusion of right knee, subsequent encounter: Secondary | ICD-10-CM

## 2023-08-22 DIAGNOSIS — M1711 Unilateral primary osteoarthritis, right knee: Secondary | ICD-10-CM

## 2023-09-06 DIAGNOSIS — E119 Type 2 diabetes mellitus without complications: Secondary | ICD-10-CM | POA: Diagnosis not present

## 2023-09-06 DIAGNOSIS — I10 Essential (primary) hypertension: Secondary | ICD-10-CM | POA: Diagnosis not present

## 2023-09-14 ENCOUNTER — Other Ambulatory Visit: Payer: Self-pay | Admitting: Orthopedic Surgery

## 2023-09-14 DIAGNOSIS — S8001XD Contusion of right knee, subsequent encounter: Secondary | ICD-10-CM

## 2023-09-14 DIAGNOSIS — M1711 Unilateral primary osteoarthritis, right knee: Secondary | ICD-10-CM

## 2023-09-18 DIAGNOSIS — I110 Hypertensive heart disease with heart failure: Secondary | ICD-10-CM | POA: Diagnosis not present

## 2023-09-18 DIAGNOSIS — I5022 Chronic systolic (congestive) heart failure: Secondary | ICD-10-CM | POA: Diagnosis not present

## 2023-09-18 DIAGNOSIS — Z0000 Encounter for general adult medical examination without abnormal findings: Secondary | ICD-10-CM | POA: Diagnosis not present

## 2023-09-18 DIAGNOSIS — I10 Essential (primary) hypertension: Secondary | ICD-10-CM | POA: Diagnosis not present

## 2023-09-18 DIAGNOSIS — E782 Mixed hyperlipidemia: Secondary | ICD-10-CM | POA: Diagnosis not present

## 2023-09-18 DIAGNOSIS — E1165 Type 2 diabetes mellitus with hyperglycemia: Secondary | ICD-10-CM | POA: Diagnosis not present

## 2023-09-23 ENCOUNTER — Encounter (HOSPITAL_COMMUNITY): Payer: Self-pay

## 2023-09-23 ENCOUNTER — Other Ambulatory Visit: Payer: Self-pay

## 2023-09-23 ENCOUNTER — Emergency Department (HOSPITAL_COMMUNITY): Payer: BC Managed Care – PPO

## 2023-09-23 ENCOUNTER — Inpatient Hospital Stay (HOSPITAL_COMMUNITY)
Admission: EM | Admit: 2023-09-23 | Discharge: 2023-09-26 | DRG: 155 | Disposition: A | Discharge status: Home / Self Care | Payer: BC Managed Care – PPO | Attending: Internal Medicine | Admitting: Internal Medicine

## 2023-09-23 DIAGNOSIS — I5022 Chronic systolic (congestive) heart failure: Secondary | ICD-10-CM | POA: Diagnosis present

## 2023-09-23 DIAGNOSIS — I5020 Unspecified systolic (congestive) heart failure: Secondary | ICD-10-CM | POA: Insufficient documentation

## 2023-09-23 DIAGNOSIS — L03211 Cellulitis of face: Secondary | ICD-10-CM | POA: Diagnosis not present

## 2023-09-23 DIAGNOSIS — Z7984 Long term (current) use of oral hypoglycemic drugs: Secondary | ICD-10-CM

## 2023-09-23 DIAGNOSIS — L0291 Cutaneous abscess, unspecified: Secondary | ICD-10-CM | POA: Insufficient documentation

## 2023-09-23 DIAGNOSIS — H60502 Unspecified acute noninfective otitis externa, left ear: Secondary | ICD-10-CM | POA: Diagnosis not present

## 2023-09-23 DIAGNOSIS — R609 Edema, unspecified: Secondary | ICD-10-CM | POA: Diagnosis not present

## 2023-09-23 DIAGNOSIS — Z6841 Body Mass Index (BMI) 40.0 and over, adult: Secondary | ICD-10-CM

## 2023-09-23 DIAGNOSIS — I110 Hypertensive heart disease with heart failure: Secondary | ICD-10-CM | POA: Diagnosis present

## 2023-09-23 DIAGNOSIS — Z87891 Personal history of nicotine dependence: Secondary | ICD-10-CM

## 2023-09-23 DIAGNOSIS — E782 Mixed hyperlipidemia: Secondary | ICD-10-CM | POA: Diagnosis not present

## 2023-09-23 DIAGNOSIS — Z833 Family history of diabetes mellitus: Secondary | ICD-10-CM

## 2023-09-23 DIAGNOSIS — H6092 Unspecified otitis externa, left ear: Secondary | ICD-10-CM | POA: Diagnosis not present

## 2023-09-23 DIAGNOSIS — Z79899 Other long term (current) drug therapy: Secondary | ICD-10-CM | POA: Diagnosis not present

## 2023-09-23 DIAGNOSIS — E1165 Type 2 diabetes mellitus with hyperglycemia: Secondary | ICD-10-CM | POA: Insufficient documentation

## 2023-09-23 DIAGNOSIS — I10 Essential (primary) hypertension: Secondary | ICD-10-CM | POA: Diagnosis present

## 2023-09-23 DIAGNOSIS — M109 Gout, unspecified: Secondary | ICD-10-CM | POA: Diagnosis not present

## 2023-09-23 DIAGNOSIS — H6012 Cellulitis of left external ear: Secondary | ICD-10-CM | POA: Diagnosis not present

## 2023-09-23 LAB — CBC
HCT: 39.4 % (ref 39.0–52.0)
Hemoglobin: 12.9 g/dL — ABNORMAL LOW (ref 13.0–17.0)
MCH: 31.4 pg (ref 26.0–34.0)
MCHC: 32.7 g/dL (ref 30.0–36.0)
MCV: 95.9 fL (ref 80.0–100.0)
Platelets: 176 10*3/uL (ref 150–400)
RBC: 4.11 MIL/uL — ABNORMAL LOW (ref 4.22–5.81)
RDW: 13.2 % (ref 11.5–15.5)
WBC: 10.9 10*3/uL — ABNORMAL HIGH (ref 4.0–10.5)
nRBC: 0 % (ref 0.0–0.2)

## 2023-09-23 LAB — BASIC METABOLIC PANEL
Anion gap: 8 (ref 5–15)
BUN: 14 mg/dL (ref 8–23)
CO2: 24 mmol/L (ref 22–32)
Calcium: 8.9 mg/dL (ref 8.9–10.3)
Chloride: 105 mmol/L (ref 98–111)
Creatinine, Ser: 1.21 mg/dL (ref 0.61–1.24)
GFR, Estimated: >60 mL/min (ref >60)
Glucose, Bld: 142 mg/dL — ABNORMAL HIGH (ref 70–99)
Potassium: 3.7 mmol/L (ref 3.5–5.1)
Sodium: 137 mmol/L (ref 135–145)

## 2023-09-23 MED ORDER — SODIUM CHLORIDE 0.9 % IV SOLN
2.0000 g | Freq: Once | INTRAVENOUS | Status: AC
  Administered 2023-09-23: 2 g via INTRAVENOUS
  Filled 2023-09-23: qty 12.5

## 2023-09-23 MED ORDER — VANCOMYCIN HCL 2000 MG/400ML IV SOLN
2000.0000 mg | Freq: Once | INTRAVENOUS | Status: AC
  Administered 2023-09-24: 2000 mg via INTRAVENOUS
  Filled 2023-09-23: qty 400

## 2023-09-23 MED ORDER — IOHEXOL 300 MG/ML  SOLN
75.0000 mL | Freq: Once | INTRAMUSCULAR | Status: AC | PRN
  Administered 2023-09-23: 75 mL via INTRAVENOUS

## 2023-09-23 NOTE — H&P (Signed)
History and Physical    Patient: Roberto Guerra NWG:[id number] DOB: 1961/09/28 DOA: 09/23/2023 DOS: the patient was seen and examined on 09/24/2023 PCP: Benita Stabile, MD  Patient coming from: Home  Chief Complaint:  Chief Complaint  Patient presents with   Otalgia   HPI: Roberto Guerra is a 62 y.o. male with medical history significant of hypertension, hyperlipidemia, HFrEF, T2DM who presents to the emergency department due to about 10-day onset of pain and swelling around left ear.  He went to his PCP on Monday (11/11) and was prescribed with cephalexin, but patient has been having worsening pain and swelling and decided to go to the ED for further evaluation and management today.  Patient denies fever, chills, blurry vision, nausea, vomiting.  ED Course:  In the emergency department, patient was hemodynamically stable.  Workup in the ED showed normal CBC except for WBC of 10.9 and hemoglobin of 12.9, BMP was normal except for blood glucose of 142. CT maxillofacial with contrast showed findings suggestive of acute left-sided otitis externa with regional cellulitis involving the adjacent left face.  Superimposed 1.7 x 1.4 x 2.5 cm phlegmon and probable early abscess formation within the left preauricular soft tissues.  No other drainable fluid collections identified.  No evidence for concomitant otomastoiditis.  Review of Systems: Review of systems as noted in the HPI. All other systems reviewed and are negative.   Past Medical History:  Diagnosis Date   CHF (congestive heart failure) (HCC)    a. EF 40-45% in 03/2019 with NST showing no reversible ischemia   Diabetes mellitus without complication (HCC)    Gout    Hyperlipidemia    Hypertension    PNA (pneumonia)    Past Surgical History:  Procedure Laterality Date   CATARACT EXTRACTION W/PHACO Left 10/29/2018   Procedure: CATARACT EXTRACTION PHACO AND INTRAOCULAR LENS PLACEMENT (IOC);  Surgeon: Gemma Payor, MD;  Location: [facility]  ORS;  Service: Ophthalmology;  Laterality: Left;  CDE: 43.37   CATARACT EXTRACTION W/PHACO Right 12/30/2022   Procedure: CATARACT EXTRACTION PHACO AND INTRAOCULAR LENS PLACEMENT (IOC);  Surgeon: Pecolia Ades, MD;  Location: [facility] ORS;  Service: Ophthalmology;  Laterality: Right;  CDE: 3.17   COLONOSCOPY N/A 02/27/2014   Procedure: COLONOSCOPY;  Surgeon: Malissa Hippo, MD;  Location: [facility] SUITE;  Service: Endoscopy;  Laterality: N/A;  830   NO PAST SURGERIES      Social History:  reports that he quit smoking about 24 years ago. His smoking use included cigarettes. He started smoking about 25 years ago. He has a 0.3 pack-year smoking history. He has never used smokeless tobacco. He reports that he does not drink alcohol and does not use drugs.   No Known Allergies  Family History  Problem Relation Age of Onset   Diabetes Other    Diabetes Sister    Colon cancer Neg Hx      Prior to Admission medications   Medication Sig Start Date End Date Taking? Authorizing Provider  allopurinol (ZYLOPRIM) 300 MG tablet Take 300 mg by mouth daily.    [provider]  amLODipine (NORVASC) 10 MG tablet Take 10 mg by mouth daily. 12/14/22   [provider]  carvedilol (COREG) 12.5 MG tablet TAKE 1 TABLET BY MOUTH TWICE DAILY WITH A MEAL 03/26/21   Antoine Poche, MD  furosemide (LASIX) 20 MG tablet TAKE 1 TABLET BY MOUTH ONCE DAILY . APPOINTMENT REQUIRED FOR FUTURE REFILLS 08/16/23   Antoine Poche, MD  gabapentin (NEURONTIN) 300 MG capsule Take 300 mg by mouth 3 (three) times daily.     [provider]  HYDROcodone-acetaminophen (NORCO/VICODIN) 5-325 MG tablet Take 1 tablet by mouth every 6 (six) hours as needed for severe pain.    [provider]  meclizine (ANTIVERT) 25 MG tablet Take 1 tablet (25 mg total) by mouth 3 (three) times daily as needed for dizziness. 03/13/20   Gilda Crease, MD  metFORMIN (GLUCOPHAGE) 1000 MG tablet Take 1,000 mg by mouth 2  (two) times daily.    [provider]  MOUNJARO 12.5 MG/0.5ML Pen SMARTSIG:12.5 Milligram(s) SUB-Q Once a Week 11/21/22   [provider]  naproxen (NAPROSYN) 500 MG tablet Take 1 tablet by mouth twice daily 09/15/23   Oliver Barre, MD  nitroGLYCERIN (NITROSTAT) 0.4 MG SL tablet Place 1 tablet (0.4 mg total) under the tongue every 5 (five) minutes as needed for chest pain (CP or SOB). 11/05/20 04/20/23  Strader, Lennart Pall, PA-C  olmesartan (BENICAR) 40 MG tablet Take 1 tablet (40 mg total) by mouth daily. 11/05/20   Strader, Lennart Pall, PA-C  Omega-3 Fatty Acids (FISH OIL) 1000 MG CAPS Take 1,000 mg by mouth daily.    [provider]  potassium chloride (KLOR-CON) 10 MEQ tablet Take 1 tablet by mouth once daily 05/06/20   Antoine Poche, MD  pravastatin (PRAVACHOL) 40 MG tablet Take 40 mg by mouth every morning.     [provider]  tiZANidine (ZANAFLEX) 4 MG capsule Take 1 capsule (4 mg total) by mouth 3 (three) times daily as needed for muscle spasms. Do not drink alcohol or drive while taking this medication.  May cause drowsiness. 08/14/22   Particia Nearing, PA-C    Physical Exam: BP 108/72 (BP Location: Right Arm)   Pulse 84   Temp 98.5 F (36.9 C) (Oral)   Resp 16   Ht 5\' 4"  (1.626 m)   Wt 133.3 kg   SpO2 93%   BMI 50.44 kg/m   General: 62 y.o. year-old male well developed well nourished in no acute distress.  Alert and oriented x3. HEENT: NCAT, EOMI.  Normal otoscopic examination of right ear with only impacted cerumen.  External auditory canal of the left ear was closed due to swelling and there was tenderness to pulling of the helix.  Tenderness to palpation of the periauricular tissues. Neck: Supple, trachea medial Cardiovascular: Regular rate and rhythm with no rubs or gallops.  No thyromegaly or JVD noted.  No lower extremity edema. 2/4 pulses in all 4 extremities. Respiratory: Clear to auscult.  Tenderness to palpation of the  periauricular tissues ation with no wheezes or rales. Good inspiratory effort. Abdomen: Soft, nontender nondistended with normal bowel sounds x4 quadrants. Muskuloskeletal: No cyanosis, clubbing or edema noted bilaterally Neuro: CN II-XII intact, strength 5/5 x 4, sensation, reflexes intact Skin: No ulcerative lesions noted or rashes Psychiatry: Judgement and insight appear normal. Mood is appropriate for condition and setting          Labs on Admission:  Basic Metabolic Panel: Recent Labs  Lab 09/23/23 2205  NA 137  K 3.7  CL 105  CO2 24  GLUCOSE 142*  BUN 14  CREATININE 1.21  CALCIUM 8.9   Liver Function Tests: No results for input(s): "AST", "ALT", "ALKPHOS", "BILITOT", "PROT", "ALBUMIN" in the last 168 hours. No results for input(s): "LIPASE", "AMYLASE" in the last 168 hours. No results for input(s): "AMMONIA" in the last 168 hours. CBC:  Recent Labs  Lab 09/23/23 2205  WBC 10.9*  HGB 12.9*  HCT 39.4  MCV 95.9  PLT 176   Cardiac Enzymes: No results for input(s): "CKTOTAL", "CKMB", "CKMBINDEX", "TROPONINI" in the last 168 hours.  BNP (last 3 results) No results for input(s): "BNP" in the last 8760 hours.  ProBNP (last 3 results) No results for input(s): "PROBNP" in the last 8760 hours.  CBG: No results for input(s): "GLUCAP" in the last 168 hours.  Radiological Exams on Admission: CT Maxillofacial W Contrast  Result Date: 09/23/2023 CLINICAL DATA:  Initial evaluation for abscess. EXAM: CT MAXILLOFACIAL WITH CONTRAST TECHNIQUE: Multidetector CT imaging of the maxillofacial structures was performed with intravenous contrast. Multiplanar CT image reconstructions were also generated. RADIATION DOSE REDUCTION: This exam was performed according to the departmental dose-optimization program which includes automated exposure control, adjustment of the mA and/or kV according to patient size and/or use of iterative reconstruction technique. CONTRAST:  75mL OMNIPAQUE  IOHEXOL 300 MG/ML  SOLN COMPARISON:  None Available. FINDINGS: Osseous: No acute osseous abnormality. No discrete or worrisome osseous lesions. Orbits: Globes and orbital soft tissues within normal limits. Sinuses: Paranasal sinuses are clear. Mastoid air cells and middle ear cavities are largely clear as well. Soft tissues: Soft tissue opacity within the left EAC, likely cerumen. Right auricle and tragus are thickened and edematous, compatible with acute chondritis. Swelling with hazy inflammatory stranding within the adjacent left pre and postauricular soft tissues, with inferior extension to involve the left face towards the submental region. Superimposed complex lesion with small central hypodensity positioned within the left pre-auricular soft tissues measures 1.7 x 1.4 x 2.5 cm, consistent with phlegmon and probable early abscess formation (series 2, image 24). No other drainable fluid collections identified. No evidence for concomitant otomastoiditis. Mildly prominent left upper lymph nodes, presumably reactive. Limited intracranial: Unremarkable. IMPRESSION: 1. Findings suggestive of acute left-sided otitis externa with regional cellulitis involving the adjacent left face. Superimposed 1.7 x 1.4 x 2.5 cm phlegmon and probable early abscess formation within the left pre-auricular soft tissues as above. 2. No other drainable fluid collections identified. No evidence for concomitant otomastoiditis. Electronically Signed   By: Rise Mu M.D.   On: 09/23/2023 22:54    EKG: I independently viewed the EKG done and my findings are as followed: EKG was not done in the ED  Assessment/Plan Present on Admission:  Otitis externa of left ear  Essential hypertension  Mixed hyperlipidemia  Principal Problem:   Otitis externa of left ear Active Problems:   Mixed hyperlipidemia   Essential hypertension   Cellulitis of face   Phlegmon   HFrEF (heart failure with reduced ejection fraction) (HCC)    Type 2 diabetes mellitus with hyperglycemia (HCC)   Morbid obesity with BMI of 50.0-59.9, adult (HCC)  Otitis externa of left ear with regional cellulitis of adjacent left side of face and superimposed phlegmon  CT was suggestive of regional cellulitis affecting the left side of face  Superimposed 1.7 x 1.4 x 2.5 cm phlegmon was suggested Patient was started on IV cefepime and vancomycin, we shall continue with IV ciprofloxacin and vancomycin Continue oxycodone 5 mg every 4 hours as needed for moderate/severe pain Continue on Tylenol as needed for mild pain/fever Patient may require consult/outpatient follow-up with an ENT  Probable early abscess formation CT maxillofacial was suggestive of probable early abscess formation Consider ENT consult for worsening symptoms despite above treatment  Essential hypertension Continue Coreg, Avapro  Mixed hyperlipidemia Continue Pravachol, Lovaza  History  of HFrEF Echocardiogram done in September 2020 showed an improved ejection fraction at 55 to 60% from 40 to 45% in May 2020. Continue Coreg, Avapro, Pravachol  T2DM with hyperglycemia Continue ISS and hypoglycemia protocol Patient takes Mounjaro weekly  Morbid obesity (BMI 50.44) Diet and lifestyle modification Patient takes Mounjaro weekly  DVT prophylaxis: Lovenox  Code Status: Full code  Family Communication: None at bedside  Consults: None  Severity of Illness: The appropriate patient status for this patient is INPATIENT. Inpatient status is judged to be reasonable and necessary in order to provide the required intensity of service to ensure the patient's safety. The patient's presenting symptoms, physical exam findings, and initial radiographic and laboratory data in the context of their chronic comorbidities is felt to place them at high risk for further clinical deterioration. Furthermore, it is not anticipated that the patient will be medically stable for discharge from the  hospital within 2 midnights of admission.   * I certify that at the point of admission it is my clinical judgment that the patient will require inpatient hospital care spanning beyond 2 midnights from the point of admission due to high intensity of service, high risk for further deterioration and high frequency of surveillance required.*  Author: Frankey Shown, DO 09/24/2023 6:09 AM  For on call review [email or web address].

## 2023-09-23 NOTE — ED Notes (Signed)
Patient transported to CT 

## 2023-09-23 NOTE — ED Provider Notes (Signed)
EMERGENCY DEPARTMENT AT [facility] Provider Note   CSN: [id number] Arrival date & time: 09/23/23  2137     History  Chief Complaint  Patient presents with   Otalgia    Roberto Guerra is a 62 y.o. male.   Otalgia  Patient is a 62 year old male, he has a history of hypertension, he is a diabetic and has presented with about a week and a half of pain and swelling around his left ear, he had actually been seen by his family doctor about 5 days ago at which time he was started on cephalexin, there was no eardrops, he has had progressive pain and swelling and today it is overcoming with how much swelling and pain he has.  He denies fevers or chills, denies nausea or vomiting, he has severe tenderness with any palpation or manipulation of the left ear.    Home Medications Prior to Admission medications   Medication Sig Start Date End Date Taking? Authorizing Provider  allopurinol (ZYLOPRIM) 300 MG tablet Take 300 mg by mouth daily.    [provider]  amLODipine (NORVASC) 10 MG tablet Take 10 mg by mouth daily. 12/14/22   [provider]  carvedilol (COREG) 12.5 MG tablet TAKE 1 TABLET BY MOUTH TWICE DAILY WITH A MEAL 03/26/21   Antoine Poche, MD  furosemide (LASIX) 20 MG tablet TAKE 1 TABLET BY MOUTH ONCE DAILY . APPOINTMENT REQUIRED FOR FUTURE REFILLS 08/16/23   Antoine Poche, MD  gabapentin (NEURONTIN) 300 MG capsule Take 300 mg by mouth 3 (three) times daily.     [provider]  HYDROcodone-acetaminophen (NORCO/VICODIN) 5-325 MG tablet Take 1 tablet by mouth every 6 (six) hours as needed for severe pain.    [provider]  meclizine (ANTIVERT) 25 MG tablet Take 1 tablet (25 mg total) by mouth 3 (three) times daily as needed for dizziness. 03/13/20   Gilda Crease, MD  metFORMIN (GLUCOPHAGE) 1000 MG tablet Take 1,000 mg by mouth 2 (two) times daily.    [provider]  MOUNJARO 12.5 MG/0.5ML Pen  SMARTSIG:12.5 Milligram(s) SUB-Q Once a Week 11/21/22   [provider]  naproxen (NAPROSYN) 500 MG tablet Take 1 tablet by mouth twice daily 09/15/23   Oliver Barre, MD  nitroGLYCERIN (NITROSTAT) 0.4 MG SL tablet Place 1 tablet (0.4 mg total) under the tongue every 5 (five) minutes as needed for chest pain (CP or SOB). 11/05/20 04/20/23  Strader, Lennart Pall, PA-C  olmesartan (BENICAR) 40 MG tablet Take 1 tablet (40 mg total) by mouth daily. 11/05/20   Strader, Lennart Pall, PA-C  Omega-3 Fatty Acids (FISH OIL) 1000 MG CAPS Take 1,000 mg by mouth daily.    [provider]  potassium chloride (KLOR-CON) 10 MEQ tablet Take 1 tablet by mouth once daily 05/06/20   Antoine Poche, MD  pravastatin (PRAVACHOL) 40 MG tablet Take 40 mg by mouth every morning.     [provider]  tiZANidine (ZANAFLEX) 4 MG capsule Take 1 capsule (4 mg total) by mouth 3 (three) times daily as needed for muscle spasms. Do not drink alcohol or drive while taking this medication.  May cause drowsiness. 08/14/22   Particia Nearing, PA-C      Allergies    Patient has no known allergies.    Review of Systems   Review of Systems  HENT:  Positive for ear pain.   All other systems reviewed and are negative.   Physical  Exam Updated Vital Signs BP 121/79 (BP Location: Right Arm)   Pulse (!) 108   Temp 99.3 F (37.4 C) (Oral)   Resp 16   Ht 1.651 m (5\' 5" )   Wt 131.5 kg   SpO2 95%   BMI 48.26 kg/m  Physical Exam Vitals and nursing note reviewed.  Constitutional:      General: He is not in acute distress.    Appearance: He is well-developed.  HENT:     Head: Normocephalic and atraumatic.     Ears:     Comments: This patient's right ear is normal-appearing in the external auditory canal, there is cerumen which is blocking view of the tympanic membrane.  The left ear is severely swollen, the external auditory canal is completely swollen shut in its entirety, there is tenderness in the  periauricular tissues in the circumferential area including over the mastoid although less so over the mastoid bone.  There is no trismus or torticollis but the tenderness does extend slightly down onto the neck.    Mouth/Throat:     Pharynx: No oropharyngeal exudate.  Eyes:     General: No scleral icterus.       Right eye: No discharge.        Left eye: No discharge.     Conjunctiva/sclera: Conjunctivae normal.     Pupils: Pupils are equal, round, and reactive to light.  Neck:     Thyroid: No thyromegaly.     Vascular: No JVD.  Cardiovascular:     Rate and Rhythm: Regular rhythm. Tachycardia present.     Heart sounds: Normal heart sounds. No murmur heard.    No friction rub. No gallop.     Comments: Heart rate of 105 on my exam Pulmonary:     Effort: Pulmonary effort is normal. No respiratory distress.     Breath sounds: Normal breath sounds. No wheezing or rales.  Abdominal:     General: Bowel sounds are normal. There is no distension.     Palpations: Abdomen is soft. There is no mass.     Tenderness: There is no abdominal tenderness.  Musculoskeletal:        General: No tenderness. Normal range of motion.     Cervical back: Normal range of motion and neck supple.  Lymphadenopathy:     Cervical: No cervical adenopathy.  Skin:    General: Skin is warm and dry.     Findings: No erythema or rash. Bruising: .milmdmshort. Neurological:     General: No focal deficit present.     Mental Status: He is alert.     Coordination: Coordination normal.  Psychiatric:        Behavior: Behavior normal.     ED Results / Procedures / Treatments   Labs (all labs ordered are listed, but only abnormal results are displayed) Labs Reviewed  CBC - Abnormal; Notable for the following components:      Result Value   WBC 10.9 (*)    RBC 4.11 (*)    Hemoglobin 12.9 (*)    All other components within normal limits  BASIC METABOLIC PANEL - Abnormal; Notable for the following components:    Glucose, Bld 142 (*)    All other components within normal limits    EKG None  Radiology CT Maxillofacial W Contrast  Result Date: 09/23/2023 CLINICAL DATA:  Initial evaluation for abscess. EXAM: CT MAXILLOFACIAL WITH CONTRAST TECHNIQUE: Multidetector CT imaging of the maxillofacial structures was performed with intravenous contrast. Multiplanar CT  image reconstructions were also generated. RADIATION DOSE REDUCTION: This exam was performed according to the departmental dose-optimization program which includes automated exposure control, adjustment of the mA and/or kV according to patient size and/or use of iterative reconstruction technique. CONTRAST:  75mL OMNIPAQUE IOHEXOL 300 MG/ML  SOLN COMPARISON:  None Available. FINDINGS: Osseous: No acute osseous abnormality. No discrete or worrisome osseous lesions. Orbits: Globes and orbital soft tissues within normal limits. Sinuses: Paranasal sinuses are clear. Mastoid air cells and middle ear cavities are largely clear as well. Soft tissues: Soft tissue opacity within the left EAC, likely cerumen. Right auricle and tragus are thickened and edematous, compatible with acute chondritis. Swelling with hazy inflammatory stranding within the adjacent left pre and postauricular soft tissues, with inferior extension to involve the left face towards the submental region. Superimposed complex lesion with small central hypodensity positioned within the left pre-auricular soft tissues measures 1.7 x 1.4 x 2.5 cm, consistent with phlegmon and probable early abscess formation (series 2, image 24). No other drainable fluid collections identified. No evidence for concomitant otomastoiditis. Mildly prominent left upper lymph nodes, presumably reactive. Limited intracranial: Unremarkable. IMPRESSION: 1. Findings suggestive of acute left-sided otitis externa with regional cellulitis involving the adjacent left face. Superimposed 1.7 x 1.4 x 2.5 cm phlegmon and probable early  abscess formation within the left pre-auricular soft tissues as above. 2. No other drainable fluid collections identified. No evidence for concomitant otomastoiditis. Electronically Signed   By: Rise Mu M.D.   On: 09/23/2023 22:54    Procedures Procedures    Medications Ordered in ED Medications  ceFEPIme (MAXIPIME) 2 g in sodium chloride 0.9 % 100 mL IVPB (2 g Intravenous New Bag/Given 09/23/23 2322)  vancomycin (VANCOREADY) IVPB 2000 mg/400 mL (has no administration in time range)  iohexol (OMNIPAQUE) 300 MG/ML solution 75 mL (75 mLs Intravenous Contrast Given 09/23/23 2216)    ED Course/ Medical Decision Making/ A&P                                 Medical Decision Making Amount and/or Complexity of Data Reviewed Labs: ordered. Radiology: ordered.  Risk Prescription drug management. Decision regarding hospitalization.    This patient presents to the ED for concern of ear pain and swelling differential diagnosis includes otitis externa, malignant otitis externa, mastoiditis, infection of the ear itself, deep tissue or necrotizing infection, abscess    Additional history obtained:  Additional history obtained from medical record External records from outside source obtained and reviewed including her ER visits, had been admitted to the hospital most recently for chest pain about 4 years ago, follows in the office with Dr. Margo Aye   Lab Tests:  I Ordered, and personally interpreted labs.  The pertinent results include:     Imaging Studies ordered:  I ordered imaging studies including CT scan of the neck with contrast I independently visualized and interpreted imaging which showed facial cellulitis phlegmon and possibly malignant otitis externa I agree with the radiologist interpretation   Medicines ordered and prescription drug management:  I ordered medication including cefepime for infection Reevaluation of the patient after these medicines showed that  the patient started to improve I have reviewed the patients home medicines and have made adjustments as needed   Problem List / ED Course:  The patient is a diabetic with what appears to be early malignant otitis externa, he will need to be admitted to the hospital.  I  discussed the case with Dr. Thomes Dinning of the hospitalist service who is agreeable to admit.   Social Determinants of Health:  Diabetic, non-smoker           Final Clinical Impression(s) / ED Diagnoses Final diagnoses:  Acute otitis externa of left ear, unspecified type  Facial cellulitis    Rx / DC Orders ED Discharge Orders     None         Eber Hong, MD 09/23/23 2339

## 2023-09-23 NOTE — ED Triage Notes (Signed)
Pt reports his ear has been swollen and painful x 1 week. He has been taking an abx with no improvement.

## 2023-09-24 DIAGNOSIS — L0291 Cutaneous abscess, unspecified: Secondary | ICD-10-CM | POA: Insufficient documentation

## 2023-09-24 DIAGNOSIS — H60502 Unspecified acute noninfective otitis externa, left ear: Secondary | ICD-10-CM | POA: Diagnosis not present

## 2023-09-24 DIAGNOSIS — E1165 Type 2 diabetes mellitus with hyperglycemia: Secondary | ICD-10-CM | POA: Insufficient documentation

## 2023-09-24 DIAGNOSIS — I5020 Unspecified systolic (congestive) heart failure: Secondary | ICD-10-CM | POA: Insufficient documentation

## 2023-09-24 DIAGNOSIS — L03211 Cellulitis of face: Secondary | ICD-10-CM | POA: Insufficient documentation

## 2023-09-24 LAB — GLUCOSE, CAPILLARY
Glucose-Capillary: 113 mg/dL — ABNORMAL HIGH (ref 70–99)
Glucose-Capillary: 90 mg/dL (ref 70–99)
Glucose-Capillary: 118 mg/dL — ABNORMAL HIGH (ref 70–99)
Glucose-Capillary: 158 mg/dL — ABNORMAL HIGH (ref 70–99)

## 2023-09-24 LAB — CBC
HCT: 38.6 % — ABNORMAL LOW (ref 39.0–52.0)
Hemoglobin: 12.4 g/dL — ABNORMAL LOW (ref 13.0–17.0)
MCH: 30.8 pg (ref 26.0–34.0)
MCHC: 32.1 g/dL (ref 30.0–36.0)
MCV: 96 fL (ref 80.0–100.0)
Platelets: 170 10*3/uL (ref 150–400)
RBC: 4.02 MIL/uL — ABNORMAL LOW (ref 4.22–5.81)
RDW: 13.4 % (ref 11.5–15.5)
WBC: 7.6 10*3/uL (ref 4.0–10.5)
nRBC: 0 % (ref 0.0–0.2)

## 2023-09-24 LAB — HEMOGLOBIN A1C
Hgb A1c MFr Bld: 6.1 % — ABNORMAL HIGH (ref 4.8–5.6)
Mean Plasma Glucose: 128.37 mg/dL

## 2023-09-24 LAB — HIV ANTIBODY (ROUTINE TESTING W REFLEX): HIV Screen 4th Generation wRfx: NONREACTIVE

## 2023-09-24 MED ORDER — CARVEDILOL 12.5 MG PO TABS
12.5000 mg | ORAL_TABLET | Freq: Two times a day (BID) | ORAL | Status: DC
  Administered 2023-09-24 – 2023-09-26 (×5): 12.5 mg via ORAL
  Filled 2023-09-24 (×6): qty 1

## 2023-09-24 MED ORDER — VANCOMYCIN HCL IN DEXTROSE 1-5 GM/200ML-% IV SOLN: 1000.0000 mg | Freq: Once | INTRAVENOUS | Status: DC

## 2023-09-24 MED ORDER — VANCOMYCIN HCL 1250 MG/250ML IV SOLN
1250.0000 mg | Freq: Two times a day (BID) | INTRAVENOUS | Status: DC
  Filled 2023-09-24: qty 250

## 2023-09-24 MED ORDER — VANCOMYCIN HCL 1750 MG/350ML IV SOLN
1750.0000 mg | INTRAVENOUS | Status: DC
  Administered 2023-09-24 – 2023-09-25 (×2): 1750 mg via INTRAVENOUS
  Filled 2023-09-24 (×3): qty 350

## 2023-09-24 MED ORDER — INSULIN ASPART 100 UNIT/ML IJ SOLN
0.0000 [IU] | Freq: Three times a day (TID) | INTRAMUSCULAR | Status: DC
  Administered 2023-09-26: 2 [IU] via SUBCUTANEOUS

## 2023-09-24 MED ORDER — ONDANSETRON HCL 4 MG PO TABS: 4.0000 mg | ORAL_TABLET | Freq: Four times a day (QID) | ORAL | Status: DC | PRN

## 2023-09-24 MED ORDER — IRBESARTAN 150 MG PO TABS
300.0000 mg | ORAL_TABLET | Freq: Every day | ORAL | Status: DC
  Administered 2023-09-24 – 2023-09-26 (×3): 300 mg via ORAL
  Filled 2023-09-24 (×4): qty 2

## 2023-09-24 MED ORDER — OMEGA-3-ACID ETHYL ESTERS 1 G PO CAPS
1.0000 g | ORAL_CAPSULE | Freq: Every day | ORAL | Status: DC
  Administered 2023-09-24 – 2023-09-26 (×3): 1 g via ORAL
  Filled 2023-09-24 (×3): qty 1

## 2023-09-24 MED ORDER — ENOXAPARIN SODIUM 80 MG/0.8ML IJ SOSY
65.0000 mg | PREFILLED_SYRINGE | INTRAMUSCULAR | Status: DC
  Administered 2023-09-24 – 2023-09-25 (×2): 65 mg via SUBCUTANEOUS
  Filled 2023-09-24 (×3): qty 0.8

## 2023-09-24 MED ORDER — ONDANSETRON HCL 4 MG/2ML IJ SOLN: 4.0000 mg | Freq: Four times a day (QID) | INTRAMUSCULAR | Status: DC | PRN

## 2023-09-24 MED ORDER — OXYCODONE-ACETAMINOPHEN 5-325 MG PO TABS
1.0000 | ORAL_TABLET | ORAL | Status: DC | PRN
  Administered 2023-09-24 – 2023-09-26 (×13): 1 via ORAL
  Filled 2023-09-24 (×14): qty 1

## 2023-09-24 MED ORDER — CIPROFLOXACIN IN D5W 400 MG/200ML IV SOLN
400.0000 mg | Freq: Two times a day (BID) | INTRAVENOUS | Status: DC
  Administered 2023-09-24 – 2023-09-26 (×5): 400 mg via INTRAVENOUS
  Filled 2023-09-24 (×6): qty 200

## 2023-09-24 MED ORDER — ENOXAPARIN SODIUM 40 MG/0.4ML IJ SOSY
40.0000 mg | PREFILLED_SYRINGE | INTRAMUSCULAR | Status: DC
  Filled 2023-09-24: qty 0.4

## 2023-09-24 MED ORDER — PRAVASTATIN SODIUM 40 MG PO TABS
40.0000 mg | ORAL_TABLET | Freq: Every morning | ORAL | Status: DC
  Administered 2023-09-24 – 2023-09-26 (×3): 40 mg via ORAL
  Filled 2023-09-24 (×3): qty 1

## 2023-09-24 NOTE — Plan of Care (Signed)
  Problem: Education: Goal: Knowledge of General Education information will improve Description: Including pain rating scale, medication(s)/side effects and non-pharmacologic comfort measures Outcome: Progressing   Problem: Health Behavior/Discharge Planning: Goal: Ability to manage health-related needs will improve Outcome: Progressing   Problem: Pain Management: Goal: General experience of comfort will improve Outcome: Not Progressing

## 2023-09-24 NOTE — Progress Notes (Signed)
PROGRESS NOTE    Roberto Guerra  ZOX:[id number] DOB: 08-12-1961 DOA: 09/23/2023 PCP: Benita Stabile, MD   Brief Narrative:    Roberto Guerra is a 62 y.o. male with medical history significant of hypertension, hyperlipidemia, HFrEF, T2DM who presents to the emergency department due to about 10-day onset of pain and swelling around left ear.  Patient was admitted with otitis externa to left ear with regional cellulitis and superimposed phlegmon that failed oral antibiotics.  Assessment & Plan:   Principal Problem:   Otitis externa of left ear Active Problems:   Mixed hyperlipidemia   Essential hypertension   Cellulitis of face   Phlegmon   HFrEF (heart failure with reduced ejection fraction) (HCC)   Type 2 diabetes mellitus with hyperglycemia (HCC)   Morbid obesity with BMI of 50.0-59.9, adult (HCC)  Assessment and Plan:   Otitis externa of left ear with regional cellulitis of adjacent left side of face and superimposed phlegmon  CT was suggestive of regional cellulitis affecting the left side of face  Superimposed 1.7 x 1.4 x 2.5 cm phlegmon was suggested Patient was started on IV cefepime and vancomycin, we shall continue with IV ciprofloxacin and vancomycin Continue oxycodone 5 mg every 4 hours as needed for moderate/severe pain Continue on Tylenol as needed for mild pain/fever Leukocytosis resolving, continue to follow CBC Patient may require consult/outpatient follow-up with an ENT   Probable early abscess formation CT maxillofacial was suggestive of probable early abscess formation Appears to be draining, apply warm compresses Plan to consult ENT as above if not improving further   Essential hypertension Continue Coreg, Avapro   Mixed hyperlipidemia Continue Pravachol, Lovaza   History of HFrEF Echocardiogram done in September 2020 showed an improved ejection fraction at 55 to 60% from 40 to 45% in May 2020. Continue Coreg, Avapro, Pravachol   T2DM with  hyperglycemia Continue ISS and hypoglycemia protocol Patient takes Mounjaro weekly   Morbid obesity (BMI 50.44) Diet and lifestyle modification Patient takes Mounjaro weekly    DVT prophylaxis:Lovenox Code Status: Full Family Communication: None at bedside Disposition Plan:  Status is: Inpatient Remains inpatient appropriate because: Need for IV antibiotics  Consultants:  None  Procedures:  None  Antimicrobials:  Anti-infectives (From admission, onward)    Start     Dose/Rate Route Frequency Ordered Stop   09/24/23 2200  vancomycin (VANCOREADY) IVPB 1750 mg/350 mL        1,750 mg 175 mL/hr over 120 Minutes Intravenous Every 24 hours 09/24/23 0802     09/24/23 1000  ciprofloxacin (CIPRO) IVPB 400 mg        400 mg 200 mL/hr over 60 Minutes Intravenous Every 12 hours 09/24/23 0542     09/24/23 1000  vancomycin (VANCOREADY) IVPB 1250 mg/250 mL  Status:  Discontinued        1,250 mg 166.7 mL/hr over 90 Minutes Intravenous Every 12 hours 09/24/23 0602 09/24/23 0802   09/24/23 0630  vancomycin (VANCOCIN) IVPB 1000 mg/200 mL premix  Status:  Discontinued        1,000 mg 200 mL/hr over 60 Minutes Intravenous  Once 09/24/23 0542 09/24/23 0602   09/23/23 2330  ceFEPIme (MAXIPIME) 2 g in sodium chloride 0.9 % 100 mL IVPB        2 g 200 mL/hr over 30 Minutes Intravenous  Once 09/23/23 2318 09/24/23 0011   09/23/23 2330  vancomycin (VANCOREADY) IVPB 2000 mg/400 mL        2,000 mg 200 mL/hr over  120 Minutes Intravenous  Once 09/23/23 2319 09/24/23 0305      Subjective: Patient seen and evaluated today with no new acute complaints or concerns. No acute concerns or events noted overnight.  He continues to have some left ear pain with no drainage noted.  Objective: Vitals:   09/24/23 0428 09/24/23 0814 09/24/23 0859 09/24/23 0920  BP: 108/72 112/78 (!) 88/58 104/62  Pulse: 84 93 95 92  Resp: 16  16   Temp: 98.5 F (36.9 C)  98.8 F (37.1 C)   TempSrc: Oral  Oral   SpO2: 93%   94%   Weight:      Height:        Intake/Output Summary (Last 24 hours) at 09/24/2023 1238 Last data filed at 09/24/2023 0900 Gross per 24 hour  Intake 741.18 ml  Output --  Net 741.18 ml   Filed Weights   09/23/23 2145 09/24/23 0029  Weight: 131.5 kg 133.3 kg    Examination:  General exam: Appears calm and comfortable, morbidly obese Respiratory system: Clear to auscultation. Respiratory effort normal. Cardiovascular system: S1 & S2 heard, RRR.  Gastrointestinal system: Abdomen is soft Central nervous system: Alert and awake Extremities: No edema Skin: No significant lesions noted Psychiatry: Flat affect.    Data Reviewed: I have personally reviewed following labs and imaging studies  CBC: Recent Labs  Lab 09/23/23 2205 09/24/23 1046  WBC 10.9* 7.6  HGB 12.9* 12.4*  HCT 39.4 38.6*  MCV 95.9 96.0  PLT 176 170   Basic Metabolic Panel: Recent Labs  Lab 09/23/23 2205  NA 137  K 3.7  CL 105  CO2 24  GLUCOSE 142*  BUN 14  CREATININE 1.21  CALCIUM 8.9   GFR: Estimated Creatinine Clearance: 80.5 mL/min (by C-G formula based on SCr of 1.21 mg/dL). Liver Function Tests: No results for input(s): "AST", "ALT", "ALKPHOS", "BILITOT", "PROT", "ALBUMIN" in the last 168 hours. No results for input(s): "LIPASE", "AMYLASE" in the last 168 hours. No results for input(s): "AMMONIA" in the last 168 hours. Coagulation Profile: No results for input(s): "INR", "PROTIME" in the last 168 hours. Cardiac Enzymes: No results for input(s): "CKTOTAL", "CKMB", "CKMBINDEX", "TROPONINI" in the last 168 hours. BNP (last 3 results) No results for input(s): "PROBNP" in the last 8760 hours. HbA1C: Recent Labs    09/24/23 0556  HGBA1C 6.1*   CBG: Recent Labs  Lab 09/24/23 0726 09/24/23 1133  GLUCAP 113* 90   Lipid Profile: No results for input(s): "CHOL", "HDL", "LDLCALC", "TRIG", "CHOLHDL", "LDLDIRECT" in the last 72 hours. Thyroid Function Tests: No results for  input(s): "TSH", "T4TOTAL", "FREET4", "T3FREE", "THYROIDAB" in the last 72 hours. Anemia Panel: No results for input(s): "VITAMINB12", "FOLATE", "FERRITIN", "TIBC", "IRON", "RETICCTPCT" in the last 72 hours. Sepsis Labs: No results for input(s): "PROCALCITON", "LATICACIDVEN" in the last 168 hours.  No results found for this or any previous visit (from the past 240 hour(s)).       Radiology Studies: CT Maxillofacial W Contrast  Result Date: 09/23/2023 CLINICAL DATA:  Initial evaluation for abscess. EXAM: CT MAXILLOFACIAL WITH CONTRAST TECHNIQUE: Multidetector CT imaging of the maxillofacial structures was performed with intravenous contrast. Multiplanar CT image reconstructions were also generated. RADIATION DOSE REDUCTION: This exam was performed according to the departmental dose-optimization program which includes automated exposure control, adjustment of the mA and/or kV according to patient size and/or use of iterative reconstruction technique. CONTRAST:  75mL OMNIPAQUE IOHEXOL 300 MG/ML  SOLN COMPARISON:  None Available. FINDINGS: Osseous: No acute osseous  abnormality. No discrete or worrisome osseous lesions. Orbits: Globes and orbital soft tissues within normal limits. Sinuses: Paranasal sinuses are clear. Mastoid air cells and middle ear cavities are largely clear as well. Soft tissues: Soft tissue opacity within the left EAC, likely cerumen. Right auricle and tragus are thickened and edematous, compatible with acute chondritis. Swelling with hazy inflammatory stranding within the adjacent left pre and postauricular soft tissues, with inferior extension to involve the left face towards the submental region. Superimposed complex lesion with small central hypodensity positioned within the left pre-auricular soft tissues measures 1.7 x 1.4 x 2.5 cm, consistent with phlegmon and probable early abscess formation (series 2, image 24). No other drainable fluid collections identified. No evidence for  concomitant otomastoiditis. Mildly prominent left upper lymph nodes, presumably reactive. Limited intracranial: Unremarkable. IMPRESSION: 1. Findings suggestive of acute left-sided otitis externa with regional cellulitis involving the adjacent left face. Superimposed 1.7 x 1.4 x 2.5 cm phlegmon and probable early abscess formation within the left pre-auricular soft tissues as above. 2. No other drainable fluid collections identified. No evidence for concomitant otomastoiditis. Electronically Signed   By: Rise Mu M.D.   On: 09/23/2023 22:54        Scheduled Meds:  carvedilol  12.5 mg Oral BID WC   enoxaparin (LOVENOX) injection  65 mg Subcutaneous Q24H   insulin aspart  0-15 Units Subcutaneous TID WC   irbesartan  300 mg Oral Daily   omega-3 acid ethyl esters  1 g Oral Daily   pravastatin  40 mg Oral q morning   Continuous Infusions:  ciprofloxacin 400 mg (09/24/23 0820)   vancomycin       LOS: 1 day    Time spent: 65 minutes    Angle Karel D Sherryll Burger, DO [facility]  If 7PM-7AM, please contact night-coverage [email or web address] 09/24/2023, 12:39 PM

## 2023-09-24 NOTE — Progress Notes (Signed)
Patient arrived to the unit, room 327. Alert and oriented x4. Vitals stable. Ambulated independently to the bathroom. Skin intact minus swelling noted to patient L ear. Pain 9/10. Patient in bed, call light within reach.

## 2023-09-24 NOTE — Progress Notes (Signed)
Pharmacy Antibiotic Note  Roberto Guerra is a 62 y.o. male admitted on 09/23/2023 with otitis externa/cellulitis.  Pharmacy has been consulted for Vancomycin  dosing.  Plan: Vancomycin 1250 mg IV q12h  Height: 5\' 4"  (162.6 cm) Weight: 133.3 kg (293 lb 14 oz) IBW/kg (Calculated) : 59.2  Temp (24hrs), Avg:98.6 F (37 C), Min:97.9 F (36.6 C), Max:99.3 F (37.4 C)  Recent Labs  Lab 09/23/23 2205  WBC 10.9*  CREATININE 1.21    Estimated Creatinine Clearance: 80.5 mL/min (by C-G formula based on SCr of 1.21 mg/dL).    No Known Allergies    Eddie Candle 09/24/2023 6:00 AM

## 2023-09-24 NOTE — Progress Notes (Signed)
   09/24/23 0859  Vitals  Temp 98.8 F (37.1 C)  Temp Source Oral  BP (!) 88/58  MAP (mmHg) 68  BP Location Left Arm  BP Method Automatic  Patient Position (if appropriate) Lying  Pulse Rate 95  Pulse Rate Source Dinamap  Resp 16  MEWS COLOR  MEWS Score Color Green  Oxygen Therapy  SpO2 94 %  O2 Device Room Air  MEWS Score  MEWS Temp 0  MEWS Systolic 1  MEWS Pulse 0  MEWS RR 0  MEWS LOC 0  MEWS Score 1   MD Sherryll Burger notified.

## 2023-09-24 NOTE — Progress Notes (Signed)
Patient c/o L ear pain 9/10. Adefeso, DO notified. Received PRN order for percocet. Pain med given. Pain only relieved to an 8.

## 2023-09-24 NOTE — ED Notes (Signed)
ED TO INPATIENT HANDOFF REPORT  ED Nurse Name and Phone #:   S Name/Age/Gender Roberto Guerra 62 y.o. male Room/Bed: APA14/APA14  Code Status   Code Status: Prior  Home/SNF/Other Home Patient oriented to: self, place, time, and situation Is this baseline? Yes   Triage Complete: Triage complete  Chief Complaint Otitis externa of left ear [H60.92]  Triage Note Pt reports his ear has been swollen and painful x 1 week. He has been taking an abx with no improvement.   Allergies No Known Allergies  Level of Care/Admitting Diagnosis ED Disposition     ED Disposition  Admit   Condition  --   Comment  Hospital Area: [facility] [[id number]]  Level of Care: Med-Surg [16]  Covid Evaluation: Asymptomatic - no recent exposure (last 10 days) testing not required  Diagnosis: Otitis externa of left ear [phone]  Admitting Physician: Frankey Shown [[id number]]  Attending Physician: Frankey Shown [[id number]]  Certification:: I certify this patient will need inpatient services for at least 2 midnights  Expected Medical Readiness: 09/26/2023          B Medical/Surgery History Past Medical History:  Diagnosis Date   CHF (congestive heart failure) (HCC)    a. EF 40-45% in 03/2019 with NST showing no reversible ischemia   Diabetes mellitus without complication (HCC)    Gout    Hyperlipidemia    Hypertension    PNA (pneumonia)    Past Surgical History:  Procedure Laterality Date   CATARACT EXTRACTION W/PHACO Left 10/29/2018   Procedure: CATARACT EXTRACTION PHACO AND INTRAOCULAR LENS PLACEMENT (IOC);  Surgeon: Gemma Payor, MD;  Location: [facility];  Service: Ophthalmology;  Laterality: Left;  CDE: 43.37   CATARACT EXTRACTION W/PHACO Right 12/30/2022   Procedure: CATARACT EXTRACTION PHACO AND INTRAOCULAR LENS PLACEMENT (IOC);  Surgeon: Pecolia Ades, MD;  Location: [facility];  Service: Ophthalmology;  Laterality: Right;  CDE: 3.17   COLONOSCOPY N/A 02/27/2014    Procedure: COLONOSCOPY;  Surgeon: Malissa Hippo, MD;  Location: [facility];  Service: Endoscopy;  Laterality: N/A;  830   NO PAST SURGERIES       A IV Location/Drains/Wounds Patient Lines/Drains/Airways Status     Active Line/Drains/Airways     Name Placement date Placement time Site Days   Peripheral IV 09/23/23 20 G Right Antecubital 09/23/23  2210  Antecubital  1            Intake/Output Last 24 hours No intake or output data in the 24 hours ending 09/24/23 0004  Labs/Imaging Results for orders placed or performed during the hospital encounter of 09/23/23 (from the past 48 hour(s))  CBC     Status: Abnormal   Collection Time: 09/23/23 10:05 PM  Result Value Ref Range   WBC 10.9 (H) 4.0 - 10.5 K/uL   RBC 4.11 (L) 4.22 - 5.81 MIL/uL   Hemoglobin 12.9 (L) 13.0 - 17.0 g/dL   HCT 29.5 62.1 - 30.8 %   MCV 95.9 80.0 - 100.0 fL   MCH 31.4 26.0 - 34.0 pg   MCHC 32.7 30.0 - 36.0 g/dL   RDW 65.7 84.6 - 96.2 %   Platelets 176 150 - 400 K/uL   nRBC 0.0 0.0 - 0.2 %    Comment: Performed at [facility], [address], [place], [place] [address]  Basic metabolic panel     Status: Abnormal   Collection Time: 09/23/23 10:05 PM  Result Value Ref Range   Sodium 137 135 - 145 mmol/L  Potassium 3.7 3.5 - 5.1 mmol/L   Chloride 105 98 - 111 mmol/L   CO2 24 22 - 32 mmol/L   Glucose, Bld 142 (H) 70 - 99 mg/dL    Comment: Glucose reference range applies only to samples taken after fasting for at least 8 hours.   BUN 14 8 - 23 mg/dL   Creatinine, Ser 9.81 0.61 - 1.24 mg/dL   Calcium 8.9 8.9 - 19.1 mg/dL   GFR, Estimated >47 >82 mL/min    Comment: (NOTE) Calculated using the CKD-EPI Creatinine Equation (2021)    Anion gap 8 5 - 15    Comment: Performed at [facility], [address] [place], [place] [address]   CT Maxillofacial W Contrast  Result Date: 09/23/2023 CLINICAL DATA:  Initial evaluation for abscess. EXAM: CT MAXILLOFACIAL WITH CONTRAST TECHNIQUE: Multidetector CT  imaging of the maxillofacial structures was performed with intravenous contrast. Multiplanar CT image reconstructions were also generated. RADIATION DOSE REDUCTION: This exam was performed according to the departmental dose-optimization program which includes automated exposure control, adjustment of the mA and/or kV according to patient size and/or use of iterative reconstruction technique. CONTRAST:  75mL OMNIPAQUE IOHEXOL 300 MG/ML  SOLN COMPARISON:  None Available. FINDINGS: Osseous: No acute osseous abnormality. No discrete or worrisome osseous lesions. Orbits: Globes and orbital soft tissues within normal limits. Sinuses: Paranasal sinuses are clear. Mastoid air cells and middle ear cavities are largely clear as well. Soft tissues: Soft tissue opacity within the left EAC, likely cerumen. Right auricle and tragus are thickened and edematous, compatible with acute chondritis. Swelling with hazy inflammatory stranding within the adjacent left pre and postauricular soft tissues, with inferior extension to involve the left face towards the submental region. Superimposed complex lesion with small central hypodensity positioned within the left pre-auricular soft tissues measures 1.7 x 1.4 x 2.5 cm, consistent with phlegmon and probable early abscess formation (series 2, image 24). No other drainable fluid collections identified. No evidence for concomitant otomastoiditis. Mildly prominent left upper lymph nodes, presumably reactive. Limited intracranial: Unremarkable. IMPRESSION: 1. Findings suggestive of acute left-sided otitis externa with regional cellulitis involving the adjacent left face. Superimposed 1.7 x 1.4 x 2.5 cm phlegmon and probable early abscess formation within the left pre-auricular soft tissues as above. 2. No other drainable fluid collections identified. No evidence for concomitant otomastoiditis. Electronically Signed   By: Rise Mu M.D.   On: 09/23/2023 22:54    Pending  Labs Unresulted Labs (From admission, onward)    None       Vitals/Pain Today's Vitals   09/23/23 2141 09/23/23 2144 09/23/23 2145  BP: 121/79    Pulse: (!) 108    Resp: 16    Temp: 99.3 F (37.4 C)    TempSrc: Oral    SpO2: 95%    Weight:   131.5 kg  Height:   5\' 5"  (1.651 m)  PainSc:  9      Isolation Precautions No active isolations  Medications Medications  vancomycin (VANCOREADY) IVPB 2000 mg/400 mL (has no administration in time range)  iohexol (OMNIPAQUE) 300 MG/ML solution 75 mL (75 mLs Intravenous Contrast Given 09/23/23 2216)  ceFEPIme (MAXIPIME) 2 g in sodium chloride 0.9 % 100 mL IVPB (2 g Intravenous New Bag/Given 09/23/23 2322)    Mobility walks     Focused Assessments    R Recommendations: See Admitting Provider Note  Report given to:   Additional Notes:

## 2023-09-25 DIAGNOSIS — H60502 Unspecified acute noninfective otitis externa, left ear: Secondary | ICD-10-CM | POA: Diagnosis not present

## 2023-09-25 LAB — GLUCOSE, CAPILLARY
Glucose-Capillary: 103 mg/dL — ABNORMAL HIGH (ref 70–99)
Glucose-Capillary: 111 mg/dL — ABNORMAL HIGH (ref 70–99)
Glucose-Capillary: 116 mg/dL — ABNORMAL HIGH (ref 70–99)
Glucose-Capillary: 105 mg/dL — ABNORMAL HIGH (ref 70–99)

## 2023-09-25 LAB — CBC
HCT: 38.4 % — ABNORMAL LOW (ref 39.0–52.0)
Hemoglobin: 12 g/dL — ABNORMAL LOW (ref 13.0–17.0)
MCH: 30 pg (ref 26.0–34.0)
MCHC: 31.3 g/dL (ref 30.0–36.0)
MCV: 96 fL (ref 80.0–100.0)
Platelets: 166 10*3/uL (ref 150–400)
RBC: 4 MIL/uL — ABNORMAL LOW (ref 4.22–5.81)
RDW: 13.2 % (ref 11.5–15.5)
WBC: 8.5 10*3/uL (ref 4.0–10.5)
nRBC: 0 % (ref 0.0–0.2)

## 2023-09-25 LAB — COMPREHENSIVE METABOLIC PANEL
ALT: 18 U/L (ref 0–44)
AST: 14 U/L — ABNORMAL LOW (ref 15–41)
Albumin: 3 g/dL — ABNORMAL LOW (ref 3.5–5.0)
Alkaline Phosphatase: 76 U/L (ref 38–126)
Anion gap: 8 (ref 5–15)
BUN: 15 mg/dL (ref 8–23)
CO2: 24 mmol/L (ref 22–32)
Calcium: 8.8 mg/dL — ABNORMAL LOW (ref 8.9–10.3)
Chloride: 104 mmol/L (ref 98–111)
Creatinine, Ser: 1.19 mg/dL (ref 0.61–1.24)
GFR, Estimated: >60 mL/min (ref >60)
Glucose, Bld: 103 mg/dL — ABNORMAL HIGH (ref 70–99)
Potassium: 3.9 mmol/L (ref 3.5–5.1)
Sodium: 136 mmol/L (ref 135–145)
Total Bilirubin: 0.6 mg/dL (ref <1.2)
Total Protein: 6.7 g/dL (ref 6.5–8.1)

## 2023-09-25 LAB — MAGNESIUM: Magnesium: 2.1 mg/dL (ref 1.7–2.4)

## 2023-09-25 LAB — PHOSPHORUS: Phosphorus: 3.8 mg/dL (ref 2.5–4.6)

## 2023-09-25 MED ORDER — ORAL CARE MOUTH RINSE: 15.0000 mL | OROMUCOSAL | Status: DC | PRN

## 2023-09-25 MED ORDER — CIPROFLOXACIN-HYDROCORTISONE 0.2-1 % OT SUSP
3.0000 [drp] | Freq: Two times a day (BID) | OTIC | Status: DC
  Filled 2023-09-25: qty 10

## 2023-09-25 MED ORDER — CIPROFLOXACIN-DEXAMETHASONE 0.3-0.1 % OT SUSP
4.0000 [drp] | Freq: Two times a day (BID) | OTIC | Status: DC
  Administered 2023-09-25 – 2023-09-26 (×3): 4 [drp] via OTIC
  Filled 2023-09-25: qty 7.5

## 2023-09-25 NOTE — Progress Notes (Signed)
Pt called nurse to room. His left ear is drainage from the tragus (sore). Yellow drainage. No odor. Pt left ear is tender to touch. Cleaned ear.

## 2023-09-25 NOTE — Plan of Care (Signed)

## 2023-09-25 NOTE — Progress Notes (Signed)
Transition of Care Department [facility]) has reviewed patient and no TOC needs have been identified at this time. We will continue to monitor patient advancement through interdisciplinary progression rounds. If new patient transition needs arise, please place a TOC consult.   09/25/23 0759  TOC Brief Assessment  Insurance and Status Reviewed  Patient has primary care physician Yes  Home environment has been reviewed Lives with wife.  Prior level of function: Independent.  Prior/Current Home Services No current home services  Social Determinants of Health Reivew SDOH reviewed no interventions necessary  Readmission risk has been reviewed Yes  Transition of care needs no transition of care needs at this time

## 2023-09-25 NOTE — Progress Notes (Signed)
PROGRESS NOTE    Roberto Guerra  VQQ:[id number] DOB: 1960/12/29 DOA: 09/23/2023 PCP: Benita Stabile, MD   Brief Narrative:    Roberto Guerra is a 62 y.o. male with medical history significant of hypertension, hyperlipidemia, HFrEF, T2DM who presents to the emergency department due to about 10-day onset of pain and swelling around left ear.  Patient was admitted with otitis externa to left ear with regional cellulitis and superimposed phlegmon that failed oral antibiotics.  Assessment & Plan:   Principal Problem:   Otitis externa of left ear Active Problems:   Mixed hyperlipidemia   Essential hypertension   Cellulitis of face   Phlegmon   HFrEF (heart failure with reduced ejection fraction) (HCC)   Type 2 diabetes mellitus with hyperglycemia (HCC)   Morbid obesity with BMI of 50.0-59.9, adult (HCC)  Assessment and Plan:   Otitis externa of left ear with regional cellulitis of adjacent left side of face and superimposed phlegmon  CT was suggestive of regional cellulitis affecting the left side of face  Superimposed 1.7 x 1.4 x 2.5 cm phlegmon was suggested Patient was started on IV cefepime and vancomycin, we shall continue with IV ciprofloxacin and vancomycin Continue oxycodone 5 mg every 4 hours as needed for moderate/severe pain Continue on Tylenol as needed for mild pain/fever Leukocytosis resolving, continue to follow CBC Start otic ciprofloxacin with hydrocortisone 11/18 Patient may require consult/outpatient follow-up with an ENT   Probable early abscess formation CT maxillofacial was suggestive of probable early abscess formation Appears to be draining, apply warm compresses   Essential hypertension Continue Coreg, Avapro   Mixed hyperlipidemia Continue Pravachol, Lovaza   History of HFrEF Echocardiogram done in September 2020 showed an improved ejection fraction at 55 to 60% from 40 to 45% in May 2020. Continue Coreg, Avapro, Pravachol   T2DM with  hyperglycemia Continue ISS and hypoglycemia protocol Patient takes Mounjaro weekly   Morbid obesity (BMI 50.44) Diet and lifestyle modification Patient takes Mounjaro weekly    DVT prophylaxis:Lovenox Code Status: Full Family Communication: None at bedside Disposition Plan:  Status is: Inpatient Remains inpatient appropriate because: Need for IV antibiotics  Consultants:  None  Procedures:  None  Antimicrobials:  Anti-infectives (From admission, onward)    Start     Dose/Rate Route Frequency Ordered Stop   09/24/23 2200  vancomycin (VANCOREADY) IVPB 1750 mg/350 mL        1,750 mg 175 mL/hr over 120 Minutes Intravenous Every 24 hours 09/24/23 0802     09/24/23 1000  ciprofloxacin (CIPRO) IVPB 400 mg        400 mg 200 mL/hr over 60 Minutes Intravenous Every 12 hours 09/24/23 0542     09/24/23 1000  vancomycin (VANCOREADY) IVPB 1250 mg/250 mL  Status:  Discontinued        1,250 mg 166.7 mL/hr over 90 Minutes Intravenous Every 12 hours 09/24/23 0602 09/24/23 0802   09/24/23 0630  vancomycin (VANCOCIN) IVPB 1000 mg/200 mL premix  Status:  Discontinued        1,000 mg 200 mL/hr over 60 Minutes Intravenous  Once 09/24/23 0542 09/24/23 0602   09/23/23 2330  ceFEPIme (MAXIPIME) 2 g in sodium chloride 0.9 % 100 mL IVPB        2 g 200 mL/hr over 30 Minutes Intravenous  Once 09/23/23 2318 09/24/23 0011   09/23/23 2330  vancomycin (VANCOREADY) IVPB 2000 mg/400 mL        2,000 mg 200 mL/hr over 120 Minutes Intravenous  Once 09/23/23 2319 09/24/23 0305      Subjective: Patient seen and evaluated today with no new acute complaints or concerns. No acute concerns or events noted overnight.  He continues to have some left ear pain with no drainage noted.  Warm compresses appear to be helping.  Objective: Vitals:   09/24/23 2200 09/25/23 0558 09/25/23 0800 09/25/23 0907  BP:  114/72 131/88 131/88  Pulse:  93 91 91  Resp:  16 16   Temp: 100.3 F (37.9 C) 98.5 F (36.9 C) 98.2 F  (36.8 C)   TempSrc: Oral  Oral   SpO2:  93% 97%   Weight:      Height:        Intake/Output Summary (Last 24 hours) at 09/25/2023 0938 Last data filed at 09/25/2023 0300 Gross per 24 hour  Intake 1197.87 ml  Output --  Net 1197.87 ml   Filed Weights   09/23/23 2145 09/24/23 0029  Weight: 131.5 kg 133.3 kg    Examination:  General exam: Appears calm and comfortable, morbidly obese Respiratory system: Clear to auscultation. Respiratory effort normal. Cardiovascular system: S1 & S2 heard, RRR.  Gastrointestinal system: Abdomen is soft Central nervous system: Alert and awake Extremities: No edema Skin: No significant lesions noted, left ear swelling with erythema Psychiatry: Flat affect.    Data Reviewed: I have personally reviewed following labs and imaging studies  CBC: Recent Labs  Lab 09/23/23 2205 09/24/23 1046 09/25/23 0445  WBC 10.9* 7.6 8.5  HGB 12.9* 12.4* 12.0*  HCT 39.4 38.6* 38.4*  MCV 95.9 96.0 96.0  PLT 176 170 166   Basic Metabolic Panel: Recent Labs  Lab 09/23/23 2205 09/25/23 0445  NA 137 136  K 3.7 3.9  CL 105 104  CO2 24 24  GLUCOSE 142* 103*  BUN 14 15  CREATININE 1.21 1.19  CALCIUM 8.9 8.8*  MG  --  2.1  PHOS  --  3.8   GFR: Estimated Creatinine Clearance: 81.9 mL/min (by C-G formula based on SCr of 1.19 mg/dL). Liver Function Tests: Recent Labs  Lab 09/25/23 0445  AST 14*  ALT 18  ALKPHOS 76  BILITOT 0.6  PROT 6.7  ALBUMIN 3.0*   No results for input(s): "LIPASE", "AMYLASE" in the last 168 hours. No results for input(s): "AMMONIA" in the last 168 hours. Coagulation Profile: No results for input(s): "INR", "PROTIME" in the last 168 hours. Cardiac Enzymes: No results for input(s): "CKTOTAL", "CKMB", "CKMBINDEX", "TROPONINI" in the last 168 hours. BNP (last 3 results) No results for input(s): "PROBNP" in the last 8760 hours. HbA1C: Recent Labs    09/24/23 0556  HGBA1C 6.1*   CBG: Recent Labs  Lab 09/24/23 0726  09/24/23 1133 09/24/23 1635 09/24/23 2218 09/25/23 0742  GLUCAP 113* 90 118* 158* 103*   Lipid Profile: No results for input(s): "CHOL", "HDL", "LDLCALC", "TRIG", "CHOLHDL", "LDLDIRECT" in the last 72 hours. Thyroid Function Tests: No results for input(s): "TSH", "T4TOTAL", "FREET4", "T3FREE", "THYROIDAB" in the last 72 hours. Anemia Panel: No results for input(s): "VITAMINB12", "FOLATE", "FERRITIN", "TIBC", "IRON", "RETICCTPCT" in the last 72 hours. Sepsis Labs: No results for input(s): "PROCALCITON", "LATICACIDVEN" in the last 168 hours.  No results found for this or any previous visit (from the past 240 hour(s)).       Radiology Studies: CT Maxillofacial W Contrast  Result Date: 09/23/2023 CLINICAL DATA:  Initial evaluation for abscess. EXAM: CT MAXILLOFACIAL WITH CONTRAST TECHNIQUE: Multidetector CT imaging of the maxillofacial structures was performed with intravenous contrast.  Multiplanar CT image reconstructions were also generated. RADIATION DOSE REDUCTION: This exam was performed according to the departmental dose-optimization program which includes automated exposure control, adjustment of the mA and/or kV according to patient size and/or use of iterative reconstruction technique. CONTRAST:  75mL OMNIPAQUE IOHEXOL 300 MG/ML  SOLN COMPARISON:  None Available. FINDINGS: Osseous: No acute osseous abnormality. No discrete or worrisome osseous lesions. Orbits: Globes and orbital soft tissues within normal limits. Sinuses: Paranasal sinuses are clear. Mastoid air cells and middle ear cavities are largely clear as well. Soft tissues: Soft tissue opacity within the left EAC, likely cerumen. Right auricle and tragus are thickened and edematous, compatible with acute chondritis. Swelling with hazy inflammatory stranding within the adjacent left pre and postauricular soft tissues, with inferior extension to involve the left face towards the submental region. Superimposed complex lesion with  small central hypodensity positioned within the left pre-auricular soft tissues measures 1.7 x 1.4 x 2.5 cm, consistent with phlegmon and probable early abscess formation (series 2, image 24). No other drainable fluid collections identified. No evidence for concomitant otomastoiditis. Mildly prominent left upper lymph nodes, presumably reactive. Limited intracranial: Unremarkable. IMPRESSION: 1. Findings suggestive of acute left-sided otitis externa with regional cellulitis involving the adjacent left face. Superimposed 1.7 x 1.4 x 2.5 cm phlegmon and probable early abscess formation within the left pre-auricular soft tissues as above. 2. No other drainable fluid collections identified. No evidence for concomitant otomastoiditis. Electronically Signed   By: Rise Mu M.D.   On: 09/23/2023 22:54        Scheduled Meds:  carvedilol  12.5 mg Oral BID WC   ciprofloxacin-hydrocortisone  3 drop Left EAR BID   enoxaparin (LOVENOX) injection  65 mg Subcutaneous Q24H   insulin aspart  0-15 Units Subcutaneous TID WC   irbesartan  300 mg Oral Daily   omega-3 acid ethyl esters  1 g Oral Daily   pravastatin  40 mg Oral q morning   Continuous Infusions:  ciprofloxacin 400 mg (09/25/23 0906)   vancomycin Stopped (09/25/23 0908)     LOS: 2 days    Time spent: 65 minutes    Dayan Desa D Sherryll Burger, DO [facility]  If 7PM-7AM, please contact night-coverage [email or web address] 09/25/2023, 9:38 AM

## 2023-09-25 NOTE — Plan of Care (Signed)
Pt is alert and oriented x4. Up adlib. Received cipro and Vanc tonight. Vitals stable. Slighted elevated temp but stable 100.3. Pt received 2 doses of percocet this shift. Warm compress applied x 2 pt reports effective and helped decrease pain.  Problem: Education: Goal: Knowledge of General Education information will improve Description: Including pain rating scale, medication(s)/side effects and non-pharmacologic comfort measures Outcome: Progressing   Problem: Health Behavior/Discharge Planning: Goal: Ability to manage health-related needs will improve Outcome: Progressing   Problem: Clinical Measurements: Goal: Ability to maintain clinical measurements within normal limits will improve Outcome: Progressing Goal: Will remain free from infection Outcome: Progressing Goal: Diagnostic test results will improve Outcome: Progressing Goal: Respiratory complications will improve Outcome: Progressing Goal: Cardiovascular complication will be avoided Outcome: Progressing   Problem: Activity: Goal: Risk for activity intolerance will decrease Outcome: Progressing   Problem: Nutrition: Goal: Adequate nutrition will be maintained Outcome: Progressing   Problem: Coping: Goal: Level of anxiety will decrease Outcome: Progressing   Problem: Elimination: Goal: Will not experience complications related to bowel motility Outcome: Progressing Goal: Will not experience complications related to urinary retention Outcome: Progressing   Problem: Pain Management: Goal: General experience of comfort will improve Outcome: Progressing   Problem: Safety: Goal: Ability to remain free from injury will improve Outcome: Progressing   Problem: Skin Integrity: Goal: Risk for impaired skin integrity will decrease Outcome: Progressing   Problem: Education: Goal: Ability to describe self-care measures that may prevent or decrease complications (Diabetes Survival Skills Education) will  improve Outcome: Progressing Goal: Individualized Educational Video(s) Outcome: Progressing   Problem: Coping: Goal: Ability to adjust to condition or change in health will improve Outcome: Progressing   Problem: Fluid Volume: Goal: Ability to maintain a balanced intake and output will improve Outcome: Progressing   Problem: Health Behavior/Discharge Planning: Goal: Ability to identify and utilize available resources and services will improve Outcome: Progressing Goal: Ability to manage health-related needs will improve Outcome: Progressing   Problem: Metabolic: Goal: Ability to maintain appropriate glucose levels will improve Outcome: Progressing   Problem: Nutritional: Goal: Maintenance of adequate nutrition will improve Outcome: Progressing Goal: Progress toward achieving an optimal weight will improve Outcome: Progressing   Problem: Skin Integrity: Goal: Risk for impaired skin integrity will decrease Outcome: Progressing   Problem: Tissue Perfusion: Goal: Adequacy of tissue perfusion will improve Outcome: Progressing

## 2023-09-26 DIAGNOSIS — H60502 Unspecified acute noninfective otitis externa, left ear: Secondary | ICD-10-CM | POA: Diagnosis not present

## 2023-09-26 LAB — GLUCOSE, CAPILLARY
Glucose-Capillary: 115 mg/dL — ABNORMAL HIGH (ref 70–99)
Glucose-Capillary: 121 mg/dL — ABNORMAL HIGH (ref 70–99)

## 2023-09-26 LAB — CBC
HCT: 38.1 % — ABNORMAL LOW (ref 39.0–52.0)
Hemoglobin: 11.9 g/dL — ABNORMAL LOW (ref 13.0–17.0)
MCH: 30.3 pg (ref 26.0–34.0)
MCHC: 31.2 g/dL (ref 30.0–36.0)
MCV: 96.9 fL (ref 80.0–100.0)
Platelets: 171 10*3/uL (ref 150–400)
RBC: 3.93 MIL/uL — ABNORMAL LOW (ref 4.22–5.81)
RDW: 13.1 % (ref 11.5–15.5)
WBC: 8.2 10*3/uL (ref 4.0–10.5)
nRBC: 0 % (ref 0.0–0.2)

## 2023-09-26 MED ORDER — CIPROFLOXACIN-DEXAMETHASONE 0.3-0.1 % OT SUSP: 4.0000 [drp] | Freq: Two times a day (BID) | OTIC | 0 refills | Status: AC

## 2023-09-26 MED ORDER — CIPROFLOXACIN HCL 500 MG PO TABS: 500.0000 mg | ORAL_TABLET | Freq: Two times a day (BID) | ORAL | 0 refills | Status: AC

## 2023-09-26 NOTE — Plan of Care (Signed)
  Problem: Education: Goal: Knowledge of General Education information will improve Description: Including pain rating scale, medication(s)/side effects and non-pharmacologic comfort measures Outcome: Completed/Met   Problem: Health Behavior/Discharge Planning: Goal: Ability to manage health-related needs will improve Outcome: Completed/Met   Problem: Clinical Measurements: Goal: Ability to maintain clinical measurements within normal limits will improve Outcome: Completed/Met Goal: Will remain free from infection Outcome: Completed/Met Goal: Diagnostic test results will improve Outcome: Completed/Met Goal: Respiratory complications will improve Outcome: Completed/Met Goal: Cardiovascular complication will be avoided Outcome: Completed/Met   Problem: Activity: Goal: Risk for activity intolerance will decrease Outcome: Completed/Met   Problem: Nutrition: Goal: Adequate nutrition will be maintained Outcome: Completed/Met   Problem: Coping: Goal: Level of anxiety will decrease Outcome: Completed/Met   Problem: Elimination: Goal: Will not experience complications related to bowel motility Outcome: Completed/Met Goal: Will not experience complications related to urinary retention Outcome: Completed/Met   Problem: Pain Management: Goal: General experience of comfort will improve Outcome: Completed/Met   Problem: Safety: Goal: Ability to remain free from injury will improve Outcome: Completed/Met   Problem: Skin Integrity: Goal: Risk for impaired skin integrity will decrease Outcome: Completed/Met   Problem: Education: Goal: Ability to describe self-care measures that may prevent or decrease complications (Diabetes Survival Skills Education) will improve Outcome: Completed/Met Goal: Individualized Educational Video(s) Outcome: Completed/Met   Problem: Coping: Goal: Ability to adjust to condition or change in health will improve Outcome: Completed/Met   Problem:  Fluid Volume: Goal: Ability to maintain a balanced intake and output will improve Outcome: Completed/Met   Problem: Health Behavior/Discharge Planning: Goal: Ability to identify and utilize available resources and services will improve Outcome: Completed/Met Goal: Ability to manage health-related needs will improve Outcome: Completed/Met   Problem: Metabolic: Goal: Ability to maintain appropriate glucose levels will improve Outcome: Completed/Met   Problem: Nutritional: Goal: Maintenance of adequate nutrition will improve Outcome: Completed/Met Goal: Progress toward achieving an optimal weight will improve Outcome: Completed/Met   Problem: Skin Integrity: Goal: Risk for impaired skin integrity will decrease Outcome: Completed/Met   Problem: Tissue Perfusion: Goal: Adequacy of tissue perfusion will improve Outcome: Completed/Met

## 2023-09-26 NOTE — Discharge Summary (Signed)
Physician Discharge Summary  Roberto Guerra QIH:[id number] DOB: 29-Apr-1961 DOA: 09/23/2023  PCP: Benita Stabile, MD  Admit date: 09/23/2023  Discharge date: 09/26/2023  Admitted From:Home  Disposition:  Home  Recommendations for Outpatient Follow-up:  Follow up with PCP in 1-2 weeks Continue on ciprofloxacin oral as well as eardrops as prescribed to complete course of treatment Consider follow-up with ENT in the future as needed Continue other home medications as prior  Home Health: None  Equipment/Devices: None  Discharge Condition:Stable  CODE STATUS: Full  Diet recommendation: Heart Healthy/carb modified  Brief/Interim Summary: Roberto Guerra is a 62 y.o. male with medical history significant of hypertension, hyperlipidemia, HFrEF, T2DM who presents to the emergency department due to about 10-day onset of pain and swelling around left ear.  Patient was admitted with otitis externa to left ear with regional cellulitis and superimposed phlegmon that failed oral antibiotics.  He has had CT scan with no significant concerns noted, but still had continued ear pain with ongoing drainage.  Leukocytosis has resolved on IV antibiotics.  He has been given ciprofloxacin otic drops as well as oral ciprofloxacin to complete course of treatment.  No other acute events or concerns noted throughout the course of the stay.  Discharge Diagnoses:  Principal Problem:   Otitis externa of left ear Active Problems:   Mixed hyperlipidemia   Essential hypertension   Cellulitis of face   Phlegmon   HFrEF (heart failure with reduced ejection fraction) (HCC)   Type 2 diabetes mellitus with hyperglycemia (HCC)   Morbid obesity with BMI of 50.0-59.9, adult (HCC)  Principal discharge diagnosis: Otitis externa of the left ear with regional cellulitis with superimposed phlegmon.  Discharge Instructions  Discharge Instructions     Diet - low sodium heart healthy   Complete by: As directed     Increase activity slowly   Complete by: As directed       Allergies as of 09/26/2023   No Known Allergies      Medication List     STOP taking these medications    cephALEXin 500 MG capsule Commonly known as: KEFLEX       TAKE these medications    allopurinol 300 MG tablet Commonly known as: ZYLOPRIM Take 300 mg by mouth daily.   amLODipine 10 MG tablet Commonly known as: NORVASC Take 10 mg by mouth daily.   carvedilol 12.5 MG tablet Commonly known as: COREG TAKE 1 TABLET BY MOUTH TWICE DAILY WITH A MEAL   ciprofloxacin 500 MG tablet Commonly known as: Cipro Take 1 tablet (500 mg total) by mouth 2 (two) times daily for 4 days.   ciprofloxacin-dexamethasone OTIC suspension Commonly known as: CIPRODEX Place 4 drops into the left ear 2 (two) times daily for 7 days.   furosemide 20 MG tablet Commonly known as: LASIX TAKE 1 TABLET BY MOUTH ONCE DAILY . APPOINTMENT REQUIRED FOR FUTURE REFILLS   gabapentin 300 MG capsule Commonly known as: NEURONTIN Take 300 mg by mouth 3 (three) times daily.   HYDROcodone-acetaminophen 5-325 MG tablet Commonly known as: NORCO/VICODIN Take 1 tablet by mouth every 6 (six) hours as needed for severe pain.   meclizine 25 MG tablet Commonly known as: ANTIVERT Take 1 tablet (25 mg total) by mouth 3 (three) times daily as needed for dizziness.   Mounjaro 12.5 MG/0.5ML Pen Generic drug: tirzepatide Inject 12.5 mg into the skin once a week. Sundays   naproxen 500 MG tablet Commonly known as: NAPROSYN Take 1 tablet by  mouth twice daily   nitroGLYCERIN 0.4 MG SL tablet Commonly known as: NITROSTAT Place 1 tablet (0.4 mg total) under the tongue every 5 (five) minutes as needed for chest pain (CP or SOB).   olmesartan 40 MG tablet Commonly known as: BENICAR Take 1 tablet (40 mg total) by mouth daily.   potassium chloride 10 MEQ CR capsule Commonly known as: MICRO-K Take 10 mEq by mouth 2 (two) times daily.   pravastatin 40  MG tablet Commonly known as: PRAVACHOL Take 40 mg by mouth every morning.   terbinafine 250 MG tablet Commonly known as: LAMISIL Take 250 mg by mouth daily.   tiZANidine 4 MG capsule Commonly known as: Zanaflex Take 1 capsule (4 mg total) by mouth 3 (three) times daily as needed for muscle spasms. Do not drink alcohol or drive while taking this medication.  May cause drowsiness.        Follow-up Information     Benita Stabile, MD. Schedule an appointment as soon as possible for a visit in 1 week(s).   Specialty: Internal Medicine Contact information: [address] [place] [address] [phone]                No Known Allergies  Consultations: None   Procedures/Studies: CT Maxillofacial W Contrast  Result Date: 09/23/2023 CLINICAL DATA:  Initial evaluation for abscess. EXAM: CT MAXILLOFACIAL WITH CONTRAST TECHNIQUE: Multidetector CT imaging of the maxillofacial structures was performed with intravenous contrast. Multiplanar CT image reconstructions were also generated. RADIATION DOSE REDUCTION: This exam was performed according to the departmental dose-optimization program which includes automated exposure control, adjustment of the mA and/or kV according to patient size and/or use of iterative reconstruction technique. CONTRAST:  75mL OMNIPAQUE IOHEXOL 300 MG/ML  SOLN COMPARISON:  None Available. FINDINGS: Osseous: No acute osseous abnormality. No discrete or worrisome osseous lesions. Orbits: Globes and orbital soft tissues within normal limits. Sinuses: Paranasal sinuses are clear. Mastoid air cells and middle ear cavities are largely clear as well. Soft tissues: Soft tissue opacity within the left EAC, likely cerumen. Right auricle and tragus are thickened and edematous, compatible with acute chondritis. Swelling with hazy inflammatory stranding within the adjacent left pre and postauricular soft tissues, with inferior extension to involve the left face towards  the submental region. Superimposed complex lesion with small central hypodensity positioned within the left pre-auricular soft tissues measures 1.7 x 1.4 x 2.5 cm, consistent with phlegmon and probable early abscess formation (series 2, image 24). No other drainable fluid collections identified. No evidence for concomitant otomastoiditis. Mildly prominent left upper lymph nodes, presumably reactive. Limited intracranial: Unremarkable. IMPRESSION: 1. Findings suggestive of acute left-sided otitis externa with regional cellulitis involving the adjacent left face. Superimposed 1.7 x 1.4 x 2.5 cm phlegmon and probable early abscess formation within the left pre-auricular soft tissues as above. 2. No other drainable fluid collections identified. No evidence for concomitant otomastoiditis. Electronically Signed   By: Rise Mu M.D.   On: 09/23/2023 22:54     Discharge Exam: Vitals:   09/26/23 0333 09/26/23 0843  BP: (!) 100/56 110/77  Pulse: 83 82  Resp: 18 16  Temp: 98.7 F (37.1 C)   SpO2: 94%    Vitals:   09/25/23 1726 09/25/23 2036 09/26/23 0333 09/26/23 0843  BP: 131/86 101/63 (!) 100/56 110/77  Pulse: 93 86 83 82  Resp:  18 18 16   Temp:  98.8 F (37.1 C) 98.7 F (37.1 C)   TempSrc:  Oral Oral  SpO2:  93% 94%   Weight:      Height:        General: Pt is alert, awake, not in acute distress Cardiovascular: RRR, S1/S2 +, no rubs, no gallops Respiratory: CTA bilaterally, no wheezing, no rhonchi Abdominal: Soft, NT, ND, bowel sounds + Extremities: no edema, no cyanosis    The results of significant diagnostics from this hospitalization (including imaging, microbiology, ancillary and laboratory) are listed below for reference.     Microbiology: No results found for this or any previous visit (from the past 240 hour(s)).   Labs: BNP (last 3 results) No results for input(s): "BNP" in the last 8760 hours. Basic Metabolic Panel: Recent Labs  Lab 09/23/23 2205  09/25/23 0445  NA 137 136  K 3.7 3.9  CL 105 104  CO2 24 24  GLUCOSE 142* 103*  BUN 14 15  CREATININE 1.21 1.19  CALCIUM 8.9 8.8*  MG  --  2.1  PHOS  --  3.8   Liver Function Tests: Recent Labs  Lab 09/25/23 0445  AST 14*  ALT 18  ALKPHOS 76  BILITOT 0.6  PROT 6.7  ALBUMIN 3.0*   No results for input(s): "LIPASE", "AMYLASE" in the last 168 hours. No results for input(s): "AMMONIA" in the last 168 hours. CBC: Recent Labs  Lab 09/23/23 2205 09/24/23 1046 09/25/23 0445 09/26/23 0431  WBC 10.9* 7.6 8.5 8.2  HGB 12.9* 12.4* 12.0* 11.9*  HCT 39.4 38.6* 38.4* 38.1*  MCV 95.9 96.0 96.0 96.9  PLT 176 170 166 171   Cardiac Enzymes: No results for input(s): "CKTOTAL", "CKMB", "CKMBINDEX", "TROPONINI" in the last 168 hours. BNP: Invalid input(s): "POCBNP" CBG: Recent Labs  Lab 09/25/23 0742 09/25/23 1109 09/25/23 1710 09/25/23 2038 09/26/23 0709  GLUCAP 103* 111* 116* 105* 115*   D-Dimer No results for input(s): "DDIMER" in the last 72 hours. Hgb A1c Recent Labs    09/24/23 0556  HGBA1C 6.1*   Lipid Profile No results for input(s): "CHOL", "HDL", "LDLCALC", "TRIG", "CHOLHDL", "LDLDIRECT" in the last 72 hours. Thyroid function studies No results for input(s): "TSH", "T4TOTAL", "T3FREE", "THYROIDAB" in the last 72 hours.  Invalid input(s): "FREET3" Anemia work up No results for input(s): "VITAMINB12", "FOLATE", "FERRITIN", "TIBC", "IRON", "RETICCTPCT" in the last 72 hours. Urinalysis    Component Value Date/Time   COLORURINE yellow 06/27/2008 1549   APPEARANCEUR Clear 06/27/2008 1549   LABSPEC 1.020 06/27/2008 1549   PHURINE 5.0 06/27/2008 1549   HGBUR negative 06/27/2008 1549   BILIRUBINUR negative 06/27/2008 1549   UROBILINOGEN 2.0 06/27/2008 1549   NITRITE negative 06/27/2008 1549   Sepsis Labs Recent Labs  Lab 09/23/23 2205 09/24/23 1046 09/25/23 0445 09/26/23 0431  WBC 10.9* 7.6 8.5 8.2   Microbiology No results found for this or any  previous visit (from the past 240 hour(s)).   Time coordinating discharge: 35 minutes  SIGNED:   Erick Blinks, DO [facility] 09/26/2023, 9:43 AM  If 7PM-7AM, please contact night-coverage [email or web address]

## 2023-09-26 NOTE — Plan of Care (Signed)
Pt alert and oriented x 4. Up adlib. Received 3 doses of percocet this shift. Warm moist compress applied x 4. Nodule to left ear oozing purulent drainage. Afebrile and vitals stable.  Problem: Education: Goal: Knowledge of General Education information will improve Description: Including pain rating scale, medication(s)/side effects and non-pharmacologic comfort measures Outcome: Progressing   Problem: Health Behavior/Discharge Planning: Goal: Ability to manage health-related needs will improve Outcome: Progressing   Problem: Clinical Measurements: Goal: Ability to maintain clinical measurements within normal limits will improve Outcome: Progressing Goal: Will remain free from infection Outcome: Progressing Goal: Diagnostic test results will improve Outcome: Progressing Goal: Respiratory complications will improve Outcome: Progressing Goal: Cardiovascular complication will be avoided Outcome: Progressing   Problem: Activity: Goal: Risk for activity intolerance will decrease Outcome: Progressing   Problem: Nutrition: Goal: Adequate nutrition will be maintained Outcome: Progressing   Problem: Coping: Goal: Level of anxiety will decrease Outcome: Progressing   Problem: Elimination: Goal: Will not experience complications related to bowel motility Outcome: Progressing Goal: Will not experience complications related to urinary retention Outcome: Progressing   Problem: Pain Management: Goal: General experience of comfort will improve Outcome: Progressing   Problem: Safety: Goal: Ability to remain free from injury will improve Outcome: Progressing   Problem: Skin Integrity: Goal: Risk for impaired skin integrity will decrease Outcome: Progressing   Problem: Education: Goal: Ability to describe self-care measures that may prevent or decrease complications (Diabetes Survival Skills Education) will improve Outcome: Progressing Goal: Individualized Educational  Video(s) Outcome: Progressing   Problem: Coping: Goal: Ability to adjust to condition or change in health will improve Outcome: Progressing   Problem: Fluid Volume: Goal: Ability to maintain a balanced intake and output will improve Outcome: Progressing   Problem: Health Behavior/Discharge Planning: Goal: Ability to identify and utilize available resources and services will improve Outcome: Progressing Goal: Ability to manage health-related needs will improve Outcome: Progressing   Problem: Metabolic: Goal: Ability to maintain appropriate glucose levels will improve Outcome: Progressing   Problem: Nutritional: Goal: Maintenance of adequate nutrition will improve Outcome: Progressing Goal: Progress toward achieving an optimal weight will improve Outcome: Progressing   Problem: Skin Integrity: Goal: Risk for impaired skin integrity will decrease Outcome: Progressing   Problem: Tissue Perfusion: Goal: Adequacy of tissue perfusion will improve Outcome: Progressing

## 2023-09-27 DIAGNOSIS — H6092 Unspecified otitis externa, left ear: Secondary | ICD-10-CM | POA: Diagnosis not present

## 2023-09-27 DIAGNOSIS — Z7689 Persons encountering health services in other specified circumstances: Secondary | ICD-10-CM | POA: Diagnosis not present

## 2023-09-27 DIAGNOSIS — H6012 Cellulitis of left external ear: Secondary | ICD-10-CM | POA: Diagnosis not present

## 2023-09-27 DIAGNOSIS — R944 Abnormal results of kidney function studies: Secondary | ICD-10-CM | POA: Diagnosis not present

## 2023-09-27 DIAGNOSIS — E1165 Type 2 diabetes mellitus with hyperglycemia: Secondary | ICD-10-CM | POA: Diagnosis not present

## 2023-10-08 ENCOUNTER — Other Ambulatory Visit: Payer: Self-pay | Admitting: Orthopedic Surgery

## 2023-10-08 ENCOUNTER — Other Ambulatory Visit: Payer: Self-pay | Admitting: Cardiology

## 2023-10-08 DIAGNOSIS — M1711 Unilateral primary osteoarthritis, right knee: Secondary | ICD-10-CM

## 2023-10-08 DIAGNOSIS — S8001XD Contusion of right knee, subsequent encounter: Secondary | ICD-10-CM

## 2023-11-06 ENCOUNTER — Other Ambulatory Visit: Payer: Self-pay | Admitting: Orthopedic Surgery

## 2023-11-06 ENCOUNTER — Other Ambulatory Visit: Payer: Self-pay | Admitting: Cardiology

## 2023-11-06 DIAGNOSIS — M1711 Unilateral primary osteoarthritis, right knee: Secondary | ICD-10-CM

## 2023-11-06 DIAGNOSIS — S8001XD Contusion of right knee, subsequent encounter: Secondary | ICD-10-CM

## 2023-11-22 ENCOUNTER — Ambulatory Visit: Payer: BC Managed Care – PPO | Attending: Cardiology | Admitting: Cardiology

## 2023-11-22 ENCOUNTER — Encounter: Payer: Self-pay | Admitting: Cardiology

## 2023-11-22 VITALS — BP 118/82 | HR 88 | Ht 64.0 in | Wt 288.6 lb

## 2023-11-22 DIAGNOSIS — R002 Palpitations: Secondary | ICD-10-CM | POA: Diagnosis not present

## 2023-11-22 DIAGNOSIS — I5032 Chronic diastolic (congestive) heart failure: Secondary | ICD-10-CM

## 2023-11-22 DIAGNOSIS — I10 Essential (primary) hypertension: Secondary | ICD-10-CM

## 2023-11-22 NOTE — Progress Notes (Signed)
 Clinical Summary Roberto Guerra is a 63 y.o.male seen today for follow up of the following medical problems.      1. HFimpEF - new diagnosis during 03/2019 admission in setting of HCAP - 03/2019 echo LVEF 40-45%, grade II diastolic dysfunction - 07/2019 echo LVEF 55-60%, grade II diastolic dysfunction - lasix  previously lowered due to dizziness, which did resolve     - no SOB/DOE, slight edema at times - compliant with meds      2. Palpitations - 05/2019 heart monitor 14 day 14 day monitor Occasional PVCs, rare couplets. Isolated 4 beat run of NSVT Occasioanl PACs, rare couplets and triplets No symptoms reported     -EKG today NSR - no recent palpitations.    3. Chest pain - multiple admits for chest pain, no clear signs of ischemia - 03/2019 nuclear stress no ischemia.  - no recent  chest pain.    4. HTN - compliant with meds     5. Dizziness - seen in ER 03/13/20 with dizziness - normal EKG and labs. CT head no acute findings - feeling of room spinning, though occurs with standing.  - better with meclizine , thought only occurs with standing.    - no recurrecne, resovled with lower lasix  dosing.    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 EF 40-45% in 03/2019 with NST showing no reversible ischemia   Diabetes mellitus without complication (HCC)    Gout    Hyperlipidemia    Hypertension    PNA (pneumonia)      No Known Allergies   Current Outpatient Medications  Medication Sig Dispense Refill   allopurinol  (ZYLOPRIM ) 300 MG tablet Take 300 mg by mouth daily.     amLODipine  (NORVASC ) 10 MG tablet Take 10 mg by mouth daily.     carvedilol  (COREG ) 12.5 MG tablet TAKE 1 TABLET BY MOUTH TWICE DAILY WITH A MEAL 180 tablet 0   furosemide  (LASIX ) 20 MG tablet Take 1 tablet by mouth once daily 7 tablet 0   gabapentin  (NEURONTIN ) 300 MG capsule Take 300 mg by mouth 3 (three) times  daily.      HYDROcodone -acetaminophen  (NORCO/VICODIN) 5-325 MG tablet Take 1 tablet by mouth every 6 (six) hours as needed for severe pain.     meclizine  (ANTIVERT ) 25 MG tablet Take 1 tablet (25 mg total) by mouth 3 (three) times daily as needed for dizziness. 30 tablet 0   MOUNJARO 12.5 MG/0.5ML Pen Inject 12.5 mg into the skin once a week. Sundays     naproxen  (NAPROSYN ) 500 MG tablet Take 1 tablet by mouth twice daily 60 tablet 5   nitroGLYCERIN  (NITROSTAT ) 0.4 MG SL tablet Place 1 tablet (0.4 mg total) under the tongue every 5 (five) minutes as needed for chest pain (CP or SOB). 25 tablet 3   olmesartan  (BENICAR ) 40 MG tablet Take 1 tablet (40 mg total) by mouth daily.     potassium chloride  (MICRO-K ) 10 MEQ CR capsule Take 10 mEq by mouth 2 (two) times daily.     pravastatin  (PRAVACHOL ) 40 MG tablet Take 40 mg by mouth every morning.      terbinafine (LAMISIL) 250 MG tablet Take 250 mg by mouth daily.     tiZANidine  (ZANAFLEX ) 4 MG capsule Take 1 capsule (4 mg total) by mouth 3 (three) times daily as needed for muscle spasms. Do not drink  alcohol or drive while taking this medication.  May cause drowsiness. 15 capsule 0   No current facility-administered medications for this visit.     Past Surgical History:  Procedure Laterality Date   CATARACT EXTRACTION W/PHACO Left 10/29/2018   Procedure: CATARACT EXTRACTION PHACO AND INTRAOCULAR LENS PLACEMENT (IOC);  Surgeon: Anner Kill, MD;  Location: [facility];  Service: Ophthalmology;  Laterality: Left;  CDE: 43.37   CATARACT EXTRACTION W/PHACO Right 12/30/2022   Procedure: CATARACT EXTRACTION PHACO AND INTRAOCULAR LENS PLACEMENT (IOC);  Surgeon: Ardeth Krabbe, MD;  Location: [facility];  Service: Ophthalmology;  Laterality: Right;  CDE: 3.17   COLONOSCOPY N/A 02/27/2014   Procedure: COLONOSCOPY;  Surgeon: Ruby Corporal, MD;  Location: [facility];  Service: Endoscopy;  Laterality: N/A;  830   NO PAST SURGERIES       No Known  Allergies    Family History  Problem Relation Age of Onset   Diabetes Other    Diabetes Sister    Colon cancer Neg Hx      Social History Roberto Guerra reports that he quit smoking about 24 years ago. His smoking use included cigarettes. He started smoking about 25 years ago. He has a 0.3 pack-year smoking history. He has never used smokeless tobacco. Roberto Guerra reports no history of alcohol use.   Review of Systems CONSTITUTIONAL: No weight loss, fever, chills, weakness or fatigue.  HEENT: Eyes: No visual loss, blurred vision, double vision or yellow sclerae.No hearing loss, sneezing, congestion, runny nose or sore throat.  SKIN: No rash or itching.  CARDIOVASCULAR: per hpi RESPIRATORY: No shortness of breath, cough or sputum.  GASTROINTESTINAL: No anorexia, nausea, vomiting or diarrhea. No abdominal pain or blood.  GENITOURINARY: No burning on urination, no polyuria NEUROLOGICAL: No headache, dizziness, syncope, paralysis, ataxia, numbness or tingling in the extremities. No change in bowel or bladder control.  MUSCULOSKELETAL: No muscle, back pain, joint pain or stiffness.  LYMPHATICS: No enlarged nodes. No history of splenectomy.  PSYCHIATRIC: No history of depression or anxiety.  ENDOCRINOLOGIC: No reports of sweating, cold or heat intolerance. No polyuria or polydipsia.  Aaron Aas   Physical Examination Today's Vitals   11/22/23 1030  BP: 118/82  Pulse: 88  SpO2: 95%  Weight: 288 lb 9.6 oz (130.9 kg)  Height: 5\' 4"  (1.626 m)   Body mass index is 49.54 kg/m.  Gen: resting comfortably, no acute distress HEENT: no scleral icterus, pupils equal round and reactive, no palptable cervical adenopathy,  CV: RRR, no m/rg, no jvd Resp: Clear to auscultation bilaterally GI: abdomen is soft, non-tender, non-distended, normal bowel sounds, no hepatosplenomegaly MSK: extremities are warm, no edema.  Skin: warm, no rash Neuro:  no focal deficits Psych: appropriate affect   Diagnostic  Studies  03/2019 echo IMPRESSIONS      1. The left ventricle has mild-moderately reduced systolic function, with an ejection fraction of 40-45%. The cavity size was normal. There is mild concentric left ventricular hypertrophy. Left ventricular diastolic Doppler parameters are consistent  with pseudonormalization. Elevated mean left atrial pressure.  2. The right ventricle has normal systolic function. The cavity was normal. There is no increase in right ventricular wall thickness. Right ventricular systolic pressure could not be assessed.  3. There is mild mitral annular calcification present.  4. The tricuspid valve is grossly normal.     05/2019 heart monitor 14 day 14 day monitor Occasional PVCs, rare couplets. Isolated 4 beat run of NSVT Occasioanl PACs, rare couplets and triplets No  symptoms reported   07/2019 echo IMPRESSIONS      1. The left ventricle has normal systolic function, with an ejection fraction of 55-60%. Left ventricular diastolic Doppler parameters are consistent with pseudonormal.  2. The right ventricle has normal systolc function. The cavity was normal. There is no increase in right ventricular wall thickness.  3. The mitral valve is grossly normal.  4. The tricuspid valve was grossly normal.  5. The aortic valve is grossly normal.  6. The aortic root is normal in size and structure.  7. The interatrial septum was not well visualized.     03/2019 nuclear stress test There was no ST segment deviation noted during stress. No T wave inversion was noted during stress. Defect 1: There is a medium defect of mild severity present in the basal inferior location. Defect 2: There is a small defect of mild severity present in the apex location. This is a high risk study. The left ventricular ejection fraction is severely decreased (<30%). Nuclear stress EF: 25%.   Assessment and Plan  1. HFpEF - LVEF has normalized, ongonig diastolic dysfunction - no recent  symptoms, continue current meds.      2. HTN -bp is at goal, continue current therapy     3. Palpitations - previous monitor with benign ectopy -EKG today NSR - symptoms have resovled on coreg , continue current therapy  F/u 1year      Laurann Pollock, M.D.

## 2023-11-22 NOTE — Patient Instructions (Signed)
 Medication Instructions:  Your physician recommends that you continue on your current medications as directed. Please refer to the Current Medication list given to you today.  *If you need a refill on your cardiac medications before your next appointment, please call your pharmacy*   Lab Work: None If you have labs (blood work) drawn today and your tests are completely normal, you will receive your results only by: MyChart Message (if you have MyChart) OR A paper copy in the mail If you have any lab test that is abnormal or we need to change your treatment, we will call you to review the results.   Testing/Procedures: None   Follow-Up: At [facility], you and your health needs are our priority.  As part of our continuing mission to provide you with exceptional heart care, we have created designated Provider Care Teams.  These Care Teams include your primary Cardiologist (physician) and Advanced Practice Providers (APPs -  Physician Assistants and Nurse Practitioners) who all work together to provide you with the care you need, when you need it.  We recommend signing up for the patient portal called "MyChart".  Sign up information is provided on this After Visit Summary.  MyChart is used to connect with patients for Virtual Visits (Telemedicine).  Patients are able to view lab/test results, encounter notes, upcoming appointments, etc.  Non-urgent messages can be sent to your provider as well.   To learn more about what you can do with MyChart, go to [email or web address].    Your next appointment:   1 year(s)  Provider:   You may see Dina Rich, MD or one of the following Advanced Practice Providers on your designated Care Team:   Randall An, PA-C  Jacolyn Reedy, [place]     Other Instructions

## 2023-11-28 ENCOUNTER — Other Ambulatory Visit: Payer: Self-pay | Admitting: Cardiology

## 2024-01-18 DIAGNOSIS — E119 Type 2 diabetes mellitus without complications: Secondary | ICD-10-CM | POA: Diagnosis not present

## 2024-01-18 DIAGNOSIS — I10 Essential (primary) hypertension: Secondary | ICD-10-CM | POA: Diagnosis not present

## 2024-01-24 DIAGNOSIS — E782 Mixed hyperlipidemia: Secondary | ICD-10-CM | POA: Diagnosis not present

## 2024-01-24 DIAGNOSIS — I10 Essential (primary) hypertension: Secondary | ICD-10-CM | POA: Diagnosis not present

## 2024-01-24 DIAGNOSIS — E1165 Type 2 diabetes mellitus with hyperglycemia: Secondary | ICD-10-CM | POA: Diagnosis not present

## 2024-01-24 DIAGNOSIS — I110 Hypertensive heart disease with heart failure: Secondary | ICD-10-CM | POA: Diagnosis not present

## 2024-01-24 DIAGNOSIS — I5022 Chronic systolic (congestive) heart failure: Secondary | ICD-10-CM | POA: Diagnosis not present

## 2024-01-25 ENCOUNTER — Ambulatory Visit: Payer: BC Managed Care – PPO | Admitting: Orthopedic Surgery

## 2024-01-26 ENCOUNTER — Encounter (INDEPENDENT_AMBULATORY_CARE_PROVIDER_SITE_OTHER): Payer: Self-pay | Admitting: *Deleted

## 2024-02-01 ENCOUNTER — Other Ambulatory Visit (INDEPENDENT_AMBULATORY_CARE_PROVIDER_SITE_OTHER): Payer: Self-pay

## 2024-02-01 ENCOUNTER — Ambulatory Visit: Payer: BC Managed Care – PPO | Admitting: Orthopedic Surgery

## 2024-02-01 VITALS — BP 110/76 | HR 101 | Ht 64.5 in | Wt 290.0 lb

## 2024-02-01 DIAGNOSIS — M1711 Unilateral primary osteoarthritis, right knee: Secondary | ICD-10-CM

## 2024-02-01 NOTE — Progress Notes (Signed)
   BP 110/76   Pulse (!) 101   Ht 5' 4.5" (1.638 m)   Wt 290 lb (131.5 kg)   BMI 49.01 kg/m   Body mass index is 49.01 kg/m.  Chief Complaint  Patient presents with   Knee Pain    R    Encounter Diagnosis  Name Primary?   Localized osteoarthritis of right knee Yes    Unchanged on and off since last visit no triggers that he's noticed.

## 2024-02-01 NOTE — Progress Notes (Signed)
 Chief Complaint  Patient presents with   Knee Pain    R    63 year old male with osteoarthritis of right and left knee followed by Dr. Wyline Mood for congestive heart failure  Currently takes hydrocodone for knee pain with good relief  Does have some throbbing pain at night.  He uses ice for swelling  He still ambulatory without assistive device  His weight today was 290 BMI was 49  Exam findings include 0-110 right knee 0-115 left knee Right knee is tender on the medial side and is in varus gait no noticeable thrust  DG Knee AP/LAT W/Sunrise Right Result Date: 02/01/2024 X-ray report right knee 3 views Osteoarthritis Alignment right knee varus with bone-on-bone disease, trending varus left knee as well.  We noted on the medial joint line of the right knee we see bone-on-bone contact osteophyte formation with some osteophyte formation at the patellofemoral joint as well especially on the medial side Impression osteoarthritis both knees right worse than left grade 4 on the right 3 on the left     Assessment and plan 63 year old male with high BMI congestive heart failure.  Stable at this time with hydrocodone  Patient will need knee replacement in the future.  His weight needs to be about 275 pounds  Follow-up year repeat x-rays both knees

## 2024-05-22 ENCOUNTER — Other Ambulatory Visit: Payer: Self-pay | Admitting: Orthopedic Surgery

## 2024-05-22 DIAGNOSIS — S8001XD Contusion of right knee, subsequent encounter: Secondary | ICD-10-CM

## 2024-05-22 DIAGNOSIS — M1711 Unilateral primary osteoarthritis, right knee: Secondary | ICD-10-CM

## 2024-06-17 ENCOUNTER — Other Ambulatory Visit: Payer: Self-pay | Admitting: Orthopedic Surgery

## 2024-06-17 DIAGNOSIS — S8001XD Contusion of right knee, subsequent encounter: Secondary | ICD-10-CM

## 2024-06-17 DIAGNOSIS — M1711 Unilateral primary osteoarthritis, right knee: Secondary | ICD-10-CM

## 2024-07-13 ENCOUNTER — Other Ambulatory Visit: Payer: Self-pay | Admitting: Orthopedic Surgery

## 2024-07-13 DIAGNOSIS — M1711 Unilateral primary osteoarthritis, right knee: Secondary | ICD-10-CM

## 2024-07-13 DIAGNOSIS — S8001XD Contusion of right knee, subsequent encounter: Secondary | ICD-10-CM

## 2024-07-24 DIAGNOSIS — Z125 Encounter for screening for malignant neoplasm of prostate: Secondary | ICD-10-CM | POA: Diagnosis not present

## 2024-07-24 DIAGNOSIS — B351 Tinea unguium: Secondary | ICD-10-CM | POA: Diagnosis not present

## 2024-07-24 DIAGNOSIS — E1165 Type 2 diabetes mellitus with hyperglycemia: Secondary | ICD-10-CM | POA: Diagnosis not present

## 2024-07-24 DIAGNOSIS — I10 Essential (primary) hypertension: Secondary | ICD-10-CM | POA: Diagnosis not present

## 2024-07-30 DIAGNOSIS — E1165 Type 2 diabetes mellitus with hyperglycemia: Secondary | ICD-10-CM | POA: Diagnosis not present

## 2024-07-30 DIAGNOSIS — I110 Hypertensive heart disease with heart failure: Secondary | ICD-10-CM | POA: Diagnosis not present

## 2024-07-30 DIAGNOSIS — E782 Mixed hyperlipidemia: Secondary | ICD-10-CM | POA: Diagnosis not present

## 2024-07-30 DIAGNOSIS — R718 Other abnormality of red blood cells: Secondary | ICD-10-CM | POA: Diagnosis not present

## 2024-07-30 DIAGNOSIS — I5022 Chronic systolic (congestive) heart failure: Secondary | ICD-10-CM | POA: Diagnosis not present

## 2024-07-30 DIAGNOSIS — I10 Essential (primary) hypertension: Secondary | ICD-10-CM | POA: Diagnosis not present

## 2024-07-30 DIAGNOSIS — Z0001 Encounter for general adult medical examination with abnormal findings: Secondary | ICD-10-CM | POA: Diagnosis not present

## 2024-08-02 ENCOUNTER — Other Ambulatory Visit: Payer: Self-pay | Admitting: Cardiology

## 2024-08-05 ENCOUNTER — Encounter (INDEPENDENT_AMBULATORY_CARE_PROVIDER_SITE_OTHER): Payer: Self-pay | Admitting: *Deleted

## 2024-08-13 ENCOUNTER — Other Ambulatory Visit: Payer: Self-pay | Admitting: Orthopedic Surgery

## 2024-08-13 DIAGNOSIS — M1711 Unilateral primary osteoarthritis, right knee: Secondary | ICD-10-CM

## 2024-08-13 DIAGNOSIS — S8001XD Contusion of right knee, subsequent encounter: Secondary | ICD-10-CM

## 2024-09-04 ENCOUNTER — Telehealth: Payer: Self-pay

## 2024-09-04 NOTE — Telephone Encounter (Signed)
Ok to schedule.  Room : any   Thanks,  Vista Lawman, MD Gastroenterology and Hepatology [facility]

## 2024-09-04 NOTE — Telephone Encounter (Signed)
 Who is your primary care physician: Dr.Zack Shona  Reasons for the colonoscopy: screening  Have you had a colonoscopy before?  Yes 2015  Do you have family history of colon cancer? no  Previous colonoscopy with polyps removed? yes  Do you have a history colorectal cancer?   no  Are you diabetic? If yes, Type 1 or Type 2?    Yes type 1  Do you have a prosthetic or mechanical heart valve? no  Do you have a pacemaker/defibrillator?   no  Have you had endocarditis/atrial fibrillation? no  Have you had joint replacement within the last 12 months?  no  Do you tend to be constipated or have to use laxatives? yes  Do you have any history of drugs or alchohol?  no  Do you use supplemental oxygen?  no  Have you had a stroke or heart attack within the last 6 months? no  Do you take weight loss medication?  no    Do you take any blood-thinning medications such as: (aspirin , warfarin, Plavix, Aggrenox)  yes  If yes we need the name, milligram, dosage and who is prescribing doctor Naproxen  Current Outpatient Medications on File Prior to Visit  Medication Sig Dispense Refill   allopurinol  (ZYLOPRIM ) 300 MG tablet Take 300 mg by mouth daily.     carvedilol  (COREG ) 12.5 MG tablet TAKE 1 TABLET BY MOUTH TWICE DAILY WITH A MEAL 180 tablet 0   furosemide  (LASIX ) 20 MG tablet Take 1 tablet by mouth once daily 90 tablet 0   gabapentin  (NEURONTIN ) 300 MG capsule Take 300 mg by mouth 3 (three) times daily.      HYDROcodone -acetaminophen  (NORCO/VICODIN) 5-325 MG tablet Take 1 tablet by mouth every 6 (six) hours as needed for severe pain.     MOUNJARO 12.5 MG/0.5ML Pen Inject 12.5 mg into the skin once a week. Sundays     naproxen  (NAPROSYN ) 500 MG tablet Take 1 tablet by mouth twice daily 60 tablet 5   olmesartan  (BENICAR ) 40 MG tablet Take 1 tablet (40 mg total) by mouth daily.     potassium chloride  (MICRO-K ) 10 MEQ CR capsule Take 10 mEq by mouth 2 (two) times daily.     pravastatin   (PRAVACHOL ) 40 MG tablet Take 40 mg by mouth every morning.      No current facility-administered medications on file prior to visit.    No Known Allergies   Pharmacy: [facility]  Primary Insurance Name: Insurance not listed on triage sheet  Best number where you can be reached: [phone]

## 2024-09-06 ENCOUNTER — Encounter (INDEPENDENT_AMBULATORY_CARE_PROVIDER_SITE_OTHER): Payer: Self-pay | Admitting: *Deleted

## 2024-09-06 MED ORDER — PEG 3350-KCL-NA BICARB-NACL 420 G PO SOLR: 4000.0000 mL | Freq: Once | ORAL | 0 refills | Status: AC

## 2024-09-06 NOTE — Telephone Encounter (Signed)
 Referral completed, [facility] apt letter sent to PCP

## 2024-09-06 NOTE — Telephone Encounter (Signed)
 Spoke with pt. He has been scheduled for 12/4. Aware will mail instructions and send rx for prep to pharmacy. He is aware he will get a pre-op phone call a few days prior to procedure with arrival time.

## 2024-09-06 NOTE — Addendum Note (Signed)
 Addended by: JEANELL GRAEME RAMAN on: 09/06/2024 09:40 AM   Modules accepted: Orders

## 2024-09-17 ENCOUNTER — Other Ambulatory Visit: Payer: Self-pay

## 2024-09-17 ENCOUNTER — Ambulatory Visit (INDEPENDENT_AMBULATORY_CARE_PROVIDER_SITE_OTHER): Admitting: Physician Assistant

## 2024-09-17 DIAGNOSIS — M25561 Pain in right knee: Secondary | ICD-10-CM

## 2024-09-17 NOTE — Progress Notes (Signed)
 Office Visit Note   Patient: Roberto Guerra           Date of Birth: Mar 16, 1961           MRN: [id number] Visit Date: 09/17/2024              Requested by: Shona Norleen PEDLAR, MD [address] [place],  [place] [address] PCP: Shona Norleen PEDLAR, MD  Chief Complaint  Patient presents with   Right Knee - Pain      HPI: Patient is a pleasant 63 year old gentleman who is a patient of Dr. Areatha.  Comes in today after a fall onto his night knee 3 weeks ago.  Of note he does have a history of advanced arthritis in his right knee and Dr. Margrette had told him that at some point he would need knee replacement.  He needs to have his weight at 275.  He comes in today requesting a second opinion regarding knee replacement.  Assessment & Plan: Visit Diagnoses:  1. Acute pain of right knee     Plan: I had a long discussion with the patient I told him I be happy to refer him to one of our surgeons here.  However I did discuss with him recovery risks and discussed with him that knee replacement is a personal decision to be weighed against current health problems current loss of activity of daily living that he enjoys and night pain.  He says he does not need to be referred that he is happy with the information again today may follow-up as needed  Follow-Up Instructions: No follow-ups on file.   Ortho Exam  Patient is alert, oriented, no adenopathy, well-dressed, normal affect, normal respiratory effort. Right knee no effusion no erythema compartments are soft and compressible he is neurovascular intact no evidence of crepitus or fracture    Imaging: No results found. No images are attached to the encounter.  Labs: Lab Results  Component Value Date   HGBA1C 6.1 (H) 09/24/2023   HGBA1C 6.8 (H) 06/10/2019   HGBA1C 6.6 (H) 10/25/2018   LABURIC 9.6 (H) 04/11/2009   LABURIC 7.7 11/28/2008   LABURIC 9.4 (H) 09/22/2007   REPTSTATUS 04/04/2019 FINAL 03/30/2019   CULT  03/30/2019    NO GROWTH  5 DAYS Performed at [facility], [address], [place], [place] [address]      Lab Results  Component Value Date   ALBUMIN 3.0 (L) 09/25/2023   ALBUMIN 3.2 (L) 03/31/2019   ALBUMIN 3.9 03/23/2019    Lab Results  Component Value Date   MG 2.1 09/25/2023   MG 2.0 06/10/2019   MG 1.9 07/01/2017   No results found for: VD25OH  No results found for: PREALBUMIN    Latest Ref Rng & Units 09/26/2023    4:31 AM 09/25/2023    4:45 AM 09/24/2023   10:46 AM  CBC EXTENDED  WBC 4.0 - 10.5 K/uL 8.2  8.5  7.6   RBC 4.22 - 5.81 MIL/uL 3.93  4.00  4.02   Hemoglobin 13.0 - 17.0 g/dL 88.0  87.9  87.5   HCT 39.0 - 52.0 % 38.1  38.4  38.6   Platelets 150 - 400 K/uL 171  166  170      There is no height or weight on file to calculate BMI.  Orders:  Orders Placed This Encounter  Procedures   XR KNEE 3 VIEW RIGHT   No orders of the defined types were placed in  this encounter.    Procedures: No procedures performed  Clinical Data: No additional findings.  ROS:  All other systems negative, except as noted in the HPI. Review of Systems  Objective: Vital Signs: There were no vitals taken for this visit.  Specialty Comments:  No specialty comments available.  PMFS History: Patient Active Problem List   Diagnosis Date Noted   Cellulitis of face 09/24/2023   Phlegmon 09/24/2023   HFrEF (heart failure with reduced ejection fraction) (HCC) 09/24/2023   Type 2 diabetes mellitus with hyperglycemia (HCC) 09/24/2023   Morbid obesity with BMI of 50.0-59.9, adult (HCC) 09/24/2023   Otitis externa of left ear 09/23/2023   Atypical chest pain 06/10/2019   Dyspnea 03/30/2019   Tachycardia 03/30/2019   Neuralgia of lower extremity 08/15/2012   ERECTILE DYSFUNCTION 08/29/2008   CERUMEN IMPACTION, BILATERAL 06/27/2008   GOUT 09/21/2007   Mixed hyperlipidemia 10/12/2006   OBESITY NOS 10/12/2006   Essential hypertension 10/12/2006   GERD 10/12/2006   Diaphragmatic hernia  10/12/2006   Past Medical History:  Diagnosis Date   CHF (congestive heart failure) (HCC)    a. EF 40-45% in 03/2019 with NST showing no reversible ischemia   Diabetes mellitus without complication (HCC)    Gout    Hyperlipidemia    Hypertension    PNA (pneumonia)     Family History  Problem Relation Age of Onset   Diabetes Other    Diabetes Sister    Colon cancer Neg Hx     Past Surgical History:  Procedure Laterality Date   CATARACT EXTRACTION W/PHACO Left 10/29/2018   Procedure: CATARACT EXTRACTION PHACO AND INTRAOCULAR LENS PLACEMENT (IOC);  Surgeon: Perley Hamilton, MD;  Location: [facility];  Service: Ophthalmology;  Laterality: Left;  CDE: 43.37   CATARACT EXTRACTION W/PHACO Right 12/30/2022   Procedure: CATARACT EXTRACTION PHACO AND INTRAOCULAR LENS PLACEMENT (IOC);  Surgeon: Juli Blunt, MD;  Location: [facility];  Service: Ophthalmology;  Laterality: Right;  CDE: 3.17   COLONOSCOPY N/A 02/27/2014   Procedure: COLONOSCOPY;  Surgeon: Claudis RAYMOND Rivet, MD;  Location: [facility];  Service: Endoscopy;  Laterality: N/A;  830   NO PAST SURGERIES     Social History   Occupational History    Employer: [employer]  Tobacco Use   Smoking status: Former    Current packs/day: 0.00    Average packs/day: 0.3 packs/day for 1 year (0.3 ttl pk-yrs)    Types: Cigarettes    Start date: 11/30/1997    Quit date: 11/30/1998    Years since quitting: 25.8   Smokeless tobacco: Never  Vaping Use   Vaping status: Never Used  Substance and Sexual Activity   Alcohol use: No   Drug use: No   Sexual activity: Not on file

## 2024-10-07 ENCOUNTER — Encounter (HOSPITAL_COMMUNITY): Payer: Self-pay

## 2024-10-07 ENCOUNTER — Encounter (HOSPITAL_COMMUNITY)
Admission: RE | Admit: 2024-10-07 | Discharge: 2024-10-07 | Disposition: A | Discharge status: Home / Self Care | Source: Ambulatory Visit | Attending: Gastroenterology | Admitting: Gastroenterology

## 2024-10-07 ENCOUNTER — Other Ambulatory Visit: Payer: Self-pay

## 2024-10-07 NOTE — Progress Notes (Signed)
   10/07/24 0916  OBSTRUCTIVE SLEEP APNEA  Have you ever been diagnosed with sleep apnea through a sleep study? No  Do you snore loudly (loud enough to be heard through closed doors)?  0  Has anyone observed you stop breathing during your sleep? 0  Do you have, or are you being treated for high blood pressure? 1  BMI more than 35 kg/m2? 1  Age > 50 (1-yes) 1  Neck circumference greater than:Male 16 inches or larger, Male 17inches or larger? 0  Male Gender (Yes=1) 1  Obstructive Sleep Apnea Score 4  Score 5 or greater  Results sent to PCP

## 2024-10-07 NOTE — Pre-Procedure Instructions (Signed)
 Sleep apnea screening sent to Dr Shona, PCP.

## 2024-10-07 NOTE — Pre-Procedure Instructions (Signed)
 Attempted pre-op phone call. Left VM for him to call us back.

## 2024-10-10 ENCOUNTER — Ambulatory Visit (HOSPITAL_COMMUNITY)

## 2024-10-10 ENCOUNTER — Ambulatory Visit (HOSPITAL_COMMUNITY)
Admission: RE | Admit: 2024-10-10 | Discharge: 2024-10-10 | Disposition: A | Attending: Gastroenterology | Admitting: Gastroenterology

## 2024-10-10 ENCOUNTER — Encounter (HOSPITAL_COMMUNITY): Admission: RE | Disposition: A | Payer: Self-pay | Source: Home / Self Care | Attending: Gastroenterology

## 2024-10-10 ENCOUNTER — Other Ambulatory Visit: Payer: Self-pay

## 2024-10-10 ENCOUNTER — Encounter (HOSPITAL_COMMUNITY): Payer: Self-pay | Admitting: Gastroenterology

## 2024-10-10 DIAGNOSIS — E119 Type 2 diabetes mellitus without complications: Secondary | ICD-10-CM | POA: Diagnosis not present

## 2024-10-10 DIAGNOSIS — K635 Polyp of colon: Secondary | ICD-10-CM | POA: Diagnosis not present

## 2024-10-10 DIAGNOSIS — Z6841 Body Mass Index (BMI) 40.0 and over, adult: Secondary | ICD-10-CM | POA: Diagnosis not present

## 2024-10-10 DIAGNOSIS — D122 Benign neoplasm of ascending colon: Secondary | ICD-10-CM | POA: Diagnosis not present

## 2024-10-10 DIAGNOSIS — Z7985 Long-term (current) use of injectable non-insulin antidiabetic drugs: Secondary | ICD-10-CM | POA: Diagnosis not present

## 2024-10-10 DIAGNOSIS — E66813 Obesity, class 3: Secondary | ICD-10-CM | POA: Diagnosis not present

## 2024-10-10 DIAGNOSIS — Z1211 Encounter for screening for malignant neoplasm of colon: Secondary | ICD-10-CM | POA: Diagnosis not present

## 2024-10-10 DIAGNOSIS — K5730 Diverticulosis of large intestine without perforation or abscess without bleeding: Secondary | ICD-10-CM | POA: Diagnosis not present

## 2024-10-10 DIAGNOSIS — Z87891 Personal history of nicotine dependence: Secondary | ICD-10-CM | POA: Diagnosis not present

## 2024-10-10 DIAGNOSIS — I509 Heart failure, unspecified: Secondary | ICD-10-CM | POA: Diagnosis not present

## 2024-10-10 DIAGNOSIS — Z139 Encounter for screening, unspecified: Secondary | ICD-10-CM | POA: Diagnosis not present

## 2024-10-10 DIAGNOSIS — K648 Other hemorrhoids: Secondary | ICD-10-CM

## 2024-10-10 DIAGNOSIS — K219 Gastro-esophageal reflux disease without esophagitis: Secondary | ICD-10-CM | POA: Diagnosis not present

## 2024-10-10 DIAGNOSIS — D124 Benign neoplasm of descending colon: Secondary | ICD-10-CM | POA: Diagnosis not present

## 2024-10-10 DIAGNOSIS — G709 Myoneural disorder, unspecified: Secondary | ICD-10-CM | POA: Diagnosis not present

## 2024-10-10 DIAGNOSIS — I110 Hypertensive heart disease with heart failure: Secondary | ICD-10-CM | POA: Diagnosis not present

## 2024-10-10 HISTORY — PX: COLONOSCOPY: SHX5424

## 2024-10-10 HISTORY — PX: POLYPECTOMY: SHX149

## 2024-10-10 LAB — HM COLONOSCOPY

## 2024-10-10 LAB — GLUCOSE, CAPILLARY: Glucose-Capillary: 89 mg/dL (ref 70–99)

## 2024-10-10 SURGERY — COLONOSCOPY
Anesthesia: General

## 2024-10-10 MED ORDER — PROPOFOL 10 MG/ML IV BOLUS
INTRAVENOUS | Status: DC | PRN
  Administered 2024-10-10: 100 mg via INTRAVENOUS

## 2024-10-10 MED ORDER — LIDOCAINE 2% (20 MG/ML) 5 ML SYRINGE
INTRAMUSCULAR | Status: DC | PRN
  Administered 2024-10-10: 100 mg via INTRAVENOUS

## 2024-10-10 MED ORDER — LACTATED RINGERS IV SOLN: INTRAVENOUS | Status: DC | PRN

## 2024-10-10 MED ORDER — VASOPRESSIN 20 UNIT/ML IV SOLN
INTRAVENOUS | Status: DC | PRN
  Administered 2024-10-10 (×2): 1 [IU] via INTRAVENOUS

## 2024-10-10 MED ORDER — PROPOFOL 500 MG/50ML IV EMUL
INTRAVENOUS | Status: DC | PRN
  Administered 2024-10-10: 200 ug/kg/min via INTRAVENOUS

## 2024-10-10 MED ORDER — PHENYLEPHRINE 80 MCG/ML (10ML) SYRINGE FOR IV PUSH (FOR BLOOD PRESSURE SUPPORT)
PREFILLED_SYRINGE | INTRAVENOUS | Status: DC | PRN
  Administered 2024-10-10: 80 ug via INTRAVENOUS
  Administered 2024-10-10 (×2): 160 ug via INTRAVENOUS
  Administered 2024-10-10: 80 ug via INTRAVENOUS
  Administered 2024-10-10: 160 ug via INTRAVENOUS
  Administered 2024-10-10: 80 ug via INTRAVENOUS

## 2024-10-10 NOTE — Op Note (Signed)
 [facility] Patient Name: Roberto Guerra Procedure Date: 10/10/2024 8:20 AM MRN: [id number] Date of Birth: 04-24-1961 Attending MD: Deatrice Dine , MD, [id number] CSN: [id number] Age: 63 Admit Type: Outpatient Procedure:                Colonoscopy Indications:              Screening for colorectal malignant neoplasm Providers:                Deatrice Dine, MD, Harlene Lips, Dorcas Lenis, Technician Referring MD:              Medicines:                Monitored Anesthesia Care Complications:            No immediate complications. Estimated Blood Loss:     Estimated blood loss was minimal. Procedure:                Pre-Anesthesia Assessment:                           - Prior to the procedure, a History and Physical                            was performed, and patient medications and                            allergies were reviewed. The patient's tolerance of                            previous anesthesia was also reviewed. The risks                            and benefits of the procedure and the sedation                            options and risks were discussed with the patient.                            All questions were answered, and informed consent                            was obtained. Prior Anticoagulants: The patient has                            taken no anticoagulant or antiplatelet agents                            except for aspirin . ASA Grade Assessment: III - A                            patient with severe systemic disease. After  reviewing the risks and benefits, the patient was                            deemed in satisfactory condition to undergo the                            procedure.                           After obtaining informed consent, the colonoscope                            was passed under direct vision. Throughout the                            procedure, the patient's blood  pressure, pulse, and                            oxygen saturations were monitored continuously. The                            CF-[id number] ([id number]) Colon was introduced through                            the anus and advanced to the the cecum, identified                            by appendiceal orifice and ileocecal valve. The                            colonoscopy was performed without difficulty. The                            patient tolerated the procedure well. The quality                            of the bowel preparation was evaluated using the                            BBPS [facility] Bowel Preparation Scale) with scores                            of: Right Colon = 2 (minor amount of residual                            staining, small fragments of stool and/or opaque                            liquid, but mucosa seen well), Transverse Colon = 2                            (minor amount of residual staining, small fragments  of stool and/or opaque liquid, but mucosa seen                            well) and Left Colon = 2 (minor amount of residual                            staining, small fragments of stool and/or opaque                            liquid, but mucosa seen well). The total BBPS score                            equals 6. The ileocecal valve, appendiceal orifice,                            and rectum were photographed. Scope In: 8:37:08 AM Scope Out: 9:03:05 AM Scope Withdrawal Time: 0 hours 18 minutes 22 seconds  Total Procedure Duration: 0 hours 25 minutes 57 seconds  Findings:      Two sessile polyps were found in the descending colon and ascending       colon. The polyps were 3 to 5 mm in size. These polyps were removed with       a cold snare. Resection and retrieval were complete.      A few medium-mouthed diverticula were found in the left colon.      A small amount of stool was found in the entire colon, precluding       visualization.  Lavage of the area was performed using a large amount of       sterile water , resulting in clearance with good visualization.      Non-bleeding internal hemorrhoids were found during retroflexion. The       hemorrhoids were small. Impression:               - Two 3 to 5 mm polyps in the descending colon and                            in the ascending colon, removed with a cold snare.                            Resected and retrieved.                           - Diverticulosis in the left colon.                           - Stool in the entire examined colon.                           - Non-bleeding internal hemorrhoids. Moderate Sedation:      Per Anesthesia Care Recommendation:           - Patient has a contact number available for                            emergencies. The signs and symptoms of potential  delayed complications were discussed with the                            patient. Return to normal activities tomorrow.                            Written discharge instructions were provided to the                            patient.                           - Resume previous diet.                           - Continue present medications.                           - Await pathology results.                           - Repeat colonoscopy in 5 years for surveillance                            based on pathology results.                           - Return to primary care physician as previously                            scheduled. Procedure Code(s):        --- Professional ---                           [id number], Colonoscopy, flexible; with removal of                            tumor(s), polyp(s), or other lesion(s) by snare                            technique Diagnosis Code(s):        --- Professional ---                           Z12.11, Encounter for screening for malignant                            neoplasm of colon                           D12.4, Benign  neoplasm of descending colon                           D12.2, Benign neoplasm of ascending colon                           K64.8, Other hemorrhoids  K57.30, Diverticulosis of large intestine without                            perforation or abscess without bleeding CPT copyright 2022 American Medical Association. All rights reserved. The codes documented in this report are preliminary and upon coder review may  be revised to meet current compliance requirements. Deatrice Dine, MD Deatrice Dine, MD 10/10/2024 9:12:17 AM This report has been signed electronically. Number of Addenda: 0

## 2024-10-10 NOTE — Anesthesia Postprocedure Evaluation (Signed)
 Anesthesia Post Note  Patient: Roberto Guerra  Procedure(s) Performed: COLONOSCOPY POLYPECTOMY, INTESTINE  Patient location during evaluation: PACU Anesthesia Type: General Level of consciousness: awake and alert Pain management: pain level controlled Vital Signs Assessment: post-procedure vital signs reviewed and stable Respiratory status: spontaneous breathing, nonlabored ventilation, respiratory function stable and patient connected to nasal cannula oxygen Cardiovascular status: blood pressure returned to baseline and stable Postop Assessment: no apparent nausea or vomiting Anesthetic complications: no   No notable events documented.   Last Vitals:  Vitals:   10/10/24 0911 10/10/24 0914  BP: (!) 92/59 (!) 99/58  Pulse: 84   Resp: 16   Temp: 36.7 C   SpO2: 96%     Last Pain:  Vitals:   10/10/24 0914  TempSrc:   PainSc: 0-No pain                 Andrea Limes

## 2024-10-10 NOTE — Anesthesia Preprocedure Evaluation (Addendum)
 Anesthesia Evaluation  Patient identified by MRN, date of birth, ID band Patient awake    Reviewed: Allergy & Precautions, H&P , NPO status , Patient's Chart, lab work & pertinent test results  Airway Mallampati: III  TM Distance: >3 FB Neck ROM: Full    Dental no notable dental hx.    Pulmonary shortness of breath, pneumonia, former smoker   Pulmonary exam normal breath sounds clear to auscultation       Cardiovascular hypertension, +CHF  Normal cardiovascular exam Rhythm:Regular Rate:Normal  EF 9/20 55-60%   Neuro/Psych  Neuromuscular disease  negative psych ROS   GI/Hepatic Neg liver ROS,GERD  ,,  Endo/Other  diabetes  Class 3 obesity  Renal/GU negative Renal ROS  negative genitourinary   Musculoskeletal negative musculoskeletal ROS (+)    Abdominal   Peds negative pediatric ROS (+)  Hematology negative hematology ROS (+)   Anesthesia Other Findings   Reproductive/Obstetrics negative OB ROS                              Anesthesia Physical Anesthesia Plan  ASA: 3  Anesthesia Plan: General   Post-op Pain Management:    Induction: Intravenous  PONV Risk Score and Plan:   Airway Management Planned: Nasal Cannula  Additional Equipment:   Intra-op Plan:   Post-operative Plan:   Informed Consent: I have reviewed the patients History and Physical, chart, labs and discussed the procedure including the risks, benefits and alternatives for the proposed anesthesia with the patient or authorized representative who has indicated his/her understanding and acceptance.     Dental advisory given  Plan Discussed with: CRNA  Anesthesia Plan Comments:          Anesthesia Quick Evaluation

## 2024-10-10 NOTE — Discharge Instructions (Signed)

## 2024-10-10 NOTE — Transfer of Care (Signed)
 Immediate Anesthesia Transfer of Care Note  Patient: Roberto Guerra  Procedure(s) Performed: COLONOSCOPY POLYPECTOMY, INTESTINE  Patient Location: Short Stay  Anesthesia Type:General  Level of Consciousness: awake, alert , oriented, and patient cooperative  Airway & Oxygen Therapy: Patient Spontanous Breathing  Post-op Assessment: Report given to RN, Post -op Vital signs reviewed and stable, and Patient moving all extremities X 4  Post vital signs: Reviewed and stable  Last Vitals:  Vitals Value Taken Time  BP 92/59 10/10/24 09:11  Temp 36.7 C 10/10/24 09:11  Pulse 84 10/10/24 09:11  Resp 16 10/10/24 09:11  SpO2 96 % 10/10/24 09:11    Last Pain:  Vitals:   10/10/24 0911  TempSrc: Oral  PainSc: 0-No pain         Complications: No notable events documented.

## 2024-10-10 NOTE — H&P (Signed)
 Primary Care Physician:  Shona Norleen PEDLAR, MD Primary Gastroenterologist:  Dr. Cinderella  Pre-Procedure History & Physical: HPI:  Roberto Guerra is a 63 y.o. male is here for a colonoscopy for colon cancer screening purposes.  Patient denies any family history of colorectal cancer.  No melena or hematochezia.  No abdominal pain or unintentional weight loss.  No change in bowel habits.  Overall feels well from a GI standpoint.  Past Medical History:  Diagnosis Date   CHF (congestive heart failure) (HCC)    a. EF 40-45% in 03/2019 with NST showing no reversible ischemia   Diabetes mellitus without complication (HCC)    Gout    Hyperlipidemia    Hypertension    PNA (pneumonia)     Past Surgical History:  Procedure Laterality Date   CATARACT EXTRACTION W/PHACO Left 10/29/2018   Procedure: CATARACT EXTRACTION PHACO AND INTRAOCULAR LENS PLACEMENT (IOC);  Surgeon: Perley Hamilton, MD;  Location: [facility];  Service: Ophthalmology;  Laterality: Left;  CDE: 43.37   CATARACT EXTRACTION W/PHACO Right 12/30/2022   Procedure: CATARACT EXTRACTION PHACO AND INTRAOCULAR LENS PLACEMENT (IOC);  Surgeon: Juli Blunt, MD;  Location: [facility];  Service: Ophthalmology;  Laterality: Right;  CDE: 3.17   COLONOSCOPY N/A 02/27/2014   Procedure: COLONOSCOPY;  Surgeon: Claudis RAYMOND Rivet, MD;  Location: [facility];  Service: Endoscopy;  Laterality: N/A;  830   NO PAST SURGERIES      Prior to Admission medications   Medication Sig Start Date End Date Taking? Authorizing Provider  allopurinol  (ZYLOPRIM ) 300 MG tablet Take 300 mg by mouth daily.   Yes [provider]  carvedilol  (COREG ) 12.5 MG tablet TAKE 1 TABLET BY MOUTH TWICE DAILY WITH A MEAL 03/26/21  Yes Branch, Dorn FALCON, MD  furosemide  (LASIX ) 20 MG tablet Take 1 tablet by mouth once daily 08/05/24  Yes Branch, Dorn FALCON, MD  gabapentin  (NEURONTIN ) 300 MG capsule Take 300 mg by mouth 3 (three) times daily.    Yes [provider]  olmesartan   (BENICAR ) 40 MG tablet Take 1 tablet (40 mg total) by mouth daily. 11/05/20  Yes Strader, Brittany M, PA-C  potassium chloride  (MICRO-K ) 10 MEQ CR capsule Take 10 mEq by mouth 2 (two) times daily. 08/07/23  Yes [provider]  pravastatin  (PRAVACHOL ) 40 MG tablet Take 40 mg by mouth every morning.    Yes [provider]  HYDROcodone -acetaminophen  (NORCO/VICODIN) 5-325 MG tablet Take 1 tablet by mouth every 6 (six) hours as needed for severe pain.    [provider]  MOUNJARO 12.5 MG/0.5ML Pen Inject 12.5 mg into the skin once a week. Sundays 11/21/22   [provider]  naproxen  (NAPROSYN ) 500 MG tablet Take 1 tablet by mouth twice daily 08/13/24   Margrette Taft BRAVO, MD    Allergies as of 09/06/2024   (No Known Allergies)    Family History  Problem Relation Age of Onset   Diabetes Other    Diabetes Sister    Colon cancer Neg Hx     Social History   Socioeconomic History   Marital status: Married    Spouse name: Not on file   Number of children: Not on file   Years of education: 11   Highest education level: Not on file  Occupational History    Employer: [employer]  Tobacco Use   Smoking status: Former    Current packs/day: 0.00    Average packs/day: 0.3 packs/day for 1 year (0.3 ttl pk-yrs)  Types: Cigarettes    Start date: 11/30/1997    Quit date: 11/30/1998    Years since quitting: 25.8   Smokeless tobacco: Never  Vaping Use   Vaping status: Never Used  Substance and Sexual Activity   Alcohol use: No   Drug use: No   Sexual activity: Not on file  Other Topics Concern   Not on file  Social History Narrative   Lives with wife.  Daughter at home.  Supervisor.     Social Drivers of Corporate Investment Banker Strain: Not on file  Food Insecurity: No Food Insecurity (09/23/2023)   Hunger Vital Sign    Worried About Running Out of Food in the Last Year: Never true    Ran Out of Food in the Last Year: Never true   Transportation Needs: No Transportation Needs (09/23/2023)   PRAPARE - Administrator, Civil Service (Medical): No    Lack of Transportation (Non-Medical): No  Physical Activity: Not on file  Stress: Not on file  Social Connections: Not on file  Intimate Partner Violence: Not At Risk (09/23/2023)   Humiliation, Afraid, Rape, and Kick questionnaire    Fear of Current or Ex-Partner: No    Emotionally Abused: No    Physically Abused: No    Sexually Abused: No    Review of Systems: See HPI, otherwise negative ROS  Physical Exam: Vital signs in last 24 hours: Temp:  [98.2 F (36.8 C)] 98.2 F (36.8 C) (12/04 0706) Pulse Rate:  [86] 86 (12/04 0706) Resp:  [12] 12 (12/04 0706) BP: (126)/(72) 126/72 (12/04 0706) SpO2:  [100 %] 100 % (12/04 0706) Weight:  [129.3 kg] 129.3 kg (12/04 0706)   General:   Alert,  Well-developed, well-nourished, pleasant and cooperative in NAD Head:  Normocephalic and atraumatic. Eyes:  Sclera clear, no icterus.   Conjunctiva pink. Ears:  Normal auditory acuity. Nose:  No deformity, discharge,  or lesions. Msk:  Symmetrical without gross deformities. Normal posture. Extremities:  Without clubbing or edema. Neurologic:  Alert and  oriented x4;  grossly normal neurologically. Skin:  Intact without significant lesions or rashes. Psych:  Alert and cooperative. Normal mood and affect.  Impression/Plan: Roberto Guerra is here for a colonoscopy to be performed for colon cancer screening purposes.  The risks of the procedure including infection, bleed, or perforation as well as benefits, limitations, alternatives and imponderables have been reviewed with the patient. Questions have been answered. All parties agreeable.

## 2024-10-11 ENCOUNTER — Encounter (HOSPITAL_COMMUNITY): Payer: Self-pay | Admitting: Gastroenterology

## 2024-10-11 LAB — SURGICAL PATHOLOGY

## 2024-10-15 ENCOUNTER — Ambulatory Visit (INDEPENDENT_AMBULATORY_CARE_PROVIDER_SITE_OTHER): Payer: Self-pay | Admitting: Gastroenterology

## 2024-10-15 ENCOUNTER — Encounter (INDEPENDENT_AMBULATORY_CARE_PROVIDER_SITE_OTHER): Payer: Self-pay | Admitting: *Deleted

## 2024-10-17 NOTE — Progress Notes (Signed)
 5 yr TCS noted in recall Patient result letter mailed procedure note and pathology result faxed to PCP

## 2025-02-03 ENCOUNTER — Ambulatory Visit: Admitting: Orthopedic Surgery
